# Patient Record
Sex: Female | Born: 1970 | Race: White | Hispanic: No | Smoking: Current every day smoker
Health system: Southern US, Community
[De-identification: ages and names within clinical notes are randomized; demographics above are authoritative.]

## PROBLEM LIST (undated history)

## (undated) DIAGNOSIS — F1110 Opioid abuse, uncomplicated: Secondary | ICD-10-CM

## (undated) DIAGNOSIS — J40 Bronchitis, not specified as acute or chronic: Secondary | ICD-10-CM

## (undated) DIAGNOSIS — B1920 Unspecified viral hepatitis C without hepatic coma: Secondary | ICD-10-CM

---

## 2000-02-08 ENCOUNTER — Emergency Department (HOSPITAL_COMMUNITY): Admission: EM | Admit: 2000-02-08 | Discharge: 2000-02-08 | Payer: Self-pay | Admitting: *Deleted

## 2000-02-20 ENCOUNTER — Emergency Department (HOSPITAL_COMMUNITY): Admission: EM | Admit: 2000-02-20 | Discharge: 2000-02-20 | Payer: Self-pay | Admitting: Emergency Medicine

## 2000-09-12 ENCOUNTER — Emergency Department (HOSPITAL_COMMUNITY): Admission: EM | Admit: 2000-09-12 | Discharge: 2000-09-12 | Payer: Self-pay | Admitting: Emergency Medicine

## 2000-09-12 ENCOUNTER — Encounter: Payer: Self-pay | Admitting: Emergency Medicine

## 2001-09-19 ENCOUNTER — Encounter: Payer: Self-pay | Admitting: Emergency Medicine

## 2001-09-19 ENCOUNTER — Emergency Department (HOSPITAL_COMMUNITY): Admission: EM | Admit: 2001-09-19 | Discharge: 2001-09-19 | Payer: Self-pay | Admitting: Emergency Medicine

## 2002-06-03 ENCOUNTER — Emergency Department (HOSPITAL_COMMUNITY): Admission: EM | Admit: 2002-06-03 | Discharge: 2002-06-03 | Payer: Self-pay | Admitting: Emergency Medicine

## 2002-06-13 ENCOUNTER — Emergency Department (HOSPITAL_COMMUNITY): Admission: EM | Admit: 2002-06-13 | Discharge: 2002-06-13 | Payer: Self-pay | Admitting: Emergency Medicine

## 2005-07-04 ENCOUNTER — Inpatient Hospital Stay (HOSPITAL_COMMUNITY): Admission: AD | Admit: 2005-07-04 | Discharge: 2005-07-04 | Payer: Self-pay | Admitting: Obstetrics and Gynecology

## 2005-09-09 ENCOUNTER — Emergency Department (HOSPITAL_COMMUNITY): Admission: EM | Admit: 2005-09-09 | Discharge: 2005-09-09 | Payer: Self-pay | Admitting: Emergency Medicine

## 2005-10-15 ENCOUNTER — Emergency Department (HOSPITAL_COMMUNITY): Admission: EM | Admit: 2005-10-15 | Discharge: 2005-10-15 | Payer: Self-pay | Admitting: Emergency Medicine

## 2006-03-30 ENCOUNTER — Emergency Department (HOSPITAL_COMMUNITY): Admission: EM | Admit: 2006-03-30 | Discharge: 2006-03-30 | Payer: Self-pay | Admitting: Emergency Medicine

## 2006-04-01 ENCOUNTER — Emergency Department (HOSPITAL_COMMUNITY): Admission: EM | Admit: 2006-04-01 | Discharge: 2006-04-01 | Payer: Self-pay | Admitting: Emergency Medicine

## 2006-04-04 ENCOUNTER — Emergency Department (HOSPITAL_COMMUNITY): Admission: EM | Admit: 2006-04-04 | Discharge: 2006-04-04 | Payer: Self-pay | Admitting: Family Medicine

## 2006-05-18 ENCOUNTER — Emergency Department (HOSPITAL_COMMUNITY): Admission: EM | Admit: 2006-05-18 | Discharge: 2006-05-18 | Payer: Self-pay | Admitting: Emergency Medicine

## 2006-08-26 ENCOUNTER — Emergency Department (HOSPITAL_COMMUNITY): Admission: EM | Admit: 2006-08-26 | Discharge: 2006-08-27 | Payer: Self-pay | Admitting: *Deleted

## 2007-01-14 ENCOUNTER — Emergency Department (HOSPITAL_COMMUNITY): Admission: EM | Admit: 2007-01-14 | Discharge: 2007-01-14 | Payer: Self-pay | Admitting: *Deleted

## 2007-02-05 ENCOUNTER — Emergency Department (HOSPITAL_COMMUNITY): Admission: EM | Admit: 2007-02-05 | Discharge: 2007-02-05 | Payer: Self-pay | Admitting: Emergency Medicine

## 2008-06-21 ENCOUNTER — Emergency Department (HOSPITAL_COMMUNITY): Admission: EM | Admit: 2008-06-21 | Discharge: 2008-06-21 | Payer: Self-pay | Admitting: Emergency Medicine

## 2009-03-09 ENCOUNTER — Emergency Department (HOSPITAL_COMMUNITY): Admission: EM | Admit: 2009-03-09 | Discharge: 2009-03-09 | Payer: Self-pay | Admitting: Emergency Medicine

## 2010-03-28 ENCOUNTER — Emergency Department (HOSPITAL_COMMUNITY)
Admission: EM | Admit: 2010-03-28 | Discharge: 2010-03-28 | Disposition: A | Discharge status: Home / Self Care | Payer: Self-pay | Attending: Emergency Medicine | Admitting: Emergency Medicine

## 2010-03-28 DIAGNOSIS — L989 Disorder of the skin and subcutaneous tissue, unspecified: Secondary | ICD-10-CM | POA: Insufficient documentation

## 2010-03-28 DIAGNOSIS — Z8619 Personal history of other infectious and parasitic diseases: Secondary | ICD-10-CM | POA: Insufficient documentation

## 2010-04-09 LAB — PORPHYRINS, FRACTIONATION-PLASMA

## 2010-04-12 ENCOUNTER — Emergency Department (HOSPITAL_COMMUNITY)
Admission: EM | Admit: 2010-04-12 | Discharge: 2010-04-14 | Disposition: A | Discharge status: Home / Self Care | Payer: Self-pay | Attending: Emergency Medicine | Admitting: Emergency Medicine

## 2010-04-12 DIAGNOSIS — L989 Disorder of the skin and subcutaneous tissue, unspecified: Secondary | ICD-10-CM | POA: Insufficient documentation

## 2010-04-12 DIAGNOSIS — F1910 Other psychoactive substance abuse, uncomplicated: Secondary | ICD-10-CM | POA: Insufficient documentation

## 2010-04-12 DIAGNOSIS — Z8619 Personal history of other infectious and parasitic diseases: Secondary | ICD-10-CM | POA: Insufficient documentation

## 2010-04-12 LAB — DIFFERENTIAL
Basophils Relative: 1 % (ref 0–1)
Eosinophils Absolute: 0.2 10*3/uL (ref 0.0–0.7)
Lymphs Abs: 2.6 10*3/uL (ref 0.7–4.0)
Neutro Abs: 3.5 10*3/uL (ref 1.7–7.7)
Neutrophils Relative %: 49 % (ref 43–77)

## 2010-04-12 LAB — URINALYSIS, ROUTINE W REFLEX MICROSCOPIC
Bilirubin Urine: NEGATIVE
Ketones, ur: NEGATIVE mg/dL
Nitrite: NEGATIVE
Protein, ur: NEGATIVE mg/dL
pH: 6 (ref 5.0–8.0)

## 2010-04-12 LAB — CBC
Hemoglobin: 13.5 g/dL (ref 12.0–15.0)
MCV: 91 fL (ref 78.0–100.0)
Platelets: 361 10*3/uL (ref 150–400)
RBC: 4.57 MIL/uL (ref 3.87–5.11)
WBC: 7.1 10*3/uL (ref 4.0–10.5)

## 2010-04-12 LAB — COMPREHENSIVE METABOLIC PANEL
AST: 21 U/L (ref 0–37)
Albumin: 3.7 g/dL (ref 3.5–5.2)
Alkaline Phosphatase: 68 U/L (ref 39–117)
CO2: 31 mEq/L (ref 19–32)
Chloride: 102 mEq/L (ref 96–112)
GFR calc Af Amer: >60 mL/min (ref >60)
Potassium: 3.7 mEq/L (ref 3.5–5.1)
Total Bilirubin: 0.2 mg/dL — ABNORMAL LOW (ref 0.3–1.2)

## 2010-04-12 LAB — RAPID URINE DRUG SCREEN, HOSP PERFORMED
Cocaine: POSITIVE — AB
Tetrahydrocannabinol: POSITIVE — AB

## 2010-04-13 DIAGNOSIS — F1110 Opioid abuse, uncomplicated: Secondary | ICD-10-CM

## 2010-04-14 DIAGNOSIS — F1910 Other psychoactive substance abuse, uncomplicated: Secondary | ICD-10-CM

## 2010-04-14 NOTE — Consult Note (Signed)
  Christy Barnett, Christy Barnett NO.:  [id number]  MEDICAL RECORD NO.:  [id number]           PATIENT TYPE:  E  LOCATION:  WLED                         FACILITY:  [facility]  PHYSICIAN:  Anselm Jungling, MD  DATE OF BIRTH:  August 13, 1970  DATE OF CONSULTATION:  04/13/2010 DATE OF DISCHARGE:                                CONSULTATION   IDENTIFYING DATA AND REASON FOR EVALUATION:  The patient is a 40 year old homeless Caucasian female who presents to the emergency department requesting treatment for opiate dependence and polysubstance abuse.  HISTORY OF PRESENTING PROBLEMS:  The patient indicates that she is currently homeless.  She was released from prison in January and has been using heroin daily since.  She was going to a methadone clinic but was using heroin at the same time, and therefore apparently was dismissed.  She states that her last use of any opiate was 2 days ago. She is in withdrawal at this time.  Urine drug screen was also positive for marijuana and cocaine.  She apparently has a long history, all of her adult life, of substance dependence.  She has had various efforts at treatment in the past.  Our assessment team has tried to place her at [facility], but they have no beds available right now.  [facility] was also called but they not returned our calls.  MEDICAL HISTORY:  See emergency department notes.  MENTAL STATUS:  The patient is a well-nourished, normally-developed adult female who appears older than her chronological age.  She is disheveled and looks extremely unhealthy with discolored facial skin, appearing chronically ill.  She is alert, fully oriented and in full contact with reality.  She indicates that she does want treatment for heroin dependence.  She is agreeable to remaining in an inpatient setting to accomplish this.  IMPRESSION:  AXIS I:  Opiate dependence, polysubstance abuse. AXIS II:  Deferred. AXIS III:  See medical notes. AXIS IV:   Stressors, severe. AXIS V:  Global assessment of functioning, 40.  RECOMMENDATIONS:  The patient needs opiate detox.  At this point it is not clear where this can be accomplished.  [facility] has no beds available today.  And we are waiting from [facility] as described above.  She has been turned down by [facility].  For now, we will initiate the clonidine withdrawal protocol while we attempt to find an appropriate program for her elsewhere.     Anselm Jungling, MD     SPB/MEDQ  D:  04/13/2010  T:  04/13/2010  Job:  [id number]  Electronically Signed by Geralyn Flash MD on 04/14/2010 11:37:35 AM

## 2010-06-22 ENCOUNTER — Emergency Department (HOSPITAL_COMMUNITY)
Admission: EM | Admit: 2010-06-22 | Discharge: 2010-06-23 | Discharge status: Against Medical Advice | Payer: Self-pay | Attending: Emergency Medicine | Admitting: Emergency Medicine

## 2010-06-22 DIAGNOSIS — N61 Mastitis without abscess: Secondary | ICD-10-CM | POA: Insufficient documentation

## 2010-06-22 DIAGNOSIS — Z8619 Personal history of other infectious and parasitic diseases: Secondary | ICD-10-CM | POA: Insufficient documentation

## 2010-06-22 DIAGNOSIS — R509 Fever, unspecified: Secondary | ICD-10-CM | POA: Insufficient documentation

## 2010-06-22 LAB — DIFFERENTIAL
Basophils Relative: 1 % (ref 0–1)
Eosinophils Absolute: 0.1 10*3/uL (ref 0.0–0.7)
Eosinophils Relative: 2 % (ref 0–5)
Monocytes Absolute: 0.8 10*3/uL (ref 0.1–1.0)
Monocytes Relative: 11 % (ref 3–12)

## 2010-06-22 LAB — CBC
MCH: 29.3 pg (ref 26.0–34.0)
MCHC: 35 g/dL (ref 30.0–36.0)
Platelets: 319 10*3/uL (ref 150–400)
RDW: 12.8 % (ref 11.5–15.5)

## 2010-06-23 LAB — BASIC METABOLIC PANEL
CO2: 26 mEq/L (ref 19–32)
Calcium: 9.2 mg/dL (ref 8.4–10.5)
Creatinine, Ser: 0.79 mg/dL (ref 0.4–1.2)
GFR calc Af Amer: >60 mL/min (ref >60)

## 2010-06-25 LAB — WOUND CULTURE

## 2010-07-29 ENCOUNTER — Emergency Department (HOSPITAL_COMMUNITY)
Admission: EM | Admit: 2010-07-29 | Discharge: 2010-07-30 | Disposition: A | Discharge status: Other Acute Inpatient Hospital | Payer: Self-pay | Attending: Emergency Medicine | Admitting: Emergency Medicine

## 2010-07-29 DIAGNOSIS — F1910 Other psychoactive substance abuse, uncomplicated: Secondary | ICD-10-CM | POA: Insufficient documentation

## 2010-07-29 DIAGNOSIS — Z8619 Personal history of other infectious and parasitic diseases: Secondary | ICD-10-CM | POA: Insufficient documentation

## 2010-07-29 LAB — URINALYSIS, ROUTINE W REFLEX MICROSCOPIC
Glucose, UA: NEGATIVE mg/dL
Hgb urine dipstick: NEGATIVE
Specific Gravity, Urine: 1.028 (ref 1.005–1.030)
pH: 6 (ref 5.0–8.0)

## 2010-07-29 LAB — CBC
HCT: 40.6 % (ref 36.0–46.0)
Hemoglobin: 13.4 g/dL (ref 12.0–15.0)
MCV: 86.4 fL (ref 78.0–100.0)
RBC: 4.7 MIL/uL (ref 3.87–5.11)
WBC: 6.5 10*3/uL (ref 4.0–10.5)

## 2010-07-29 LAB — URINE MICROSCOPIC-ADD ON

## 2010-07-29 LAB — BASIC METABOLIC PANEL
BUN: 7 mg/dL (ref 6–23)
Chloride: 98 mEq/L (ref 96–112)
GFR calc Af Amer: >60 mL/min (ref >60)
Potassium: 3.7 mEq/L (ref 3.5–5.1)

## 2010-07-29 LAB — DIFFERENTIAL
Eosinophils Relative: 3 % (ref 0–5)
Lymphocytes Relative: 44 % (ref 12–46)
Lymphs Abs: 2.8 10*3/uL (ref 0.7–4.0)
Neutrophils Relative %: 45 % (ref 43–77)

## 2010-07-29 LAB — RAPID URINE DRUG SCREEN, HOSP PERFORMED: Opiates: POSITIVE — AB

## 2010-07-30 ENCOUNTER — Inpatient Hospital Stay (HOSPITAL_COMMUNITY)
Admission: AD | Admit: 2010-07-30 | Discharge: 2010-08-01 | DRG: 897 | Disposition: A | Discharge status: Home / Self Care | Payer: PRIVATE HEALTH INSURANCE | Source: Ambulatory Visit | Attending: Psychiatry | Admitting: Psychiatry

## 2010-07-30 DIAGNOSIS — B1920 Unspecified viral hepatitis C without hepatic coma: Secondary | ICD-10-CM

## 2010-07-30 DIAGNOSIS — Z5900 Homelessness unspecified: Secondary | ICD-10-CM

## 2010-07-30 DIAGNOSIS — F1120 Opioid dependence, uncomplicated: Principal | ICD-10-CM

## 2010-07-30 DIAGNOSIS — Z560 Unemployment, unspecified: Secondary | ICD-10-CM

## 2010-07-30 DIAGNOSIS — F1910 Other psychoactive substance abuse, uncomplicated: Secondary | ICD-10-CM

## 2010-07-31 DIAGNOSIS — F1120 Opioid dependence, uncomplicated: Secondary | ICD-10-CM

## 2010-08-14 NOTE — Assessment & Plan Note (Signed)
Christy Barnett, ANDY NO.:  [id number]  MEDICAL RECORD NO.:  [id number]  LOCATION:  0306                          FACILITY:  [facility]  PHYSICIAN:  Vic Ripper, P.A.-C.DATE OF BIRTH:  December 14, 1970  DATE OF ADMISSION:  07/30/2010 DATE OF DISCHARGE:                      PSYCHIATRIC ADMISSION ASSESSMENT   This is a voluntary admission to the services of Dr. Harvie Heck Jolynne Spurgin. This is a 40 year old widowed white female.  She presented to the ED at [facility].  She reported that she was coming off of heroin.  She was complaining about stomach cramps, diarrhea, chills, body aches.  She stated the last time she had use was around noon.  She presented on the tenth, this would have been about 8 o'clock at night, and states that she has been using heroin for the past 2 years.  She states that she has not had any formal detox, she has been in jail and has inadvertently detoxed.  SOCIAL HISTORY:  She is a high Garment/textile technologist.  She was married once, she is widowed.  She has three children; two sons 43 and 30, a 65 year old daughter.  She has not been employed for years.  She got started on drugs via her boyfriend who also supports her.  She is currently homeless, I am not quite sure where he is at the moment.  FAMILY HISTORY:  She denies.  PRIMARY CARE PROVIDER:  She does not have one.  PSYCHIATRY:  She does not have any.  MEDICAL PROBLEMS:  She reports that she has been diagnosed with hep C.  MEDICATIONS:  None are prescribed.  DRUG ALLERGIES:  None.  POSITIVE PHYSICAL FINDINGS:  This is a 40 year old female who appears older than her stated age who is in distress.  Her vital signs showed that her temperature ranged from 97.8-99, her blood pressure from 93/64 to 123/75, pulse was 70-98 and respirations were 16-20.  Her UA had many bacteria.  Otherwise no remarkable findings.  Her glucose was slightly elevated at 108.  She had no measurable alcohol.  She had  no abnormalities on CBC and her UDS was positive for opiates, cocaine and marijuana.  Originally she was told that she was up for discharge as there were no female detox beds, she was going to be discharged with referral numbers for detox.  She became very angry, "I am not leaving here.  I need help, I need detox."  it is not printable what she actually said.  "I am sick and I am leaving, I am going to stab myself to prove you wrong," at which point she was brought over to the psych ED.  She stated that she has money in her purse to stay at a hotel, that the only reason she was homeless was because her apartment building was condemned.  She also told the nurse tech that she did have a room at [employer], but she did not want to stay there because it was "dirty.".  MENTAL STATUS EXAM:  She is alert.  She can be alert.  She looks unkempt, disheveled.  Her speech varies.  Her mood is very irritable, anxious.  Her thought processes are somewhat clear, rational and goal  oriented.  She realizes that she is detoxing, but she does not want to have any of the symptoms.  She is not suicidal or homicidal.  She is not having auditory or visual hallucinations.  Her intelligence is average.  DIAGNOSES:  Axis I:  Opioid dependence, polysubstance abuse. Axis II:  Deferred. Axis III:  Hepatitis C by her report, not verified. Axis IV:  Problems with primary support group, occupation, housing, economic issues.  She was recently in jail for 3 months after stealing something.  She has upcoming court date, she is not sure exactly when for stealing and prostitution. Axis V:  45.  PLAN:  Is to admit for safety and stabilization.  She will be medically supported through use of the clonidine protocol through her withdraw from opioids.  We will test her for STDs since her upcoming charges include prostitution. Case managers will work with her regarding further substance abuse treatment once  stabilized.  Estimated length of stay is 5 days.     Vic Ripper, P.A.-C.     MD/MEDQ  D:  07/30/2010  T:  07/31/2010  Job:  [id number]  Electronically Signed by Jaci Lazier ADAMS P.A.-C. on 08/07/2010 06:42:09 PM Electronically Signed by Franchot Gallo MD on 08/14/2010 05:13:17 PM

## 2010-08-26 NOTE — Discharge Summary (Signed)
  NAMESHAKIERA, EDELSON NO.:  [id number]  MEDICAL RECORD NO.:  [id number]  LOCATION:  0306                          [facility]ACILITY:  [facility]  PHYSICIAN:  Orson Aloe, MD       DATE OF BIRTH:  1970-09-24  DATE OF ADMISSION:  07/30/2010 DATE OF DISCHARGE:  08/01/2010                              DISCHARGE SUMMARY   IDENTIFYING INFORMATION:  This is a 40 year old Caucasian female.  This is a voluntary admission.  HISTORY OF PRESENT ILLNESS:  This is the first [facility] admission for Kirti who presented requesting help with detoxing from heroin.  She presented in full detox complaining about abdominal cramping, diarrhea, chills and myalgias.  She expressed intent to stab herself if she were discharged from the emergency room which initially was a possibility due to lack of hospital beds.  She was eventually transferred to [facility] for ongoing treatment.  MEDICAL EVALUATION:  She was medically evaluated in the emergency room, found to have normal vital signs, afebrile.  Alcohol screen negative. CBC normal.  Urine drug screen positive for opiates, cocaine and marijuana.  COURSE OF HOSPITALIZATION:  She was admitted to our dual diagnosis unit and started on a clonidine detox protocol for management of her withdrawal symptoms..  She was evaluated by Dr. Orson Aloe.  She requested to be discharged in order to go with her sister to [place] who had custody of a granddaughter.  She clearly and convincingly denied any suicidal thoughts and was felt to be at minimal risk for suicide.  DISCHARGE PLAN:  She is to follow up with [facility] in [place], [place], and she was given the contact information to arrange this.  DISCHARGE MEDICATIONS:  None.     Margaret A. Lorin Picket, N.P.   ______________________________ Orson Aloe, MD    MAS/MEDQ  D:  08/25/2010  T:  08/25/2010  Job:  [id number]  Electronically Signed by Kari Baars  N.P. on 08/25/2010 02:40:47 PM Electronically Signed by Orson Aloe  on 08/26/2010 07:55:57 AM

## 2010-09-17 ENCOUNTER — Emergency Department (HOSPITAL_COMMUNITY): Payer: No Typology Code available for payment source

## 2010-09-17 ENCOUNTER — Emergency Department (HOSPITAL_COMMUNITY)
Admission: EM | Admit: 2010-09-17 | Discharge: 2010-09-17 | Disposition: A | Discharge status: Home / Self Care | Payer: No Typology Code available for payment source | Attending: Emergency Medicine | Admitting: Emergency Medicine

## 2010-09-17 DIAGNOSIS — R079 Chest pain, unspecified: Secondary | ICD-10-CM | POA: Insufficient documentation

## 2010-09-17 DIAGNOSIS — R0602 Shortness of breath: Secondary | ICD-10-CM | POA: Insufficient documentation

## 2010-09-17 DIAGNOSIS — S2249XA Multiple fractures of ribs, unspecified side, initial encounter for closed fracture: Secondary | ICD-10-CM | POA: Insufficient documentation

## 2010-09-17 LAB — URINALYSIS, ROUTINE W REFLEX MICROSCOPIC
Glucose, UA: NEGATIVE mg/dL
Leukocytes, UA: NEGATIVE
Nitrite: NEGATIVE
Protein, ur: NEGATIVE mg/dL
pH: 6 (ref 5.0–8.0)

## 2010-09-17 LAB — RAPID URINE DRUG SCREEN, HOSP PERFORMED
Barbiturates: NOT DETECTED
Cocaine: NOT DETECTED
Tetrahydrocannabinol: NOT DETECTED

## 2010-09-17 LAB — POCT PREGNANCY, URINE: Preg Test, Ur: NEGATIVE

## 2010-10-24 LAB — I-STAT 8, (EC8 V) (CONVERTED LAB)
BUN: 4 — ABNORMAL LOW
Bicarbonate: 24.4 — ABNORMAL HIGH
HCT: 39
Hemoglobin: 13.3
Operator id: 192351
Sodium: 137
TCO2: 26

## 2010-10-24 LAB — POCT I-STAT CREATININE: Operator id: 192351

## 2010-10-24 LAB — CBC
Hemoglobin: 12.4
RDW: 13.7
WBC: 9.4

## 2010-10-24 LAB — DIFFERENTIAL
Basophils Absolute: 0.1
Lymphocytes Relative: 31
Lymphs Abs: 3
Monocytes Absolute: 0.9
Neutro Abs: 4.8

## 2010-12-25 ENCOUNTER — Emergency Department (HOSPITAL_COMMUNITY)
Admission: EM | Admit: 2010-12-25 | Discharge: 2010-12-25 | Disposition: A | Discharge status: Home / Self Care | Payer: No Typology Code available for payment source | Attending: Emergency Medicine | Admitting: Emergency Medicine

## 2010-12-25 ENCOUNTER — Encounter: Payer: Self-pay | Admitting: *Deleted

## 2010-12-25 DIAGNOSIS — F17200 Nicotine dependence, unspecified, uncomplicated: Secondary | ICD-10-CM | POA: Insufficient documentation

## 2010-12-25 DIAGNOSIS — R21 Rash and other nonspecific skin eruption: Secondary | ICD-10-CM | POA: Insufficient documentation

## 2010-12-25 DIAGNOSIS — R234 Changes in skin texture: Secondary | ICD-10-CM | POA: Insufficient documentation

## 2010-12-25 MED ORDER — HYDROXYZINE HCL 25 MG PO TABS: 25.0000 mg | ORAL_TABLET | Freq: Four times a day (QID) | ORAL | Status: AC | PRN

## 2010-12-25 MED ORDER — CEPHALEXIN 500 MG PO CAPS: 500.0000 mg | ORAL_CAPSULE | Freq: Four times a day (QID) | ORAL | Status: AC

## 2010-12-25 NOTE — ED Notes (Addendum)
Pt reports rash around her neck x 3 days.  Pt reports burning pain and itching.  Area around her neck appears red and swollen.  Pt reports the area is warm to touch.  Pt reports "leaking" clear "apple juice" looking drainage.  Area also appears dry.

## 2010-12-31 NOTE — ED Provider Notes (Signed)
History    40yf with rash on neck. Gradual onset 3d ago. Very itchy. No new exposures that she is aware of. No fever or chills. No difficulty breathing or swallowing. No facial or oral swelling. No hx of similar. No contacts with similar.   CSN: [id number] Arrival date & time: 12/25/2010  4:56 PM   First MD Initiated Contact with Patient 12/25/10 1821      Chief Complaint  Patient presents with  . Rash    red and inflammed    (Consider location/radiation/quality/duration/timing/severity/associated sxs/prior treatment) HPI  History reviewed. No pertinent past medical history.  History reviewed. No pertinent past surgical history.  No family history on file.  History  Substance Use Topics  . Smoking status: Current Everyday Smoker -- 1.0 packs/day    Types: Cigarettes  . Smokeless tobacco: Not on file  . Alcohol Use: No    OB History    Grav Para Term Preterm Abortions TAB SAB Ect Mult Living                  Review of Systems   Review of symptoms negative unless otherwise noted in HPI.   Allergies  Review of patient's allergies indicates no known allergies.  Home Medications   Current Outpatient Rx  Name Route Sig Dispense Refill  . CEPHALEXIN 500 MG PO CAPS Oral Take 1 capsule (500 mg total) by mouth 4 (four) times daily. 28 capsule 0  . HYDROXYZINE HCL 25 MG PO TABS Oral Take 1 tablet (25 mg total) by mouth every 6 (six) hours as needed for itching. 12 tablet 0    BP 143/87  Pulse 92  Temp(Src) 98.8 F (37.1 C) (Oral)  Resp 20  SpO2 99%  LMP 12/14/2010  Physical Exam  Nursing note and vitals reviewed. Constitutional: She appears well-developed and well-nourished. No distress.  HENT:  Head: Normocephalic and atraumatic.  Eyes: Conjunctivae are normal. Pupils are equal, round, and reactive to light. Right eye exhibits no discharge. Left eye exhibits no discharge.  Neck: Normal range of motion. Neck supple. No tracheal deviation present. No  thyromegaly present.  Cardiovascular: Normal rate, regular rhythm and normal heart sounds.  Exam reveals no gallop and no friction rub.   No murmur heard. Pulmonary/Chest: Effort normal and breath sounds normal. No respiratory distress.  Abdominal: Soft. She exhibits no distension. There is no tenderness.  Musculoskeletal: She exhibits no edema and no tenderness.  Lymphadenopathy:    She has no cervical adenopathy.  Neurological: She is alert.  Skin:       Dry, thickened skin circumfrentially around neck. Does not extender below neck line or above scalp line. Extends up to bottom portion of ears but not onto face. Did not appreciate any weeping or other drainage. Faint erythema. Mild tenderness.   Psychiatric: She has a normal mood and affect. Her behavior is normal. Thought content normal.    ED Course  Procedures (including critical care time)  Labs Reviewed - No data to display No results found.   1. Rash and nonspecific skin eruption       MDM  40yf with nonspecific rash in exposed area of neck. Appearance of dry thickened skin with minimal erythema. Not clinically convinced of cellulitis but will give course abx to cover for possible. No respiratory distress. No new exposures that pt is aware of. Does admit to occasional cocaine use but does not appear to be rash related to levimasole. outpt fu.  Raeford Razor, MD 12/31/10 [phone]

## 2011-02-19 ENCOUNTER — Encounter (HOSPITAL_COMMUNITY): Payer: Self-pay | Admitting: Emergency Medicine

## 2011-02-19 ENCOUNTER — Emergency Department (HOSPITAL_COMMUNITY)
Admission: EM | Admit: 2011-02-19 | Discharge: 2011-02-19 | Disposition: A | Discharge status: Home / Self Care | Payer: Self-pay | Attending: Emergency Medicine | Admitting: Emergency Medicine

## 2011-02-19 DIAGNOSIS — F17200 Nicotine dependence, unspecified, uncomplicated: Secondary | ICD-10-CM | POA: Insufficient documentation

## 2011-02-19 DIAGNOSIS — L0291 Cutaneous abscess, unspecified: Secondary | ICD-10-CM

## 2011-02-19 DIAGNOSIS — R21 Rash and other nonspecific skin eruption: Secondary | ICD-10-CM | POA: Insufficient documentation

## 2011-02-19 DIAGNOSIS — R609 Edema, unspecified: Secondary | ICD-10-CM | POA: Insufficient documentation

## 2011-02-19 DIAGNOSIS — L02419 Cutaneous abscess of limb, unspecified: Secondary | ICD-10-CM | POA: Insufficient documentation

## 2011-02-19 MED ORDER — LIDOCAINE-EPINEPHRINE (PF) 1 %-1:200000 IJ SOLN
INTRAMUSCULAR | Status: AC
  Administered 2011-02-19: 30 mL via INTRADERMAL
  Filled 2011-02-19: qty 10

## 2011-02-19 MED ORDER — CEPHALEXIN 500 MG PO CAPS: 500.0000 mg | ORAL_CAPSULE | Freq: Four times a day (QID) | ORAL | Status: AC

## 2011-02-19 MED ORDER — LIDOCAINE-EPINEPHRINE 1 %-1:100000 IJ SOLN: 30.0000 mL | Freq: Once | INTRAMUSCULAR | Status: DC

## 2011-02-19 MED ORDER — LIDOCAINE-EPINEPHRINE (PF) 1 %-1:200000 IJ SOLN
30.0000 mL | Freq: Once | INTRAMUSCULAR | Status: AC
  Administered 2011-02-19: 30 mL via INTRADERMAL

## 2011-02-19 NOTE — ED Notes (Signed)
Per pt she was trying to find vein to shoot up in-today noticed leg swelling, red/ warm to touch around raised area

## 2011-02-19 NOTE — ED Provider Notes (Cosign Needed)
History     CSN: [id number]  Arrival date & time 02/19/11  1543   First MD Initiated Contact with Patient 02/19/11 1556      Chief Complaint  Patient presents with  . Leg Swelling    (Consider location/radiation/quality/duration/timing/severity/associated sxs/prior treatment) The history is provided by the patient and medical records.   the patient is a 41 year old, female, who uses hair when.  She presents to emergency department complaining of left anterior lower leg pain and swelling over the past 3 days.  She says that she has not used heroin for several months until 3 days ago and then she tried to inject here.  When into her lower extremity, with a dirty needle.  She denies fevers, chills, nausea or vomiting.  She denies chest pain, cough, and shortness of breath.  She smokes cigarettes and drinks alcohol.  History reviewed. No pertinent past medical history.  History reviewed. No pertinent past surgical history.  No family history on file.  History  Substance Use Topics  . Smoking status: Current Everyday Smoker -- 1.0 packs/day    Types: Cigarettes  . Smokeless tobacco: Not on file  . Alcohol Use: No    OB History    Grav Para Term Preterm Abortions TAB SAB Ect Mult Living                  Review of Systems  Constitutional: Negative for fever and chills.  Respiratory: Negative for cough and shortness of breath.   Cardiovascular: Negative for chest pain.  Gastrointestinal: Negative for nausea and vomiting.  Musculoskeletal:       Left lower extremity pain  Skin: Positive for rash.       Left anterior shin swelling and redness and pain   Neurological: Negative for dizziness and headaches.  Psychiatric/Behavioral: Negative for confusion.  All other systems reviewed and are negative.    Allergies  Review of patient's allergies indicates no known allergies.  Home Medications  No current outpatient prescriptions on file.  BP 118/61  Pulse 75  Temp(Src)  98 F (36.7 C) (Oral)  Resp 18  SpO2 100%  LMP 01/18/2011  Physical Exam  Constitutional: She is oriented to person, place, and time. She appears well-developed and well-nourished.  HENT:  Head: Normocephalic and atraumatic.  Eyes: Pupils are equal, round, and reactive to light.  Neck: Normal range of motion.  Cardiovascular: Normal rate.   No murmur heard. Pulmonary/Chest: Effort normal. No respiratory distress.  Abdominal: She exhibits no distension.  Musculoskeletal: Normal range of motion. She exhibits edema and tenderness.  Neurological: She is alert and oriented to person, place, and time.  Skin: Skin is warm and dry. There is erythema.       Left anterior shin has approximately 7 x 4 cm erythematous area with 2 cm, tender, fluctuant abscess in the center  Psychiatric: She has a normal mood and affect.    ED Course  INCISION AND DRAINAGE Date/Time: 02/19/2011 5:13 PM Performed by: Weldon Inches, Sandy Haye P Authorized by: Weldon Inches, Kiyla Ringler P Consent: Verbal consent obtained. Risks and benefits: risks, benefits and alternatives were discussed Consent given by: patient Patient understanding: patient states understanding of the procedure being performed Type: abscess Body area: lower extremity Location details: left leg Anesthesia: local infiltration Local anesthetic: lidocaine 1% with epinephrine Anesthetic total: 2 ml Patient sedated: no Scalpel size: 10 Needle gauge: 22 Incision type: single straight Complexity: simple Drainage: purulent Drainage amount: moderate Wound treatment: wound left open Packing material: none Patient  tolerance: Patient tolerated the procedure well with no immediate complications.   (including critical care time) 41 year old heroin user, present with abscess on the left anterior shin without signs of systemic infection  Labs Reviewed - No data to display No results found.   No diagnosis found.    MDM  Left anterior shin abscess.   Following injection of IV heroin.  No systemic symptoms.  No cardiac murmur or shortness of breath or chest pain. Abscess drained in ed. No complications.       Nicholes Stairs, MD 02/19/11 1715

## 2011-03-25 ENCOUNTER — Emergency Department (HOSPITAL_COMMUNITY): Payer: Self-pay

## 2011-03-25 ENCOUNTER — Emergency Department (HOSPITAL_COMMUNITY)
Admission: EM | Admit: 2011-03-25 | Discharge: 2011-03-25 | Disposition: A | Discharge status: Home / Self Care | Payer: Self-pay | Attending: Emergency Medicine | Admitting: Emergency Medicine

## 2011-03-25 ENCOUNTER — Encounter (HOSPITAL_COMMUNITY): Payer: Self-pay | Admitting: *Deleted

## 2011-03-25 DIAGNOSIS — R229 Localized swelling, mass and lump, unspecified: Secondary | ICD-10-CM | POA: Insufficient documentation

## 2011-03-25 DIAGNOSIS — M7989 Other specified soft tissue disorders: Secondary | ICD-10-CM | POA: Insufficient documentation

## 2011-03-25 DIAGNOSIS — M79609 Pain in unspecified limb: Secondary | ICD-10-CM | POA: Insufficient documentation

## 2011-03-25 DIAGNOSIS — R609 Edema, unspecified: Secondary | ICD-10-CM | POA: Insufficient documentation

## 2011-03-25 DIAGNOSIS — R5383 Other fatigue: Secondary | ICD-10-CM | POA: Insufficient documentation

## 2011-03-25 DIAGNOSIS — R5381 Other malaise: Secondary | ICD-10-CM | POA: Insufficient documentation

## 2011-03-25 DIAGNOSIS — R0602 Shortness of breath: Secondary | ICD-10-CM | POA: Insufficient documentation

## 2011-03-25 DIAGNOSIS — M79604 Pain in right leg: Secondary | ICD-10-CM

## 2011-03-25 DIAGNOSIS — F17200 Nicotine dependence, unspecified, uncomplicated: Secondary | ICD-10-CM | POA: Insufficient documentation

## 2011-03-25 DIAGNOSIS — M79605 Pain in left leg: Secondary | ICD-10-CM

## 2011-03-25 HISTORY — DX: Bronchitis, not specified as acute or chronic: J40

## 2011-03-25 LAB — PRO B NATRIURETIC PEPTIDE: Pro B Natriuretic peptide (BNP): 92.1 pg/mL (ref 0–125)

## 2011-03-25 LAB — CBC
MCHC: 33.3 g/dL (ref 30.0–36.0)
RDW: 13.2 % (ref 11.5–15.5)

## 2011-03-25 LAB — DIFFERENTIAL
Basophils Absolute: 0.1 10*3/uL (ref 0.0–0.1)
Basophils Relative: 1 % (ref 0–1)
Monocytes Absolute: 0.4 10*3/uL (ref 0.1–1.0)
Neutro Abs: 2.8 10*3/uL (ref 1.7–7.7)
Neutrophils Relative %: 46 % (ref 43–77)

## 2011-03-25 LAB — BASIC METABOLIC PANEL
Calcium: 9.4 mg/dL (ref 8.4–10.5)
Creatinine, Ser: 0.74 mg/dL (ref 0.50–1.10)
GFR calc Af Amer: >90 mL/min (ref >90)

## 2011-03-25 MED ORDER — SULFAMETHOXAZOLE-TRIMETHOPRIM 800-160 MG PO TABS: 1.0000 | ORAL_TABLET | Freq: Two times a day (BID) | ORAL | Status: AC

## 2011-03-25 MED ORDER — CEPHALEXIN 250 MG PO CAPS: 250.0000 mg | ORAL_CAPSULE | Freq: Four times a day (QID) | ORAL | Status: AC

## 2011-03-25 NOTE — ED Notes (Signed)
Unable to get all labs. Christy Barnett, [place] aware.

## 2011-03-25 NOTE — ED Notes (Signed)
Phlebotomist from lab notified to draw second set of blood cultures.

## 2011-03-25 NOTE — Discharge Instructions (Signed)
Your lab work and x-ray today were normal. You've been started on antibiotics to treat possible infection. You may use over-the-counter pain medicine as needed. If your swelling is worsening, you have worsening shortness of breath or chest pain, or are having otherwise worsening condition, please return to the ER.  RESOURCE GUIDE  Dental Problems  Patients with Medicaid: [facility] [phone] [address]                                           [phone] [address] Phone:  [phone]                                                  Phone:  [phone]  If unable to pay or uninsured, contact:  [facility] or [facility]. to become qualified for the adult dental clinic.  Chronic Pain Problems Contact [facility]  [phone] Patients need to be referred by their primary care doctor.  Insufficient Money for Medicine Contact [employer]:  call "211" or [facility] 5020332492.  No Primary Care Doctor Call [facility]  6298445322 Other agencies that provide inexpensive medical care    [facility]  5127491296    [facility]  [phone]    [facility]  [phone]    [facility]  [phone]    [facility]  [phone]    [facility]  [phone]  Psychological Services [facility]  [phone] [facility]  [phone] [facility]   8255117055 (emergency services ([phone])  Substance Abuse Resources Alcohol and Drug Services  [phone] [facility] 612-636-2909 [facility] [place] [phone] Floydene Flock [phone] Residential & Outpatient Substance Abuse Program  252-717-1239  Abuse/Neglect [facility] Child Abuse Hotline (269)809-1558 [facility] Child Abuse Hotline 6517531616 (After Hours)  Emergency Shelter [facility] 561-495-5652  Maternity Homes Room at the  [place] [facility] [place] 6174659271 [facility] Services [phone]  MRSA Hotline #:   6238765053    [facility] Resources  [facility]     [employer]                          [facility] Dept. [address]. [place]                       [address]      [address]  [place]                                                Cristobal Goldmann Phone:  [phone]  Phone:  [phone]                 Phone:  [phone]  [place] [facility] Phone:  [phone]  [place] Child Abuse Hotline (336) 342-1394 [phone]) [phone] (After Hours)   
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
## 2011-03-27 NOTE — ED Provider Notes (Signed)
Medical screening examination/treatment/procedure(s) were performed by non-physician practitioner and as supervising physician I was immediately available for consultation/collaboration.  Cyndra Numbers, MD 03/27/11 [phone]

## 2011-04-01 LAB — CULTURE, BLOOD (ROUTINE X 2): Culture: NO GROWTH

## 2014-08-20 ENCOUNTER — Emergency Department (HOSPITAL_COMMUNITY)
Admission: EM | Admit: 2014-08-20 | Discharge: 2014-08-20 | Disposition: A | Discharge status: Home / Self Care | Payer: PRIVATE HEALTH INSURANCE | Attending: Emergency Medicine | Admitting: Emergency Medicine

## 2014-08-20 ENCOUNTER — Encounter (HOSPITAL_COMMUNITY): Payer: Self-pay | Admitting: Emergency Medicine

## 2014-08-20 DIAGNOSIS — H9203 Otalgia, bilateral: Secondary | ICD-10-CM | POA: Insufficient documentation

## 2014-08-20 DIAGNOSIS — Z720 Tobacco use: Secondary | ICD-10-CM | POA: Insufficient documentation

## 2014-08-20 DIAGNOSIS — J45909 Unspecified asthma, uncomplicated: Secondary | ICD-10-CM | POA: Insufficient documentation

## 2014-08-20 DIAGNOSIS — J069 Acute upper respiratory infection, unspecified: Secondary | ICD-10-CM

## 2014-08-20 LAB — RAPID STREP SCREEN (MED CTR MEBANE ONLY): Streptococcus, Group A Screen (Direct): NEGATIVE

## 2014-08-20 MED ORDER — ACETAMINOPHEN 325 MG PO TABS
650.0000 mg | ORAL_TABLET | Freq: Once | ORAL | Status: AC
  Administered 2014-08-20: 650 mg via ORAL
  Filled 2014-08-20: qty 2

## 2014-08-20 MED ORDER — PSEUDOEPHEDRINE HCL ER 120 MG PO TB12: 120.0000 mg | ORAL_TABLET | Freq: Two times a day (BID) | ORAL | Status: DC

## 2014-08-20 NOTE — ED Provider Notes (Signed)
CSN: [id number]     Arrival date & time 08/20/14  1920 History  This chart was scribed for Earley Favor, NP, working with Alvira Monday, MD by Chestine Spore, ED Scribe. The patient was seen in room WTR6/WTR6 at 8:03 PM.     Chief Complaint  Patient presents with  . Sore Throat      The history is provided by the patient. No language interpreter was used.    HPI Comments: Christy Barnett is a 44 y.o. female who presents to the Emergency Department complaining of sore throat onset yesterday. She states that she is having associated symptoms of bilateral ear pain, rhinorrhea, chills. She states that she has tried ibuprofen and theraflu with no relief for her symptoms with her last dose of ibuprofen at 4 PM today. She denies trouble swallowing, fever, and any other symptoms.   Past Medical History  Diagnosis Date  . Bronchitis   . Asthma    Past Surgical History  Procedure Laterality Date  . Cesarean section     History reviewed. No pertinent family history. History  Substance Use Topics  . Smoking status: Current Every Day Smoker -- 1.00 packs/day    Types: Cigarettes  . Smokeless tobacco: Not on file  . Alcohol Use: No   OB History    No data available     Review of Systems  Constitutional: Positive for chills. Negative for fever.  HENT: Positive for ear pain, rhinorrhea and sore throat. Negative for ear discharge and trouble swallowing.       Allergies  Review of patient's allergies indicates no known allergies.  Home Medications   Prior to Admission medications   Medication Sig Start Date End Date Taking? Authorizing Provider  ibuprofen (ADVIL,MOTRIN) 200 MG tablet Take 600 mg by mouth every 6 (six) hours as needed for moderate pain.   Yes Historical Provider, MD  Phenylephrine-Pheniramine-DM [facility] COLD & COUGH PO) Take 30 mLs by mouth daily as needed (cold symptoms).   Yes Historical Provider, MD  pseudoephedrine (SUDAFED 12 HOUR) 120 MG 12 hr tablet Take 1  tablet (120 mg total) by mouth 2 (two) times daily. 08/20/14   Earley Favor, NP   BP 104/56 mmHg  Pulse 87  Temp(Src) 98.2 F (36.8 C) (Oral)  Resp 20  SpO2 99%  LMP 08/13/2014 (Approximate) Physical Exam  Constitutional: She is oriented to person, place, and time. She appears well-developed and well-nourished. No distress.  HENT:  Head: Normocephalic and atraumatic.  Mouth/Throat: Uvula is midline, oropharynx is clear and moist and mucous membranes are normal. No trismus in the jaw. No uvula swelling. No oropharyngeal exudate, posterior oropharyngeal edema or posterior oropharyngeal erythema.  Eyes: EOM are normal.  Neck: Neck supple. No tracheal deviation present.  Cardiovascular: Normal rate.   Pulmonary/Chest: Effort normal. No respiratory distress.  Musculoskeletal: Normal range of motion.  Lymphadenopathy:    She has cervical adenopathy.  Tender mildly swollen anterior cervical lymphnodes.   Neurological: She is alert and oriented to person, place, and time.  Skin: Skin is warm and dry.  Psychiatric: She has a normal mood and affect. Her behavior is normal.  Nursing note and vitals reviewed.   ED Course  Procedures (including critical care time) DIAGNOSTIC STUDIES: Oxygen Saturation is 99% on RA, nl by my interpretation.    COORDINATION OF CARE: 8:05 PM-Discussed treatment plan which includes rapid strep screen with pt at bedside and pt agreed to plan.   Labs Review Labs Reviewed  RAPID STREP  SCREEN (NOT AT [facility])  CULTURE, GROUP A STREP    Imaging Review No results found.   EKG Interpretation None      MDM   Final diagnoses:  URI (upper respiratory infection)      Earley Favor, NP 08/20/14 1610  Alvira Monday, MD 08/21/14 1320

## 2014-08-20 NOTE — ED Notes (Signed)
Patient reports sore throat and bilateral ear pain which began yesterday.  Reports she took ibuprofen at home and theraflu without relief

## 2014-08-20 NOTE — Discharge Instructions (Signed)
Upper Respiratory Infection, Adult An upper respiratory infection (URI) is also sometimes known as the common cold. The upper respiratory tract includes the nose, sinuses, throat, trachea, and bronchi. Bronchi are the airways leading to the lungs. Most people improve within 1 week, but symptoms can last up to 2 weeks. A residual cough may last even longer.  CAUSES Many different viruses can infect the tissues lining the upper respiratory tract. The tissues become irritated and inflamed and often become very moist. Mucus production is also common. A cold is contagious. You can easily spread the virus to others by oral contact. This includes kissing, sharing a glass, coughing, or sneezing. Touching your mouth or nose and then touching a surface, which is then touched by another person, can also spread the virus. SYMPTOMS  Symptoms typically develop 1 to 3 days after you come in contact with a cold virus. Symptoms vary from person to person. They may include:  Runny nose.  Sneezing.  Nasal congestion.  Sinus irritation.  Sore throat.  Loss of voice (laryngitis).  Cough.  Fatigue.  Muscle aches.  Loss of appetite.  Headache.  Low-grade fever. DIAGNOSIS  You might diagnose your own cold based on familiar symptoms, since most people get a cold 2 to 3 times a year. Your caregiver can confirm this based on your exam. Most importantly, your caregiver can check that your symptoms are not due to another disease such as strep throat, sinusitis, pneumonia, asthma, or epiglottitis. Blood tests, throat tests, and X-rays are not necessary to diagnose a common cold, but they may sometimes be helpful in excluding other more serious diseases. Your caregiver will decide if any further tests are required. RISKS AND COMPLICATIONS  You may be at risk for a more severe case of the common cold if you smoke cigarettes, have chronic heart disease (such as heart failure) or lung disease (such as asthma), or if  you have a weakened immune system. The very young and very old are also at risk for more serious infections. Bacterial sinusitis, middle ear infections, and bacterial pneumonia can complicate the common cold. The common cold can worsen asthma and chronic obstructive pulmonary disease (COPD). Sometimes, these complications can require emergency medical care and may be life-threatening. PREVENTION  The best way to protect against getting a cold is to practice good hygiene. Avoid oral or hand contact with people with cold symptoms. Wash your hands often if contact occurs. There is no clear evidence that vitamin C, vitamin E, echinacea, or exercise reduces the chance of developing a cold. However, it is always recommended to get plenty of rest and practice good nutrition. TREATMENT  Treatment is directed at relieving symptoms. There is no cure. Antibiotics are not effective, because the infection is caused by a virus, not by bacteria. Treatment may include:  Increased fluid intake. Sports drinks offer valuable electrolytes, sugars, and fluids.  Breathing heated mist or steam (vaporizer or shower).  Eating chicken soup or other clear broths, and maintaining good nutrition.  Getting plenty of rest.  Using gargles or lozenges for comfort.  Controlling fevers with ibuprofen or acetaminophen as directed by your caregiver.  Increasing usage of your inhaler if you have asthma. Zinc gel and zinc lozenges, taken in the first 24 hours of the common cold, can shorten the duration and lessen the severity of symptoms. Pain medicines may help with fever, muscle aches, and throat pain. A variety of non-prescription medicines are available to treat congestion and runny nose. Your caregiver   can make recommendations and may suggest nasal or lung inhalers for other symptoms.  HOME CARE INSTRUCTIONS   Only take over-the-counter or prescription medicines for pain, discomfort, or fever as directed by your  caregiver.  Use a warm mist humidifier or inhale steam from a shower to increase air moisture. This may keep secretions moist and make it easier to breathe.  Drink enough water and fluids to keep your urine clear or pale yellow.  Rest as needed.  Return to work when your temperature has returned to normal or as your caregiver advises. You may need to stay home longer to avoid infecting others. You can also use a face mask and careful hand washing to prevent spread of the virus. SEEK MEDICAL CARE IF:   After the first few days, you feel you are getting worse rather than better.  You need your caregiver's advice about medicines to control symptoms.  You develop chills, worsening shortness of breath, or brown or red sputum. These may be signs of pneumonia.  You develop yellow or brown nasal discharge or pain in the face, especially when you bend forward. These may be signs of sinusitis.  You develop a fever, swollen neck glands, pain with swallowing, or white areas in the back of your throat. These may be signs of strep throat. SEEK IMMEDIATE MEDICAL CARE IF:   You have a fever.  You develop severe or persistent headache, ear pain, sinus pain, or chest pain.  You develop wheezing, a prolonged cough, cough up blood, or have a change in your usual mucus (if you have chronic lung disease).  You develop sore muscles or a stiff neck. Document Released: 07/01/2000 Document Revised: 03/30/2011 Document Reviewed: 04/12/2013 Exit[employer] Patient Information 2015 [employer]. This information is not intended to replace advice given to you by your health care provider. Make sure you discuss any questions you have with your health care provider.  

## 2014-08-24 LAB — CULTURE, GROUP A STREP

## 2016-01-01 ENCOUNTER — Emergency Department (HOSPITAL_COMMUNITY): Payer: No Typology Code available for payment source

## 2016-01-01 ENCOUNTER — Encounter (HOSPITAL_COMMUNITY): Payer: Self-pay | Admitting: Adult Health

## 2016-01-01 ENCOUNTER — Emergency Department (HOSPITAL_COMMUNITY)
Admission: EM | Admit: 2016-01-01 | Discharge: 2016-01-02 | Disposition: A | Discharge status: Home / Self Care | Payer: No Typology Code available for payment source | Attending: Emergency Medicine | Admitting: Emergency Medicine

## 2016-01-01 DIAGNOSIS — J45909 Unspecified asthma, uncomplicated: Secondary | ICD-10-CM | POA: Diagnosis not present

## 2016-01-01 DIAGNOSIS — R935 Abnormal findings on diagnostic imaging of other abdominal regions, including retroperitoneum: Secondary | ICD-10-CM | POA: Diagnosis not present

## 2016-01-01 DIAGNOSIS — Y92410 Unspecified street and highway as the place of occurrence of the external cause: Secondary | ICD-10-CM | POA: Insufficient documentation

## 2016-01-01 DIAGNOSIS — F17210 Nicotine dependence, cigarettes, uncomplicated: Secondary | ICD-10-CM | POA: Diagnosis not present

## 2016-01-01 DIAGNOSIS — Y999 Unspecified external cause status: Secondary | ICD-10-CM | POA: Diagnosis not present

## 2016-01-01 DIAGNOSIS — M549 Dorsalgia, unspecified: Secondary | ICD-10-CM | POA: Insufficient documentation

## 2016-01-01 DIAGNOSIS — R0789 Other chest pain: Secondary | ICD-10-CM | POA: Diagnosis not present

## 2016-01-01 DIAGNOSIS — S0990XA Unspecified injury of head, initial encounter: Secondary | ICD-10-CM | POA: Insufficient documentation

## 2016-01-01 DIAGNOSIS — Z79899 Other long term (current) drug therapy: Secondary | ICD-10-CM | POA: Diagnosis not present

## 2016-01-01 DIAGNOSIS — Y939 Activity, unspecified: Secondary | ICD-10-CM | POA: Insufficient documentation

## 2016-01-01 DIAGNOSIS — M542 Cervicalgia: Secondary | ICD-10-CM | POA: Diagnosis not present

## 2016-01-01 LAB — I-STAT BETA HCG BLOOD, ED (MC, WL, AP ONLY)

## 2016-01-01 LAB — URINALYSIS, ROUTINE W REFLEX MICROSCOPIC
Bilirubin Urine: NEGATIVE
Glucose, UA: NEGATIVE mg/dL
Hgb urine dipstick: NEGATIVE
Ketones, ur: NEGATIVE mg/dL
LEUKOCYTES UA: NEGATIVE
Nitrite: NEGATIVE
PROTEIN: NEGATIVE mg/dL
Specific Gravity, Urine: 1.009 (ref 1.005–1.030)
pH: 5 (ref 5.0–8.0)

## 2016-01-01 LAB — COMPREHENSIVE METABOLIC PANEL
ALK PHOS: 54 U/L (ref 38–126)
ALT: 15 U/L (ref 14–54)
ANION GAP: 10 (ref 5–15)
AST: 24 U/L (ref 15–41)
Albumin: 3.9 g/dL (ref 3.5–5.0)
BUN: 12 mg/dL (ref 6–20)
CALCIUM: 9.4 mg/dL (ref 8.9–10.3)
CO2: 25 mmol/L (ref 22–32)
Chloride: 104 mmol/L (ref 101–111)
Creatinine, Ser: 0.83 mg/dL (ref 0.44–1.00)
GFR calc non Af Amer: >60 mL/min (ref >60)
Glucose, Bld: 78 mg/dL (ref 65–99)
POTASSIUM: 4 mmol/L (ref 3.5–5.1)
SODIUM: 139 mmol/L (ref 135–145)
TOTAL PROTEIN: 7.1 g/dL (ref 6.5–8.1)
Total Bilirubin: 0.2 mg/dL — ABNORMAL LOW (ref 0.3–1.2)

## 2016-01-01 LAB — PROTIME-INR
INR: 0.96
Prothrombin Time: 12.8 seconds (ref 11.4–15.2)

## 2016-01-01 LAB — I-STAT CG4 LACTIC ACID, ED: LACTIC ACID, VENOUS: 1.4 mmol/L (ref 0.5–1.9)

## 2016-01-01 LAB — I-STAT CHEM 8, ED
BUN: 15 mg/dL (ref 6–20)
CALCIUM ION: 1.15 mmol/L (ref 1.15–1.40)
CHLORIDE: 103 mmol/L (ref 101–111)
Creatinine, Ser: 0.8 mg/dL (ref 0.44–1.00)
GLUCOSE: 78 mg/dL (ref 65–99)
HCT: 39 % (ref 36.0–46.0)
Hemoglobin: 13.3 g/dL (ref 12.0–15.0)
Potassium: 4.6 mmol/L (ref 3.5–5.1)
Sodium: 138 mmol/L (ref 135–145)
TCO2: 29 mmol/L (ref 0–100)

## 2016-01-01 LAB — CBC
HEMATOCRIT: 39 % (ref 36.0–46.0)
HEMOGLOBIN: 13 g/dL (ref 12.0–15.0)
MCH: 29.3 pg (ref 26.0–34.0)
MCHC: 33.3 g/dL (ref 30.0–36.0)
MCV: 87.8 fL (ref 78.0–100.0)
Platelets: 305 10*3/uL (ref 150–400)
RBC: 4.44 MIL/uL (ref 3.87–5.11)
RDW: 12.9 % (ref 11.5–15.5)
WBC: 7.8 10*3/uL (ref 4.0–10.5)

## 2016-01-01 LAB — ETHANOL: Alcohol, Ethyl (B): <5 mg/dL (ref <5)

## 2016-01-01 MED ORDER — NAPROXEN 500 MG PO TABS
500.0000 mg | ORAL_TABLET | Freq: Two times a day (BID) | ORAL | 0 refills | Status: DC
Start: 2016-01-01 — End: 2017-07-09

## 2016-01-01 MED ORDER — FENTANYL CITRATE (PF) 100 MCG/2ML IJ SOLN
100.0000 ug | Freq: Once | INTRAMUSCULAR | Status: AC
  Administered 2016-01-01: 100 ug via INTRAVENOUS
  Filled 2016-01-01: qty 2

## 2016-01-01 MED ORDER — IOPAMIDOL (ISOVUE-300) INJECTION 61%
INTRAVENOUS | Status: AC
  Administered 2016-01-01: 100 mL
  Filled 2016-01-01: qty 100

## 2016-01-01 NOTE — ED Notes (Signed)
Pt log rolled using 3 staff members maintaining C spine precautions.

## 2016-01-01 NOTE — ED Notes (Signed)
Pt has very childlike affect, laughing one moment saying "momma momma momma" another moment speaking clearly and having normal conversation. Pt medicated per MD order.

## 2016-01-01 NOTE — ED Notes (Signed)
Pt in CT at this time.
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
## 2016-01-01 NOTE — ED Notes (Signed)
Pt disconnected herself and ambulated to BR with steady gait. Son at bedside
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
## 2016-01-06 LAB — CDS SEROLOGY

## 2016-01-11 ENCOUNTER — Emergency Department (HOSPITAL_COMMUNITY): Payer: No Typology Code available for payment source

## 2016-01-11 ENCOUNTER — Emergency Department (HOSPITAL_COMMUNITY)
Admission: EM | Admit: 2016-01-11 | Discharge: 2016-01-11 | Disposition: A | Discharge status: Home / Self Care | Payer: No Typology Code available for payment source | Attending: Emergency Medicine | Admitting: Emergency Medicine

## 2016-01-11 ENCOUNTER — Encounter (HOSPITAL_COMMUNITY): Payer: Self-pay

## 2016-01-11 DIAGNOSIS — Y939 Activity, unspecified: Secondary | ICD-10-CM | POA: Diagnosis not present

## 2016-01-11 DIAGNOSIS — S20219A Contusion of unspecified front wall of thorax, initial encounter: Secondary | ICD-10-CM | POA: Insufficient documentation

## 2016-01-11 DIAGNOSIS — Y999 Unspecified external cause status: Secondary | ICD-10-CM | POA: Diagnosis not present

## 2016-01-11 DIAGNOSIS — R0789 Other chest pain: Secondary | ICD-10-CM

## 2016-01-11 DIAGNOSIS — J45909 Unspecified asthma, uncomplicated: Secondary | ICD-10-CM | POA: Diagnosis not present

## 2016-01-11 DIAGNOSIS — S299XXA Unspecified injury of thorax, initial encounter: Secondary | ICD-10-CM | POA: Diagnosis present

## 2016-01-11 DIAGNOSIS — Z79899 Other long term (current) drug therapy: Secondary | ICD-10-CM | POA: Diagnosis not present

## 2016-01-11 DIAGNOSIS — Y92410 Unspecified street and highway as the place of occurrence of the external cause: Secondary | ICD-10-CM | POA: Insufficient documentation

## 2016-01-11 DIAGNOSIS — F17210 Nicotine dependence, cigarettes, uncomplicated: Secondary | ICD-10-CM | POA: Insufficient documentation

## 2016-01-11 LAB — CBC WITH DIFFERENTIAL/PLATELET
BASOS ABS: 0.1 10*3/uL (ref 0.0–0.1)
Basophils Relative: 1 %
Eosinophils Absolute: 0.3 10*3/uL (ref 0.0–0.7)
Eosinophils Relative: 4 %
HEMATOCRIT: 40.2 % (ref 36.0–46.0)
HEMOGLOBIN: 13.9 g/dL (ref 12.0–15.0)
Lymphocytes Relative: 41 %
Lymphs Abs: 2.3 10*3/uL (ref 0.7–4.0)
MCH: 29.5 pg (ref 26.0–34.0)
MCHC: 34.6 g/dL (ref 30.0–36.0)
MCV: 85.4 fL (ref 78.0–100.0)
Monocytes Absolute: 0.5 10*3/uL (ref 0.1–1.0)
Monocytes Relative: 8 %
NEUTROS ABS: 2.6 10*3/uL (ref 1.7–7.7)
NEUTROS PCT: 46 %
Platelets: 284 10*3/uL (ref 150–400)
RBC: 4.71 MIL/uL (ref 3.87–5.11)
RDW: 12.8 % (ref 11.5–15.5)
WBC: 5.7 10*3/uL (ref 4.0–10.5)

## 2016-01-11 LAB — URINALYSIS, ROUTINE W REFLEX MICROSCOPIC
Bacteria, UA: NONE SEEN
Bilirubin Urine: NEGATIVE
GLUCOSE, UA: NEGATIVE mg/dL
KETONES UR: NEGATIVE mg/dL
LEUKOCYTES UA: NEGATIVE
Nitrite: NEGATIVE
PH: 6 (ref 5.0–8.0)
Protein, ur: NEGATIVE mg/dL
RBC / HPF: NONE SEEN RBC/hpf (ref 0–5)
Specific Gravity, Urine: 1.004 — ABNORMAL LOW (ref 1.005–1.030)

## 2016-01-11 LAB — COMPREHENSIVE METABOLIC PANEL
ALBUMIN: 4.3 g/dL (ref 3.5–5.0)
ALK PHOS: 57 U/L (ref 38–126)
ALT: 19 U/L (ref 14–54)
AST: 29 U/L (ref 15–41)
Anion gap: 7 (ref 5–15)
BILIRUBIN TOTAL: 1 mg/dL (ref 0.3–1.2)
BUN: 13 mg/dL (ref 6–20)
CALCIUM: 9.4 mg/dL (ref 8.9–10.3)
CO2: 29 mmol/L (ref 22–32)
Chloride: 101 mmol/L (ref 101–111)
Creatinine, Ser: 0.85 mg/dL (ref 0.44–1.00)
GFR calc Af Amer: >60 mL/min (ref >60)
GFR calc non Af Amer: >60 mL/min (ref >60)
GLUCOSE: 87 mg/dL (ref 65–99)
Potassium: 5.1 mmol/L (ref 3.5–5.1)
Sodium: 137 mmol/L (ref 135–145)
TOTAL PROTEIN: 7.6 g/dL (ref 6.5–8.1)

## 2016-01-11 LAB — I-STAT TROPONIN, ED: Troponin i, poc: 0 ng/mL (ref 0.00–0.08)

## 2016-01-11 LAB — I-STAT BETA HCG BLOOD, ED (MC, WL, AP ONLY): I-stat hCG, quantitative: <5 m[IU]/mL (ref <5)

## 2016-01-11 MED ORDER — DIAZEPAM 5 MG/ML IJ SOLN
5.0000 mg | Freq: Once | INTRAMUSCULAR | Status: DC
  Filled 2016-01-11: qty 2

## 2016-01-11 MED ORDER — TRAMADOL HCL 50 MG PO TABS: 50.0000 mg | ORAL_TABLET | Freq: Four times a day (QID) | ORAL | 0 refills | Status: DC | PRN

## 2016-01-11 MED ORDER — MORPHINE SULFATE (PF) 4 MG/ML IV SOLN
4.0000 mg | Freq: Once | INTRAVENOUS | Status: DC
  Filled 2016-01-11: qty 1

## 2016-01-11 MED ORDER — KETOROLAC TROMETHAMINE 30 MG/ML IJ SOLN
30.0000 mg | Freq: Once | INTRAMUSCULAR | Status: DC
  Filled 2016-01-11: qty 1

## 2016-01-11 MED ORDER — MORPHINE SULFATE (PF) 4 MG/ML IV SOLN
4.0000 mg | Freq: Once | INTRAVENOUS | Status: AC
  Administered 2016-01-11: 4 mg via INTRAMUSCULAR

## 2016-01-11 MED ORDER — DIAZEPAM 5 MG/ML IJ SOLN
5.0000 mg | Freq: Once | INTRAMUSCULAR | Status: AC
  Administered 2016-01-11: 5 mg via INTRAMUSCULAR

## 2016-01-11 MED ORDER — CYCLOBENZAPRINE HCL 5 MG PO TABS: 5.0000 mg | ORAL_TABLET | Freq: Three times a day (TID) | ORAL | 0 refills | Status: DC | PRN

## 2016-01-11 MED ORDER — IBUPROFEN 800 MG PO TABS
800.0000 mg | ORAL_TABLET | Freq: Once | ORAL | Status: AC
  Administered 2016-01-11: 800 mg via ORAL
  Filled 2016-01-11: qty 1

## 2016-01-11 NOTE — ED Notes (Signed)
Nurse is in the room collecting labs 
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
## 2016-01-11 NOTE — Discharge Instructions (Signed)
Continue taking motrin or naprosyn.  Take flexeril for muscle spasms  Take tramadol for pain. Do NOT drive with it   No heavy lifting for 2 weeks   See your doctor  Return to ER if you have worse chest pain, shortness of breath, back pain.

## 2016-07-30 ENCOUNTER — Emergency Department (HOSPITAL_BASED_OUTPATIENT_CLINIC_OR_DEPARTMENT_OTHER): Payer: Self-pay

## 2016-07-30 ENCOUNTER — Emergency Department (HOSPITAL_BASED_OUTPATIENT_CLINIC_OR_DEPARTMENT_OTHER)
Admission: EM | Admit: 2016-07-30 | Discharge: 2016-07-30 | Disposition: A | Discharge status: Home / Self Care | Payer: Self-pay | Attending: Emergency Medicine | Admitting: Emergency Medicine

## 2016-07-30 ENCOUNTER — Encounter (HOSPITAL_BASED_OUTPATIENT_CLINIC_OR_DEPARTMENT_OTHER): Payer: Self-pay | Admitting: *Deleted

## 2016-07-30 DIAGNOSIS — Z79899 Other long term (current) drug therapy: Secondary | ICD-10-CM | POA: Insufficient documentation

## 2016-07-30 DIAGNOSIS — R0789 Other chest pain: Secondary | ICD-10-CM | POA: Insufficient documentation

## 2016-07-30 DIAGNOSIS — F17210 Nicotine dependence, cigarettes, uncomplicated: Secondary | ICD-10-CM | POA: Insufficient documentation

## 2016-07-30 DIAGNOSIS — J45909 Unspecified asthma, uncomplicated: Secondary | ICD-10-CM | POA: Insufficient documentation

## 2016-07-30 DIAGNOSIS — R600 Localized edema: Secondary | ICD-10-CM

## 2016-07-30 DIAGNOSIS — R2243 Localized swelling, mass and lump, lower limb, bilateral: Secondary | ICD-10-CM | POA: Insufficient documentation

## 2016-07-30 HISTORY — DX: Opioid abuse, uncomplicated: F11.10

## 2016-07-30 LAB — CBC WITH DIFFERENTIAL/PLATELET
BASOS ABS: 0 10*3/uL (ref 0.0–0.1)
BASOS PCT: 1 %
EOS ABS: 0.3 10*3/uL (ref 0.0–0.7)
EOS PCT: 5 %
HCT: 38.9 % (ref 36.0–46.0)
Hemoglobin: 13.1 g/dL (ref 12.0–15.0)
Lymphocytes Relative: 31 %
Lymphs Abs: 1.9 10*3/uL (ref 0.7–4.0)
MCH: 30 pg (ref 26.0–34.0)
MCHC: 33.7 g/dL (ref 30.0–36.0)
MCV: 89 fL (ref 78.0–100.0)
MONO ABS: 0.7 10*3/uL (ref 0.1–1.0)
MONOS PCT: 11 %
Neutro Abs: 3.3 10*3/uL (ref 1.7–7.7)
Neutrophils Relative %: 52 %
PLATELETS: 291 10*3/uL (ref 150–400)
RBC: 4.37 MIL/uL (ref 3.87–5.11)
RDW: 13.3 % (ref 11.5–15.5)
WBC: 6.2 10*3/uL (ref 4.0–10.5)

## 2016-07-30 LAB — COMPREHENSIVE METABOLIC PANEL
ALBUMIN: 3.7 g/dL (ref 3.5–5.0)
ALK PHOS: 66 U/L (ref 38–126)
ALT: 17 U/L (ref 14–54)
ANION GAP: 9 (ref 5–15)
AST: 22 U/L (ref 15–41)
BUN: 8 mg/dL (ref 6–20)
CALCIUM: 9 mg/dL (ref 8.9–10.3)
CHLORIDE: 103 mmol/L (ref 101–111)
CO2: 27 mmol/L (ref 22–32)
Creatinine, Ser: 0.83 mg/dL (ref 0.44–1.00)
GFR calc non Af Amer: >60 mL/min (ref >60)
Glucose, Bld: 79 mg/dL (ref 65–99)
POTASSIUM: 4.3 mmol/L (ref 3.5–5.1)
SODIUM: 139 mmol/L (ref 135–145)
Total Bilirubin: 0.3 mg/dL (ref 0.3–1.2)
Total Protein: 7.2 g/dL (ref 6.5–8.1)

## 2016-07-30 LAB — TROPONIN I

## 2016-07-30 NOTE — Discharge Instructions (Signed)
Please read instructions below.  Return to the ER for new or worsening symptoms; including worsening chest pain, shortness of breath, pain that radiates to the arm or neck, pain or shortness of breath worsened with exertion.  Follow up with your primary care provider regarding your leg swelling. You can take Tylenol or Advil as needed for pain.

## 2016-07-30 NOTE — ED Notes (Signed)
Patient gone to US at this time will obtain vitals when patient returns

## 2016-07-30 NOTE — ED Provider Notes (Signed)
[facility]   CSN: [id number] Arrival date & time: 07/30/16  1559   By signing my name below, I, Christy Barnett, attest that this documentation has been prepared under the direction and in the presence of Christy Russo, PA-C. Electronically signed, Christy Barnett, ED Scribe. 07/30/16. 4:37 PM.  History   Chief Complaint Chief Complaint  Patient presents with  . Chest Pain   The history is provided by the patient and medical records. No language interpreter was used.    Christy Barnett is a 46 y.o. female presenting to the Emergency Department concerning worsening L chest pain x 3 days that worsened today. She states she has had bilateral leg and foot swelling and pain intermittently x 1 year, and she notes associated L shoulder pain, and one episode of nausea and diaphoresis. Pt states chest pain episodes typically comes on at rest, lasting ~8-10 minutes. She described 5/10 L chest and shoulder pains, she states moving her left arm and expiration worsens her pain and laying still improves her pain. She also describes moderate leg pain that is worse with walking and mildly improved with elevation. She states both lower extremities hurt equally. [facility]; she states she has not been evaluated for the lower extremity pain and swelling by her PCP. She notes occasional heavy lifting at work. She states she smokes half a pack of cigarettes daily. Hx of prior IV drug use and Hepatitis C Hx reported. Pt states she no longer uses IV drugs. No hormone therapy. No h/o blood clots. No abdominal pain, constipation, diarrhea, fever or cough.   Past Medical History:  Diagnosis Date  . Asthma   . Bronchitis   . Heroin abuse     There are no active problems to display for this patient.   Past Surgical History:  Procedure Laterality Date  . CESAREAN SECTION      OB History    No data available       Home Medications     Prior to Admission medications   Medication Sig Start Date End Date Taking? Authorizing Provider  cyclobenzaprine (FLEXERIL) 5 MG tablet Take 1 tablet (5 mg total) by mouth 3 (three) times daily as needed for muscle spasms. 01/11/16   Charlynne Pander, MD  naproxen (NAPROSYN) 500 MG tablet Take 1 tablet (500 mg total) by mouth 2 (two) times daily. 01/01/16   Derwood Kaplan, MD  pseudoephedrine (SUDAFED 12 HOUR) 120 MG 12 hr tablet Take 1 tablet (120 mg total) by mouth 2 (two) times daily. Patient not taking: Reported on 01/11/2016 08/20/14   Earley Favor, NP  traMADol (ULTRAM) 50 MG tablet Take 1 tablet (50 mg total) by mouth every 6 (six) hours as needed. 01/11/16   Charlynne Pander, MD    Family History No family history on file.  Social History Social History  Substance Use Topics  . Smoking status: Current Every Day Smoker    Packs/day: 1.00    Types: Cigarettes  . Smokeless tobacco: Never Used  . Alcohol use No     Allergies   Patient has no known allergies.   Review of Systems Review of Systems  Constitutional: Positive for appetite change and diaphoresis. Negative for fever.  Respiratory: Positive for shortness of breath. Negative for cough.   Cardiovascular: Positive for chest pain and leg swelling.  Gastrointestinal: Positive for nausea. Negative for abdominal pain, constipation and diarrhea.  Musculoskeletal: Positive for myalgias. Negative for  back pain.  All other systems reviewed and are negative.    Physical Exam Updated Vital Signs BP 123/65 (BP Location: Right Arm)   Pulse 88   Temp 98.4 F (36.9 C) (Oral)   Resp 20   LMP 07/02/2016   SpO2 99%   Physical Exam  Constitutional: She appears well-developed and well-nourished. No distress.  HENT:  Head: Normocephalic and atraumatic.  Eyes: Conjunctivae are normal.  Neck: Normal range of motion. Neck supple.  Cardiovascular: Normal rate, regular rhythm, normal heart sounds and intact distal  pulses.  Exam reveals no gallop and no friction rub.   No murmur heard. Pulmonary/Chest: Effort normal. No respiratory distress. She has no wheezes. She has no rales.   No wheezes or rales. Chest pain reproducible on palpation with tenderness over sternum, L pectoral and L shoulder. Pain in shoulder is also reproduced with ROM.  Abdominal: Soft. Bowel sounds are normal. She exhibits no distension. There is no tenderness. There is no rebound and no guarding.  Musculoskeletal: Normal range of motion.  Neurological: She is alert.  Skin: Skin is warm.  Bilateral lower extremity tender pitting edema. Some small areas of erythema to bilateral lower legs.  Psychiatric: She has a normal mood and affect. Her behavior is normal.  Nursing note and vitals reviewed.    ED Treatments / Results  DIAGNOSTIC STUDIES: Oxygen Saturation is 99% on RA, NL by my interpretation.    COORDINATION OF CARE: 4:35 PM-Discussed next steps with pt. Pt verbalized understanding and is agreeable with the plan. Will order blood work.   Labs (all labs ordered are listed, but only abnormal results are displayed) Labs Reviewed  CBC WITH DIFFERENTIAL/PLATELET  TROPONIN I  COMPREHENSIVE METABOLIC PANEL    EKG  EKG Interpretation None       Radiology Dg Chest 2 View  Result Date: 07/30/2016 CLINICAL DATA:  Chest pain for 1 day, history of asthma and bronchitis, smoker EXAM: CHEST  2 VIEW COMPARISON:  01/11/2016 FINDINGS: Normal heart size, mediastinal contours, and pulmonary vascularity. Lungs clear. No pleural effusion pneumothorax. Bones unremarkable. IMPRESSION: Normal exam. Electronically Signed   By: Ulyses SouthwardMark  Boles M.D.   On: 07/30/2016 17:28   [place] Venous Img Lower Bilateral  Result Date: 07/30/2016 CLINICAL DATA:  Acute on chronic bilateral lower extremity leg swelling for 1 day. History of heroin abuse. EXAM: BILATERAL LOWER EXTREMITY VENOUS DOPPLER ULTRASOUND TECHNIQUE: Gray-scale sonography with graded  compression, as well as color Doppler and duplex ultrasound were performed to evaluate the lower extremity deep venous systems from the level of the common femoral vein and including the common femoral, femoral, profunda femoral, popliteal and calf veins including the posterior tibial, peroneal and gastrocnemius veins when visible. The superficial great saphenous vein was also interrogated. Spectral Doppler was utilized to evaluate flow at rest and with distal augmentation maneuvers in the common femoral, femoral and popliteal veins. COMPARISON:  None. FINDINGS: RIGHT LOWER EXTREMITY Common Femoral Vein: No evidence of thrombus. Normal compressibility, respiratory phasicity and response to augmentation. Saphenofemoral Junction: No evidence of thrombus. Normal compressibility and flow on color Doppler imaging. Profunda Femoral Vein: No evidence of thrombus. Normal compressibility and flow on color Doppler imaging. Duplicated Femoral Vein: No evidence of thrombus. Normal compressibility, respiratory phasicity and response to augmentation. Popliteal Vein: No evidence of thrombus. Normal compressibility, respiratory phasicity and response to augmentation. Calf Veins: No evidence of thrombus. Normal compressibility and flow on color Doppler imaging. Superficial Great Saphenous Vein: No evidence of thrombus. Normal compressibility  and flow on color Doppler imaging. Other Findings:  Interstitial edema in the calf and ankle. LEFT LOWER EXTREMITY Common Femoral Vein: No evidence of thrombus. Normal compressibility, respiratory phasicity and response to augmentation. Saphenofemoral Junction: No evidence of thrombus. Normal compressibility and flow on color Doppler imaging. Profunda Femoral Vein: No evidence of thrombus. Normal compressibility and flow on color Doppler imaging. Duplicated Femoral Vein: No evidence of thrombus. Normal compressibility, respiratory phasicity and response to augmentation. Popliteal Vein: No  evidence of thrombus. Normal compressibility, respiratory phasicity and response to augmentation. Calf Veins: No evidence of thrombus. Normal compressibility and flow on color Doppler imaging. Superficial Great Saphenous Vein: No evidence of thrombus. Normal compressibility and flow on color Doppler imaging. Other Findings:  Interstitial edema in the calf and ankle. IMPRESSION: No acute bilateral lower extremity deep vein thrombosis. Mild edematous calf and ankle soft tissues. Electronically Signed   By: Awilda Metro M.D.   On: 07/30/2016 19:46    Procedures Procedures (including critical care time)  Medications Ordered in ED Medications - No data to display   Initial Impression / Assessment and Plan / ED Course  I have reviewed the triage vital signs and the nursing notes.  Pertinent labs & imaging results that were available during my care of the patient were reviewed by me and considered in my medical decision making (see chart for details).     Patient is to be discharged with recommendation to follow up with PCP in regards to today's hospital visit. Chest pain is not likely of cardiac or pulmonary etiology d/t presentation, PERC negative, VSS, no tracheal deviation, no JVD or new murmur, RRR, breath sounds equal bilaterally, EKG without acute abnormalities, negative troponin, and negative CXR. Chest pain is reproducible on exam. Venous ultrasound of bilateral lower extremities done, showing no evidence of DVT or superficial thrombosis. Pt has been advised to return to the ED if CP becomes exertional, radiates to left jaw/arm, worsens or becomes concerning in any way. Recommended patient to follow up with PCP for her lower extremity edema. Pt appears reliable for follow up and is agreeable to discharge.   Case has been discussed with Dr. Verdie Mosher who agrees with the above plan to discharge.   Discussed results, findings, treatment and follow up. Patient advised of return precautions. Patient  verbalized understanding and agreed with plan.  Final Clinical Impressions(s) / ED Diagnoses   Final diagnoses:  Chest wall pain  Bilateral lower extremity edema    New Prescriptions Discharge Medication List as of 07/30/2016  8:18 PM    I personally performed the services described in this documentation, which was scribed in my presence. The recorded information has been reviewed and is accurate.    Barnett, Christy N, PA-C 07/31/16 0147    Lavera Guise, MD 07/31/16 1540

## 2016-07-30 NOTE — ED Notes (Signed)
Pt returned from US   Awaiting results

## 2016-07-30 NOTE — ED Triage Notes (Signed)
Pt reports swelling in both legs off and on x 1 year. Reports chest pain x 3 days. Pt states pain in also in her left shoulder and she had nausea got sweaty with the pain

## 2016-11-06 ENCOUNTER — Emergency Department (HOSPITAL_COMMUNITY): Payer: Self-pay

## 2016-11-06 ENCOUNTER — Emergency Department (HOSPITAL_COMMUNITY)
Admission: EM | Admit: 2016-11-06 | Discharge: 2016-11-06 | Disposition: A | Discharge status: Home / Self Care | Payer: Self-pay | Attending: Emergency Medicine | Admitting: Emergency Medicine

## 2016-11-06 ENCOUNTER — Emergency Department (HOSPITAL_BASED_OUTPATIENT_CLINIC_OR_DEPARTMENT_OTHER): Payer: Self-pay

## 2016-11-06 ENCOUNTER — Encounter (HOSPITAL_COMMUNITY): Payer: Self-pay

## 2016-11-06 DIAGNOSIS — F17210 Nicotine dependence, cigarettes, uncomplicated: Secondary | ICD-10-CM | POA: Insufficient documentation

## 2016-11-06 DIAGNOSIS — M7989 Other specified soft tissue disorders: Secondary | ICD-10-CM | POA: Insufficient documentation

## 2016-11-06 DIAGNOSIS — M79609 Pain in unspecified limb: Secondary | ICD-10-CM

## 2016-11-06 DIAGNOSIS — J45909 Unspecified asthma, uncomplicated: Secondary | ICD-10-CM | POA: Insufficient documentation

## 2016-11-06 LAB — CBC WITH DIFFERENTIAL/PLATELET
Basophils Absolute: 0.1 10*3/uL (ref 0.0–0.1)
Basophils Relative: 1 %
EOS PCT: 3 %
Eosinophils Absolute: 0.2 10*3/uL (ref 0.0–0.7)
HCT: 38.7 % (ref 36.0–46.0)
HEMOGLOBIN: 12.8 g/dL (ref 12.0–15.0)
LYMPHS ABS: 2.5 10*3/uL (ref 0.7–4.0)
LYMPHS PCT: 31 %
MCH: 29.3 pg (ref 26.0–34.0)
MCHC: 33.1 g/dL (ref 30.0–36.0)
MCV: 88.6 fL (ref 78.0–100.0)
MONOS PCT: 9 %
Monocytes Absolute: 0.7 10*3/uL (ref 0.1–1.0)
NEUTROS ABS: 4.6 10*3/uL (ref 1.7–7.7)
NEUTROS PCT: 56 %
PLATELETS: 340 10*3/uL (ref 150–400)
RBC: 4.37 MIL/uL (ref 3.87–5.11)
RDW: 13.5 % (ref 11.5–15.5)
WBC: 8.1 10*3/uL (ref 4.0–10.5)

## 2016-11-06 LAB — URINALYSIS, ROUTINE W REFLEX MICROSCOPIC
BILIRUBIN URINE: NEGATIVE
GLUCOSE, UA: NEGATIVE mg/dL
HGB URINE DIPSTICK: NEGATIVE
Ketones, ur: NEGATIVE mg/dL
Leukocytes, UA: NEGATIVE
Nitrite: NEGATIVE
Protein, ur: NEGATIVE mg/dL
SPECIFIC GRAVITY, URINE: 1.017 (ref 1.005–1.030)
pH: 5 (ref 5.0–8.0)

## 2016-11-06 LAB — COMPREHENSIVE METABOLIC PANEL
ALBUMIN: 3.9 g/dL (ref 3.5–5.0)
ALT: 20 U/L (ref 14–54)
ANION GAP: 11 (ref 5–15)
AST: 28 U/L (ref 15–41)
Alkaline Phosphatase: 70 U/L (ref 38–126)
BUN: 9 mg/dL (ref 6–20)
CHLORIDE: 99 mmol/L — AB (ref 101–111)
CO2: 27 mmol/L (ref 22–32)
Calcium: 9.4 mg/dL (ref 8.9–10.3)
Creatinine, Ser: 0.89 mg/dL (ref 0.44–1.00)
GFR calc non Af Amer: >60 mL/min (ref >60)
GLUCOSE: 96 mg/dL (ref 65–99)
POTASSIUM: 4.1 mmol/L (ref 3.5–5.1)
SODIUM: 137 mmol/L (ref 135–145)
Total Bilirubin: 0.5 mg/dL (ref 0.3–1.2)
Total Protein: 7.6 g/dL (ref 6.5–8.1)

## 2016-11-06 LAB — I-STAT CG4 LACTIC ACID, ED: LACTIC ACID, VENOUS: 1.64 mmol/L (ref 0.5–1.9)

## 2016-11-06 MED ORDER — IBUPROFEN 200 MG PO TABS
600.0000 mg | ORAL_TABLET | Freq: Once | ORAL | Status: AC
  Administered 2016-11-06: 600 mg via ORAL
  Filled 2016-11-06: qty 1

## 2016-11-06 NOTE — Progress Notes (Signed)
*  PRELIMINARY RESULTS* Vascular Ultrasound Left lower extremity venous duplex has been completed.  Preliminary findings: No evidence of deep vein thrombosis or baker's cyst in the left lower extremity.   Christy FischerCharlotte C Raymie Barnett 11/06/2016, 2:58 PM
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
## 2016-11-06 NOTE — Discharge Instructions (Addendum)
It was our pleasure to provide your ER care today - we hope that you feel better.  Elevate legs and limit salt intake to help with the swelling.  Avoid injecting any drugs into your body.  Take acetaminophen and/or ibuprofen as need for pain.  Follow up with primary care doctor in the coming week.  Return to ER right away if worse, new symptoms, fevers, spreading redness, new/severe pain, other concern.

## 2016-11-06 NOTE — ED Notes (Signed)
Pt ambulated to restroom to give urine sample.

## 2016-11-12 ENCOUNTER — Encounter (HOSPITAL_COMMUNITY): Payer: Self-pay | Admitting: Emergency Medicine

## 2016-11-12 ENCOUNTER — Emergency Department (HOSPITAL_COMMUNITY)
Admission: EM | Admit: 2016-11-12 | Discharge: 2016-11-13 | Disposition: A | Discharge status: Home / Self Care | Payer: Self-pay | Attending: Emergency Medicine | Admitting: Emergency Medicine

## 2016-11-12 ENCOUNTER — Emergency Department (HOSPITAL_COMMUNITY): Payer: Self-pay

## 2016-11-12 DIAGNOSIS — Y92410 Unspecified street and highway as the place of occurrence of the external cause: Secondary | ICD-10-CM | POA: Insufficient documentation

## 2016-11-12 DIAGNOSIS — R4182 Altered mental status, unspecified: Secondary | ICD-10-CM | POA: Insufficient documentation

## 2016-11-12 DIAGNOSIS — R109 Unspecified abdominal pain: Secondary | ICD-10-CM | POA: Insufficient documentation

## 2016-11-12 DIAGNOSIS — Y9389 Activity, other specified: Secondary | ICD-10-CM | POA: Insufficient documentation

## 2016-11-12 DIAGNOSIS — Y999 Unspecified external cause status: Secondary | ICD-10-CM | POA: Insufficient documentation

## 2016-11-12 DIAGNOSIS — S2242XA Multiple fractures of ribs, left side, initial encounter for closed fracture: Secondary | ICD-10-CM | POA: Insufficient documentation

## 2016-11-12 LAB — CBC WITH DIFFERENTIAL/PLATELET
BASOS ABS: 0 10*3/uL (ref 0.0–0.1)
Basophils Relative: 1 %
EOS ABS: 0.3 10*3/uL (ref 0.0–0.7)
EOS PCT: 3 %
HCT: 40.5 % (ref 36.0–46.0)
Hemoglobin: 13.4 g/dL (ref 12.0–15.0)
LYMPHS ABS: 2.8 10*3/uL (ref 0.7–4.0)
LYMPHS PCT: 36 %
MCH: 29.5 pg (ref 26.0–34.0)
MCHC: 33.1 g/dL (ref 30.0–36.0)
MCV: 89.2 fL (ref 78.0–100.0)
MONO ABS: 0.3 10*3/uL (ref 0.1–1.0)
Monocytes Relative: 4 %
Neutro Abs: 4.4 10*3/uL (ref 1.7–7.7)
Neutrophils Relative %: 56 %
PLATELETS: 301 10*3/uL (ref 150–400)
RBC: 4.54 MIL/uL (ref 3.87–5.11)
RDW: 13.7 % (ref 11.5–15.5)
WBC: 7.9 10*3/uL (ref 4.0–10.5)

## 2016-11-12 LAB — PROTIME-INR
INR: 1.01
PROTHROMBIN TIME: 13.2 s (ref 11.4–15.2)

## 2016-11-12 LAB — COMPREHENSIVE METABOLIC PANEL
ALT: 24 U/L (ref 14–54)
ANION GAP: 11 (ref 5–15)
AST: 40 U/L (ref 15–41)
Albumin: 3.9 g/dL (ref 3.5–5.0)
Alkaline Phosphatase: 72 U/L (ref 38–126)
BUN: 8 mg/dL (ref 6–20)
CO2: 28 mmol/L (ref 22–32)
CREATININE: 0.86 mg/dL (ref 0.44–1.00)
Calcium: 9.2 mg/dL (ref 8.9–10.3)
Chloride: 99 mmol/L — ABNORMAL LOW (ref 101–111)
Glucose, Bld: 90 mg/dL (ref 65–99)
POTASSIUM: 3.4 mmol/L — AB (ref 3.5–5.1)
SODIUM: 138 mmol/L (ref 135–145)
Total Bilirubin: 0.2 mg/dL — ABNORMAL LOW (ref 0.3–1.2)
Total Protein: 7.1 g/dL (ref 6.5–8.1)

## 2016-11-12 LAB — ETHANOL

## 2016-11-12 LAB — APTT: APTT: 25 s (ref 24–36)

## 2016-11-12 LAB — I-STAT BETA HCG BLOOD, ED (MC, WL, AP ONLY)

## 2016-11-12 MED ORDER — IOPAMIDOL (ISOVUE-300) INJECTION 61%
INTRAVENOUS | Status: AC
  Filled 2016-11-12: qty 100

## 2016-11-12 MED ORDER — SODIUM CHLORIDE 0.9 % IV SOLN
INTRAVENOUS | Status: AC | PRN
  Administered 2016-11-12: 50 mL/h via INTRAVENOUS

## 2016-11-12 MED ORDER — IOPAMIDOL (ISOVUE-300) INJECTION 61%
100.0000 mL | Freq: Once | INTRAVENOUS | Status: AC | PRN
  Administered 2016-11-12: 100 mL via INTRAVENOUS
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
## 2016-11-12 NOTE — ED Notes (Signed)
EDP notified that pt. needs large bore IV for CT scan .

## 2016-11-12 NOTE — ED Notes (Signed)
Dr. Hyacinth MeekerMiller inserted 20g. angiocath IV at left ac using portable ultrasound.
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
## 2016-11-13 ENCOUNTER — Emergency Department (HOSPITAL_COMMUNITY): Payer: Self-pay

## 2016-11-13 ENCOUNTER — Encounter (HOSPITAL_COMMUNITY): Payer: Self-pay

## 2016-11-13 LAB — CBG MONITORING, ED: GLUCOSE-CAPILLARY: 104 mg/dL — AB (ref 65–99)

## 2016-11-13 MED ORDER — KETOROLAC TROMETHAMINE 30 MG/ML IJ SOLN: 30.0000 mg | Freq: Once | INTRAMUSCULAR | Status: DC

## 2016-11-13 MED ORDER — KETOROLAC TROMETHAMINE 30 MG/ML IJ SOLN
30.0000 mg | Freq: Once | INTRAMUSCULAR | Status: AC
  Administered 2016-11-13: 30 mg via INTRAMUSCULAR
  Filled 2016-11-13: qty 1

## 2016-11-13 NOTE — ED Notes (Signed)
Patient dressed in hospital scrubs and placed in wheelchair. Transported to lobby to await transportation.

## 2016-11-13 NOTE — ED Notes (Signed)
Patient up and ambulatory under own power at this point. Currently complaining that her back still hurts and that she can't find her phone.
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
## 2016-11-13 NOTE — ED Notes (Signed)
Brother-in-law contacted by registration. Family member arranging transportation at this point.

## 2016-11-23 ENCOUNTER — Emergency Department (HOSPITAL_COMMUNITY): Payer: Self-pay

## 2016-11-23 ENCOUNTER — Emergency Department (HOSPITAL_COMMUNITY)
Admission: EM | Admit: 2016-11-23 | Discharge: 2016-11-23 | Disposition: A | Discharge status: Home / Self Care | Payer: Self-pay | Attending: Emergency Medicine | Admitting: Emergency Medicine

## 2016-11-23 ENCOUNTER — Encounter (HOSPITAL_COMMUNITY): Payer: Self-pay

## 2016-11-23 DIAGNOSIS — S2232XA Fracture of one rib, left side, initial encounter for closed fracture: Secondary | ICD-10-CM | POA: Insufficient documentation

## 2016-11-23 DIAGNOSIS — Y92410 Unspecified street and highway as the place of occurrence of the external cause: Secondary | ICD-10-CM | POA: Insufficient documentation

## 2016-11-23 DIAGNOSIS — J45909 Unspecified asthma, uncomplicated: Secondary | ICD-10-CM | POA: Insufficient documentation

## 2016-11-23 DIAGNOSIS — Y9389 Activity, other specified: Secondary | ICD-10-CM | POA: Insufficient documentation

## 2016-11-23 DIAGNOSIS — R4182 Altered mental status, unspecified: Secondary | ICD-10-CM | POA: Insufficient documentation

## 2016-11-23 DIAGNOSIS — S0990XA Unspecified injury of head, initial encounter: Secondary | ICD-10-CM | POA: Insufficient documentation

## 2016-11-23 DIAGNOSIS — S32009A Unspecified fracture of unspecified lumbar vertebra, initial encounter for closed fracture: Secondary | ICD-10-CM | POA: Insufficient documentation

## 2016-11-23 DIAGNOSIS — Y999 Unspecified external cause status: Secondary | ICD-10-CM | POA: Insufficient documentation

## 2016-11-23 LAB — CBC WITH DIFFERENTIAL/PLATELET
BASOS ABS: 0 10*3/uL (ref 0.0–0.1)
BASOS PCT: 0 %
EOS ABS: 0.2 10*3/uL (ref 0.0–0.7)
Eosinophils Relative: 3 %
HCT: 37.7 % (ref 36.0–46.0)
HEMOGLOBIN: 12.8 g/dL (ref 12.0–15.0)
Lymphocytes Relative: 17 %
Lymphs Abs: 1.4 10*3/uL (ref 0.7–4.0)
MCH: 30 pg (ref 26.0–34.0)
MCHC: 34 g/dL (ref 30.0–36.0)
MCV: 88.5 fL (ref 78.0–100.0)
Monocytes Absolute: 0.6 10*3/uL (ref 0.1–1.0)
Monocytes Relative: 7 %
NEUTROS ABS: 6.2 10*3/uL (ref 1.7–7.7)
NEUTROS PCT: 73 %
Platelets: 277 10*3/uL (ref 150–400)
RBC: 4.26 MIL/uL (ref 3.87–5.11)
RDW: 13.7 % (ref 11.5–15.5)
WBC: 8.4 10*3/uL (ref 4.0–10.5)

## 2016-11-23 LAB — URINALYSIS, ROUTINE W REFLEX MICROSCOPIC
Bilirubin Urine: NEGATIVE
GLUCOSE, UA: NEGATIVE mg/dL
Hgb urine dipstick: NEGATIVE
Ketones, ur: NEGATIVE mg/dL
LEUKOCYTES UA: NEGATIVE
Nitrite: NEGATIVE
PH: 6 (ref 5.0–8.0)
Protein, ur: NEGATIVE mg/dL
Specific Gravity, Urine: 1.04 — ABNORMAL HIGH (ref 1.005–1.030)

## 2016-11-23 LAB — PROTIME-INR
INR: 0.95
Prothrombin Time: 12.6 seconds (ref 11.4–15.2)

## 2016-11-23 LAB — COMPREHENSIVE METABOLIC PANEL
ALBUMIN: 3.6 g/dL (ref 3.5–5.0)
ALK PHOS: 72 U/L (ref 38–126)
ALT: 36 U/L (ref 14–54)
ANION GAP: 8 (ref 5–15)
AST: 62 U/L — ABNORMAL HIGH (ref 15–41)
BILIRUBIN TOTAL: 0.5 mg/dL (ref 0.3–1.2)
BUN: 8 mg/dL (ref 6–20)
CALCIUM: 8.9 mg/dL (ref 8.9–10.3)
CO2: 26 mmol/L (ref 22–32)
Chloride: 107 mmol/L (ref 101–111)
Creatinine, Ser: 0.68 mg/dL (ref 0.44–1.00)
GFR calc Af Amer: >60 mL/min (ref >60)
GLUCOSE: 79 mg/dL (ref 65–99)
Potassium: 3.7 mmol/L (ref 3.5–5.1)
Sodium: 141 mmol/L (ref 135–145)
TOTAL PROTEIN: 6.9 g/dL (ref 6.5–8.1)

## 2016-11-23 LAB — RAPID URINE DRUG SCREEN, HOSP PERFORMED
AMPHETAMINES: NOT DETECTED
BENZODIAZEPINES: POSITIVE — AB
Barbiturates: NOT DETECTED
COCAINE: NOT DETECTED
OPIATES: POSITIVE — AB
TETRAHYDROCANNABINOL: NOT DETECTED

## 2016-11-23 LAB — ETHANOL: Alcohol, Ethyl (B): <10 mg/dL (ref <10)

## 2016-11-23 LAB — I-STAT BETA HCG BLOOD, ED (MC, WL, AP ONLY)

## 2016-11-23 LAB — CBG MONITORING, ED: GLUCOSE-CAPILLARY: 88 mg/dL (ref 65–99)

## 2016-11-23 MED ORDER — IOPAMIDOL (ISOVUE-300) INJECTION 61%
INTRAVENOUS | Status: AC
  Administered 2016-11-23: 100 mL
  Filled 2016-11-23: qty 100

## 2016-11-23 MED ORDER — HYDROCODONE-ACETAMINOPHEN 5-325 MG PO TABS: 1.0000 | ORAL_TABLET | Freq: Once | ORAL | Status: DC

## 2016-11-23 MED ORDER — SODIUM CHLORIDE 0.9 % IV BOLUS (SEPSIS)
1000.0000 mL | Freq: Once | INTRAVENOUS | Status: AC
  Administered 2016-11-23: 1000 mL via INTRAVENOUS

## 2016-11-23 MED ORDER — IBUPROFEN 400 MG PO TABS
600.0000 mg | ORAL_TABLET | Freq: Once | ORAL | Status: AC
  Administered 2016-11-23: 600 mg via ORAL
  Filled 2016-11-23: qty 1
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
## 2016-11-23 NOTE — ED Notes (Signed)
Patient ambulated to restroom. Able to ambulate with steady gait pt endorses pain all over.
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
## 2016-11-23 NOTE — ED Notes (Signed)
EDP at bedside and aware of pts pain, no new orders

## 2016-12-17 ENCOUNTER — Emergency Department (HOSPITAL_COMMUNITY)
Admission: EM | Admit: 2016-12-17 | Discharge: 2016-12-18 | Disposition: A | Discharge status: Home / Self Care | Payer: Self-pay | Attending: Emergency Medicine | Admitting: Emergency Medicine

## 2016-12-17 ENCOUNTER — Encounter (HOSPITAL_COMMUNITY): Payer: Self-pay | Admitting: Emergency Medicine

## 2016-12-17 DIAGNOSIS — M79672 Pain in left foot: Secondary | ICD-10-CM | POA: Insufficient documentation

## 2016-12-17 DIAGNOSIS — Z79899 Other long term (current) drug therapy: Secondary | ICD-10-CM | POA: Insufficient documentation

## 2016-12-17 DIAGNOSIS — J45909 Unspecified asthma, uncomplicated: Secondary | ICD-10-CM | POA: Insufficient documentation

## 2016-12-17 LAB — CBC WITH DIFFERENTIAL/PLATELET
Basophils Absolute: 0 10*3/uL (ref 0.0–0.1)
Basophils Relative: 0 %
EOS PCT: 5 %
Eosinophils Absolute: 0.5 10*3/uL (ref 0.0–0.7)
HCT: 35.8 % — ABNORMAL LOW (ref 36.0–46.0)
Hemoglobin: 11.8 g/dL — ABNORMAL LOW (ref 12.0–15.0)
LYMPHS ABS: 2.3 10*3/uL (ref 0.7–4.0)
LYMPHS PCT: 24 %
MCH: 29.1 pg (ref 26.0–34.0)
MCHC: 33 g/dL (ref 30.0–36.0)
MCV: 88.4 fL (ref 78.0–100.0)
MONO ABS: 0.9 10*3/uL (ref 0.1–1.0)
Monocytes Relative: 9 %
Neutro Abs: 5.8 10*3/uL (ref 1.7–7.7)
Neutrophils Relative %: 62 %
PLATELETS: 372 10*3/uL (ref 150–400)
RBC: 4.05 MIL/uL (ref 3.87–5.11)
RDW: 13.3 % (ref 11.5–15.5)
WBC: 9.5 10*3/uL (ref 4.0–10.5)

## 2016-12-17 LAB — COMPREHENSIVE METABOLIC PANEL
ALT: 15 U/L (ref 14–54)
AST: 21 U/L (ref 15–41)
Albumin: 3.2 g/dL — ABNORMAL LOW (ref 3.5–5.0)
Alkaline Phosphatase: 102 U/L (ref 38–126)
Anion gap: 7 (ref 5–15)
BILIRUBIN TOTAL: 0.3 mg/dL (ref 0.3–1.2)
BUN: 11 mg/dL (ref 6–20)
CHLORIDE: 101 mmol/L (ref 101–111)
CO2: 28 mmol/L (ref 22–32)
CREATININE: 0.67 mg/dL (ref 0.44–1.00)
Calcium: 9 mg/dL (ref 8.9–10.3)
Glucose, Bld: 67 mg/dL (ref 65–99)
POTASSIUM: 4 mmol/L (ref 3.5–5.1)
Sodium: 136 mmol/L (ref 135–145)
TOTAL PROTEIN: 6.8 g/dL (ref 6.5–8.1)

## 2016-12-17 LAB — I-STAT CG4 LACTIC ACID, ED: LACTIC ACID, VENOUS: 1.2 mmol/L (ref 0.5–1.9)

## 2016-12-17 NOTE — ED Triage Notes (Signed)
Patient reports chronic left foot pain (>3 months) with swelling worse this past several days , denies injury , pain radiates to upper thigh , denies fever or chills .

## 2016-12-18 ENCOUNTER — Emergency Department (HOSPITAL_COMMUNITY): Payer: Self-pay

## 2016-12-18 MED ORDER — DOXYCYCLINE HYCLATE 100 MG PO CAPS: 100.0000 mg | ORAL_CAPSULE | Freq: Two times a day (BID) | ORAL | 0 refills | Status: DC

## 2016-12-18 NOTE — ED Notes (Signed)
Patient transported to X-ray 

## 2016-12-18 NOTE — ED Notes (Signed)
Pt verbalizes understanding of d/c instructions. Pt received prescriptions. Pt ambulatory at d/c with all belongings.  

## 2016-12-18 NOTE — ED Notes (Signed)
Patient brought back to room and placed on monitor. Patient is lying in bed saying "mommy, owie" repeatedly. Patient is complaining of a broken rib pain when lying in the bed.
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
## 2016-12-18 NOTE — Progress Notes (Addendum)
Patient has been given incentive spirometer as well as instructed on how to perform exercise.  Advised patient goal at this time is 1500.  Currently patient can only do 800.  Instructed patient to continue to use IS at least 10-15 Q1 hour while awake until she can reach her goal. Patient understands instructions.

## 2017-06-29 ENCOUNTER — Emergency Department (HOSPITAL_BASED_OUTPATIENT_CLINIC_OR_DEPARTMENT_OTHER): Payer: Self-pay

## 2017-06-29 ENCOUNTER — Emergency Department (HOSPITAL_COMMUNITY)
Admission: EM | Admit: 2017-06-29 | Discharge: 2017-06-30 | Disposition: A | Discharge status: Home / Self Care | Payer: Self-pay | Attending: Emergency Medicine | Admitting: Emergency Medicine

## 2017-06-29 ENCOUNTER — Encounter (HOSPITAL_BASED_OUTPATIENT_CLINIC_OR_DEPARTMENT_OTHER): Payer: Self-pay | Admitting: Family Medicine

## 2017-06-29 ENCOUNTER — Emergency Department (HOSPITAL_BASED_OUTPATIENT_CLINIC_OR_DEPARTMENT_OTHER)
Admission: EM | Admit: 2017-06-29 | Discharge: 2017-06-29 | Disposition: A | Discharge status: Home / Self Care | Payer: Self-pay | Attending: Emergency Medicine | Admitting: Emergency Medicine

## 2017-06-29 ENCOUNTER — Other Ambulatory Visit: Payer: Self-pay

## 2017-06-29 ENCOUNTER — Encounter (HOSPITAL_COMMUNITY): Payer: Self-pay

## 2017-06-29 DIAGNOSIS — F1110 Opioid abuse, uncomplicated: Secondary | ICD-10-CM | POA: Insufficient documentation

## 2017-06-29 DIAGNOSIS — L03116 Cellulitis of left lower limb: Secondary | ICD-10-CM | POA: Insufficient documentation

## 2017-06-29 DIAGNOSIS — Z79899 Other long term (current) drug therapy: Secondary | ICD-10-CM | POA: Insufficient documentation

## 2017-06-29 DIAGNOSIS — R600 Localized edema: Secondary | ICD-10-CM

## 2017-06-29 DIAGNOSIS — R609 Edema, unspecified: Secondary | ICD-10-CM

## 2017-06-29 DIAGNOSIS — F17210 Nicotine dependence, cigarettes, uncomplicated: Secondary | ICD-10-CM | POA: Insufficient documentation

## 2017-06-29 DIAGNOSIS — L03119 Cellulitis of unspecified part of limb: Secondary | ICD-10-CM

## 2017-06-29 DIAGNOSIS — J45909 Unspecified asthma, uncomplicated: Secondary | ICD-10-CM | POA: Insufficient documentation

## 2017-06-29 DIAGNOSIS — R0602 Shortness of breath: Secondary | ICD-10-CM | POA: Insufficient documentation

## 2017-06-29 DIAGNOSIS — R2243 Localized swelling, mass and lump, lower limb, bilateral: Secondary | ICD-10-CM | POA: Insufficient documentation

## 2017-06-29 DIAGNOSIS — R079 Chest pain, unspecified: Secondary | ICD-10-CM | POA: Insufficient documentation

## 2017-06-29 DIAGNOSIS — L03115 Cellulitis of right lower limb: Secondary | ICD-10-CM | POA: Insufficient documentation

## 2017-06-29 HISTORY — DX: Unspecified viral hepatitis C without hepatic coma: B19.20

## 2017-06-29 LAB — BASIC METABOLIC PANEL
ANION GAP: 4 — AB (ref 5–15)
BUN: 10 mg/dL (ref 6–20)
CO2: 29 mmol/L (ref 22–32)
Calcium: 8.4 mg/dL — ABNORMAL LOW (ref 8.9–10.3)
Chloride: 105 mmol/L (ref 101–111)
Creatinine, Ser: 0.69 mg/dL (ref 0.44–1.00)
Glucose, Bld: 109 mg/dL — ABNORMAL HIGH (ref 65–99)
POTASSIUM: 4.7 mmol/L (ref 3.5–5.1)
SODIUM: 138 mmol/L (ref 135–145)

## 2017-06-29 LAB — CBC
HEMATOCRIT: 34.3 % — AB (ref 36.0–46.0)
HEMOGLOBIN: 11.2 g/dL — AB (ref 12.0–15.0)
MCH: 29.6 pg (ref 26.0–34.0)
MCHC: 32.7 g/dL (ref 30.0–36.0)
MCV: 90.7 fL (ref 78.0–100.0)
Platelets: 256 10*3/uL (ref 150–400)
RBC: 3.78 MIL/uL — AB (ref 3.87–5.11)
RDW: 13.6 % (ref 11.5–15.5)
WBC: 4.4 10*3/uL (ref 4.0–10.5)

## 2017-06-29 LAB — BRAIN NATRIURETIC PEPTIDE: B Natriuretic Peptide: 45.4 pg/mL (ref 0.0–100.0)

## 2017-06-29 LAB — RAPID URINE DRUG SCREEN, HOSP PERFORMED
AMPHETAMINES: NOT DETECTED
BENZODIAZEPINES: NOT DETECTED
Barbiturates: NOT DETECTED
Cocaine: NOT DETECTED
OPIATES: POSITIVE — AB
Tetrahydrocannabinol: NOT DETECTED

## 2017-06-29 LAB — PREGNANCY, URINE: PREG TEST UR: NEGATIVE

## 2017-06-29 LAB — TROPONIN I

## 2017-06-29 MED ORDER — ALBUTEROL SULFATE HFA 108 (90 BASE) MCG/ACT IN AERS: 2.0000 | INHALATION_SPRAY | Freq: Four times a day (QID) | RESPIRATORY_TRACT | 2 refills | Status: DC | PRN

## 2017-06-29 MED ORDER — IPRATROPIUM-ALBUTEROL 0.5-2.5 (3) MG/3ML IN SOLN
3.0000 mL | Freq: Once | RESPIRATORY_TRACT | Status: AC
  Administered 2017-06-29: 3 mL via RESPIRATORY_TRACT
  Filled 2017-06-29: qty 3

## 2017-06-29 MED ORDER — ACETAMINOPHEN 325 MG PO TABS
650.0000 mg | ORAL_TABLET | Freq: Once | ORAL | Status: AC
  Administered 2017-06-29: 650 mg via ORAL
  Filled 2017-06-29: qty 2

## 2017-06-29 NOTE — Discharge Instructions (Addendum)
Seen today for leg swelling.  Her leg swelling is likely from standing too long 100 feet without any compression in your lower legs.  Please buy some compression stockings, you [facility].  If you are sitting down you will need to raise your feet to be at least to the level of your bellybutton.  This will prevent fluid from swelling in your legs.  You given you prescription for an albuterol inhaler that you can use you have any wheezing or shortness of breath.

## 2017-06-29 NOTE — ED Provider Notes (Signed)
[facility] EMERGENCY DEPARTMENT Provider Note   CSN: [id number] Arrival date & time: 06/29/17  [id number]     History   Chief Complaint Chief Complaint  Patient presents with  . Leg Swelling    HPI Christy Barnett is a 47 y.o. female PMH of asthma, Hep C, and heroin abuse here for  Leg swelling/Chest pain/Dyspnea  Having leg swelling for 1 day. Location: bilateral legs Timing of swelling: started yesterday and has been getting worse New medications: no Patient does work at [employer] and she stands all day on her feet, does not wear compression stockings or elevate her feet at home.  No history of immobility recently such as a long trip.   She denies being on any birth control. She does smoke tobacco.  States her last heroin use was 2 weeks ago.  History of Kidney problems: no History of Heart problems: no History of Liver problems: Hepatitis C History of cancer: no  Symptoms Chest pain: yes, left sided, nonradiating, "sharp".  Started last night and woke her up from sleep.  No alleviating or aggravating factors identified Shortness of breath: yes.  Intermittent for her "whole life" but has been present more so since the chest pain started Immobility: no Black or bloody bowel movements: no Severe snoring or day time sleepiness: no Abdomen swelling: yes History of blood clots: no Weight Loss: no    HPI  Past Medical History:  Diagnosis Date  . Asthma   . Bronchitis   . Hepatitis C   . Heroin abuse [facility](HCC)     Past Surgical History:  Procedure Laterality Date  . CESAREAN SECTION       OB History   None      Home Medications    Prior to Admission medications   Medication Sig Start Date End Date Taking? Authorizing Provider  albuterol (PROVENTIL HFA;VENTOLIN HFA) 108 (90 Base) MCG/ACT inhaler Inhale 2 puffs into the lungs every 6 (six) hours as needed for wheezing or shortness of breath. 06/29/17   Beaulah DinningGambino, Toretto Tingler M, MD  cyclobenzaprine  (FLEXERIL) 5 MG tablet Take 1 tablet (5 mg total) by mouth 3 (three) times daily as needed for muscle spasms. Patient not taking: Reported on 11/06/2016 01/11/16   Charlynne PanderYao, David Hsienta, MD  doxycycline (VIBRAMYCIN) 100 MG capsule Take 1 capsule (100 mg total) by mouth 2 (two) times daily. 12/18/16   Roxy HorsemanBrowning, Robert, PA-C  multivitamin-iron-minerals-folic acid (CENTRUM) chewable tablet Chew 1 tablet by mouth daily.    [provider]  naproxen (NAPROSYN) 500 MG tablet Take 1 tablet (500 mg total) by mouth 2 (two) times daily. Patient not taking: Reported on 11/06/2016 01/01/16   Derwood KaplanNanavati, Ankit, MD  pseudoephedrine (SUDAFED 12 HOUR) 120 MG 12 hr tablet Take 1 tablet (120 mg total) by mouth 2 (two) times daily. Patient not taking: Reported on 01/11/2016 08/20/14   Earley FavorSchulz, Gail, NP  traMADol (ULTRAM) 50 MG tablet Take 1 tablet (50 mg total) by mouth every 6 (six) hours as needed. Patient not taking: Reported on 11/06/2016 01/11/16   Charlynne PanderYao, David Hsienta, MD    Family History No family history on file.  Social History Social History   Tobacco Use  . Smoking status: Never Smoker  . Smokeless tobacco: Never Used  Substance Use Topics  . Alcohol use: No  . Drug use: No     Allergies   Patient has no known allergies.   Review of Systems Review of Systems  Constitutional: Negative for fatigue and  fever.  HENT: Negative for congestion and sore throat.   Respiratory: Positive for shortness of breath. Negative for cough, chest tightness and wheezing.   Cardiovascular: Positive for chest pain and leg swelling. Negative for palpitations.  Gastrointestinal: Positive for nausea. Negative for abdominal distention, abdominal pain, constipation, diarrhea and vomiting.  Genitourinary: Negative for dysuria.  Neurological: Negative for weakness.     Physical Exam Updated Vital Signs BP (!) 151/74   Pulse (!) 102   Temp 97.7 F (36.5 C) (Oral)   Resp (!) 22   Ht 5\' 1"  (1.549 m)   Wt  77.1 kg (170 lb)   SpO2 100%   BMI 32.12 kg/m   Physical Exam  Constitutional: She is oriented to person, place, and time. She appears well-developed and well-nourished.  HENT:  Head: Normocephalic.  Nose: Nose normal.  Mouth/Throat: Oropharynx is clear and moist.  Eyes: Pupils are equal, round, and reactive to light. Conjunctivae are normal.  Neck: Normal range of motion. Neck supple.  Cardiovascular: Normal rate, regular rhythm and normal heart sounds.  No murmur heard. Difficult to palpate DP pulses with pedal edema  Pulmonary/Chest: Effort normal. No respiratory distress. She has wheezes (faint expiratory wheeze throughout bilateral lung fields). She exhibits no tenderness.  Abdominal: Soft. Bowel sounds are normal. She exhibits distension. She exhibits no mass. There is no tenderness. There is no guarding.  Musculoskeletal: Normal range of motion. She exhibits edema (2+ pitting edema extending from knees down).  Neurological: She is alert and oriented to person, place, and time. No cranial nerve deficit or sensory deficit. She exhibits normal muscle tone.  Skin: Skin is warm and dry. Capillary refill takes less than 2 seconds. No rash noted.  Psychiatric: She has a normal mood and affect.     ED Treatments / Results  Labs (all labs ordered are listed, but only abnormal results are displayed) Labs Reviewed  BASIC METABOLIC PANEL - Abnormal; Notable for the following components:      Result Value   Glucose, Bld 109 (*)    Calcium 8.4 (*)    Anion gap 4 (*)    All other components within normal limits  CBC - Abnormal; Notable for the following components:   RBC 3.78 (*)    Hemoglobin 11.2 (*)    HCT 34.3 (*)    All other components within normal limits  RAPID URINE DRUG SCREEN, HOSP PERFORMED - Abnormal; Notable for the following components:   Opiates POSITIVE (*)    All other components within normal limits  TROPONIN I  PREGNANCY, URINE  BRAIN NATRIURETIC PEPTIDE     EKG EKG Interpretation  Date/Time:  Tuesday June 29 2017 07:05:24 EDT Ventricular Rate:  75 PR Interval:    QRS Duration: 98 QT Interval:  403 QTC Calculation: 451 R Axis:   44 Text Interpretation:  Normal sinus rhythm no acute ST/T changes no significant change since July 2018 Confirmed by Pricilla Loveless [phone]) on 06/29/2017 7:08:19 AM Also confirmed by Pricilla Loveless [phone]), editor Sheppard Evens ([id number])  on 06/29/2017 8:26:58 AM   Radiology Dg Chest 2 View  Result Date: 06/29/2017 CLINICAL DATA:  Chest pain.  Bilateral leg and feet swelling. EXAM: CHEST - 2 VIEW COMPARISON:  12/18/2016. FINDINGS: Mediastinum and hilar structures normal. Lungs are clear. No pleural effusion or pneumothorax. Degenerative change thoracic spine with thoracic spine scoliosis. IMPRESSION: No acute cardiopulmonary disease. Electronically Signed   By: Maisie Fus  Register   On: 06/29/2017 07:44    Procedures Procedures (  including critical care time)  Medications Ordered in ED Medications  ipratropium-albuterol (DUONEB) 0.5-2.5 (3) MG/3ML nebulizer solution 3 mL (3 mLs Nebulization Given 06/29/17 0751)  acetaminophen (TYLENOL) tablet 650 mg (650 mg Oral Given 06/29/17 0749)     Initial Impression / Assessment and Plan / ED Course  I have reviewed the triage vital signs and the nursing notes.  Pertinent labs & imaging results that were available during my care of the patient were reviewed by me and considered in my medical decision making (see chart for details).    47 year old female with PMH of heroin abuse, asthma, hepatitis C here for chest pain, shortness of breath, bilateral leg edema.  She has 2+ symmetric lower extremity pitting edema bilaterally, patient afebrile and no leukocytosis, less likely infectious process such as cellulitis of endocarditis. CXR clear and BNP normal, no signs of CHF exacerbation or pneumonia. Low suspicion DVT as swelling is symmetric and low suspicion for PE as  patient has no hypoxia or tachypnea. Initial EKG showing no ST changes or arrhythmias and Troponin negative. Heart score of 2, less likely ACS event.  UDS positive for opiates.  Chest pain likely MSK in etiology. She does have faint expiratory wheezing on lung exam (known tobacco abuse). Duoneb x 1 given with improvement in shortness of breath.  Tylenol given with improvement of chest pain.  She was instructed to elevate her feet and wear compression stockings at home.  She was also instructed to use an albuterol inhaler as needed for wheezing and shortness of breath.  At this time patient is stable for discharge.   Final Clinical Impressions(s) / ED Diagnoses   Final diagnoses:  Leg edema  Chest pain, unspecified type  Shortness of breath    ED Discharge Orders        Ordered    albuterol (PROVENTIL HFA;VENTOLIN HFA) 108 (90 Base) MCG/ACT inhaler  Every 6 hours PRN     06/29/17 0853       Beaulah Dinning, MD 06/29/17 1610    Pricilla Loveless, MD 06/29/17 1043

## 2017-06-29 NOTE — ED Triage Notes (Signed)
Pt c/o bilateral leg and feet swelling that started yesterday. Pt also c/o of some chest pain and SHOB. Pt states she has had this happen before but unsure of what caused it, she was prescribed fluid pills at that time

## 2017-06-29 NOTE — ED Triage Notes (Addendum)
Pt seen [facility] today [facility]. Pt has bilateral leg pain since yesterday. Pt has hx of taking water pills to relieve similar symptoms. Pt states [facility]. Pt states that she is concerned of cellulitis from nail salon.

## 2017-06-30 MED ORDER — SULFAMETHOXAZOLE-TRIMETHOPRIM 800-160 MG PO TABS
1.0000 | ORAL_TABLET | Freq: Once | ORAL | Status: AC
  Administered 2017-06-30: 1 via ORAL
  Filled 2017-06-30: qty 1

## 2017-06-30 MED ORDER — POTASSIUM CHLORIDE CRYS ER 20 MEQ PO TBCR: 20.0000 meq | EXTENDED_RELEASE_TABLET | Freq: Two times a day (BID) | ORAL | 0 refills | Status: DC | PRN

## 2017-06-30 MED ORDER — FUROSEMIDE 40 MG PO TABS
40.0000 mg | ORAL_TABLET | Freq: Once | ORAL | Status: AC
  Administered 2017-06-30: 40 mg via ORAL
  Filled 2017-06-30: qty 1

## 2017-06-30 MED ORDER — FUROSEMIDE 40 MG PO TABS: 40.0000 mg | ORAL_TABLET | Freq: Two times a day (BID) | ORAL | 0 refills | Status: DC | PRN

## 2017-06-30 MED ORDER — SULFAMETHOXAZOLE-TRIMETHOPRIM 800-160 MG PO TABS: 1.0000 | ORAL_TABLET | Freq: Two times a day (BID) | ORAL | 0 refills | Status: DC

## 2017-06-30 NOTE — ED Provider Notes (Signed)
[facility]-EMERGENCY DEPT Provider Note   CSN: [id number] Arrival date & time: 06/29/17  1743  Time seen 3:07 AM   History   Chief Complaint Chief Complaint  Patient presents with  . Leg Swelling    HPI Christy Barnett is a 47 y.o. female.  HPI patient is sleeping soundly.  When I awakened her and asked her what is going on tonight she states "my feet".  She states her leg started swelling yesterday and they are swollen up to her knees.  She also states she feels like her hands are starting to swell.  She states she has had this before and when I asked her what was the reason she states "I do not know.  She states in the past she has been treated with antibiotics for possible cellulitis and also has been on fluid pills.  She denies any recent travel.  She has been working for [employer] for about a year and has had no change in her job duties.  She states she has had chest pain and points to her left upper chest today.  She states that lasted a few hours this morning.  She states it was sharp.  When I had her take big deep breaths she also held her lower chest and stated she had some broken ribs in October that still hurt.  She states she does not take any medication on a regular basis.  She was seen in the ED at [facility] on June 11 and was discharged home with an inhaler.  [facility] [place]  Past Medical History:  Diagnosis Date  . Asthma   . Bronchitis   . Hepatitis C   . Heroin abuse (HCC)     There are no active problems to display for this patient.   Past Surgical History:  Procedure Laterality Date  . CESAREAN SECTION       OB History   None      Home Medications    Patient denies taking any medication on a regular basis.    Prior to Admission medications   Medication Sig Start Date End Date Taking? Authorizing Provider  albuterol (PROVENTIL HFA;VENTOLIN HFA) 108 (90 Base) MCG/ACT inhaler Inhale 2 puffs into the lungs  every 6 (six) hours as needed for wheezing or shortness of breath. 06/29/17   Beaulah Dinning, MD  cyclobenzaprine (FLEXERIL) 5 MG tablet Take 1 tablet (5 mg total) by mouth 3 (three) times daily as needed for muscle spasms. Patient not taking: Reported on 11/06/2016 01/11/16   Charlynne Pander, MD  doxycycline (VIBRAMYCIN) 100 MG capsule Take 1 capsule (100 mg total) by mouth 2 (two) times daily. 12/18/16   Roxy Horseman, PA-C  furosemide (LASIX) 40 MG tablet Take 1 tablet (40 mg total) by mouth 2 (two) times daily as needed. 06/30/17   Devoria Albe, MD  multivitamin-iron-minerals-folic acid (CENTRUM) chewable tablet Chew 1 tablet by mouth daily.    [provider]  naproxen (NAPROSYN) 500 MG tablet Take 1 tablet (500 mg total) by mouth 2 (two) times daily. Patient not taking: Reported on 11/06/2016 01/01/16   Derwood Kaplan, MD  potassium chloride SA (K-DUR,KLOR-CON) 20 MEQ tablet Take 1 tablet (20 mEq total) by mouth 2 (two) times daily as needed (with lasix). 06/30/17   Devoria Albe, MD  pseudoephedrine (SUDAFED 12 HOUR) 120 MG 12 hr tablet Take 1 tablet (120 mg total) by mouth 2 (two) times daily. Patient not taking: Reported on  01/11/2016 08/20/14   Earley Favor, NP  sulfamethoxazole-trimethoprim (BACTRIM DS,SEPTRA DS) 800-160 MG tablet Take 1 tablet by mouth 2 (two) times daily. 06/30/17   Devoria Albe, MD  traMADol (ULTRAM) 50 MG tablet Take 1 tablet (50 mg total) by mouth every 6 (six) hours as needed. Patient not taking: Reported on 11/06/2016 01/11/16   Charlynne Pander, MD    Family History No family history on file.  Social History Social History   Tobacco Use  . Smoking status: Current Every Day Smoker    Packs/day: 0.50    Types: Cigarettes  . Smokeless tobacco: Never Used  Substance Use Topics  . Alcohol use: No  . Drug use: No  employed   Allergies   Patient has no known allergies.   Review of Systems Review of Systems  All other systems reviewed and  are negative.    Physical Exam Updated Vital Signs BP 122/75 (BP Location: Right Arm)   Pulse 81   Temp 98.6 F (37 C) (Oral)   Resp 14   Ht 5\' 1"  (1.549 m)   Wt 77.1 kg (170 lb)   LMP 06/29/2017   SpO2 100%   BMI 32.12 kg/m   Vital signs normal   Physical Exam  Constitutional: She is oriented to person, place, and time. She appears well-developed and well-nourished.  Non-toxic appearance. She does not appear ill. No distress.  HENT:  Head: Normocephalic and atraumatic.  Right Ear: External ear normal.  Left Ear: External ear normal.  Nose: Nose normal. No mucosal edema or rhinorrhea.  Mouth/Throat: Oropharynx is clear and moist and mucous membranes are normal. No dental abscesses or uvula swelling.  Eyes: Pupils are equal, round, and reactive to light. Conjunctivae and EOM are normal.  Neck: Normal range of motion and full passive range of motion without pain. Neck supple.  Cardiovascular: Normal rate, regular rhythm and normal heart sounds. Exam reveals no gallop and no friction rub.  No murmur heard. Pulmonary/Chest: Effort normal and breath sounds normal. No respiratory distress. She has no wheezes. She has no rhonchi. She has no rales. She exhibits no tenderness and no crepitus.  Abdominal: Soft. Normal appearance and bowel sounds are normal. She exhibits no distension. There is no tenderness. There is no rebound and no guarding.  Musculoskeletal: Normal range of motion. She exhibits edema. She exhibits no tenderness.  Moves all extremities well.  Patient has marked edema of the dorsum of her feet and her ankles that extends up to her knees.  She is nontender in her thighs and without swelling.  When I feel her skin it feels warm and it slightly red in color in both legs.  Patient states her swelling is better since she is been laying down waiting to be seen.  Neurological: She is alert and oriented to person, place, and time. She has normal strength. No cranial nerve  deficit.  Skin: Skin is warm, dry and intact. No rash noted. No erythema. No pallor.  Psychiatric: She has a normal mood and affect. Her speech is normal and behavior is normal. Her mood appears not anxious.  Nursing note and vitals reviewed.    ED Treatments / Results  Labs (all labs ordered are listed, but only abnormal results are displayed) Results for orders placed or performed during the hospital encounter of 06/29/17  Basic metabolic panel  Result Value Ref Range   Sodium 138 135 - 145 mmol/L   Potassium 4.7 3.5 - 5.1 mmol/L   Chloride  105 101 - 111 mmol/L   CO2 29 22 - 32 mmol/L   Glucose, Bld 109 (H) 65 - 99 mg/dL   BUN 10 6 - 20 mg/dL   Creatinine, Ser 4.09 0.44 - 1.00 mg/dL   Calcium 8.4 (L) 8.9 - 10.3 mg/dL   GFR calc non Af Amer >60 >60 mL/min   GFR calc Af Amer >60 >60 mL/min   Anion gap 4 (L) 5 - 15  CBC  Result Value Ref Range   WBC 4.4 4.0 - 10.5 K/uL   RBC 3.78 (L) 3.87 - 5.11 MIL/uL   Hemoglobin 11.2 (L) 12.0 - 15.0 g/dL   HCT 81.1 (L) 91.4 - 78.2 %   MCV 90.7 78.0 - 100.0 fL   MCH 29.6 26.0 - 34.0 pg   MCHC 32.7 30.0 - 36.0 g/dL   RDW 95.6 21.3 - 08.6 %   Platelets 256 150 - 400 K/uL  Troponin I  Result Value Ref Range   Troponin I <0.03 <0.03 ng/mL  Pregnancy, urine  Result Value Ref Range   Preg Test, Ur NEGATIVE NEGATIVE  Rapid urine drug screen (hospital performed)  Result Value Ref Range   Opiates POSITIVE (A) NONE DETECTED   Cocaine NONE DETECTED NONE DETECTED   Benzodiazepines NONE DETECTED NONE DETECTED   Amphetamines NONE DETECTED NONE DETECTED   Tetrahydrocannabinol NONE DETECTED NONE DETECTED   Barbiturates NONE DETECTED NONE DETECTED  Brain natriuretic peptide  Result Value Ref Range   B Natriuretic Peptide 45.4 0.0 - 100.0 pg/mL   Laboratory interpretation all normal except mild anemia, + UDS despite telling me she is not on any regular meds.     EKG None  Radiology Dg Chest 2 View  Result Date: 06/29/2017 CLINICAL DATA:   Chest pain.  Bilateral leg and feet swelling. EXAM: CHEST - 2 VIEW COMPARISON:  12/18/2016. FINDINGS: Mediastinum and hilar structures normal. Lungs are clear. No pleural effusion or pneumothorax. Degenerative change thoracic spine with thoracic spine scoliosis. IMPRESSION: No acute cardiopulmonary disease. Electronically Signed   By: Maisie Fus  Register   On: 06/29/2017 07:44    Procedures Procedures (including critical care time)  Medications Ordered in ED Medications  sulfamethoxazole-trimethoprim (BACTRIM DS,SEPTRA DS) 800-160 MG per tablet 1 tablet (has no administration in time range)  furosemide (LASIX) tablet 40 mg (has no administration in time range)     Initial Impression / Assessment and Plan / ED Course  I have reviewed the triage vital signs and the nursing notes.  Pertinent labs & imaging results that were available during my care of the patient were reviewed by me and considered in my medical decision making (see chart for details).    Patient's legs look like she may have some early cellulitis.  I reviewed her laboratory testing from her ED visit yesterday.  She had no evidence for congestive heart failure.  She has left-sided chest pain however she also told me she broke some ribs in October and she still gets pain from that.  She is not tachypneic, hypoxic, or tachycardic, concern for PE is very low.  She was started on antibiotics and diuretic.   Final Clinical Impressions(s) / ED Diagnoses   Final diagnoses:  Peripheral edema  Cellulitis of lower extremity, unspecified laterality    ED Discharge Orders        Ordered    sulfamethoxazole-trimethoprim (BACTRIM DS,SEPTRA DS) 800-160 MG tablet  2 times daily     06/30/17 0340    furosemide (LASIX) 40 MG  tablet  2 times daily PRN     06/30/17 0342    potassium chloride SA (K-DUR,KLOR-CON) 20 MEQ tablet  2 times daily PRN     06/30/17 0342     Plan discharge  Devoria AlbeIva Xee Hollman, MD, [facility]    Bobbye Reinitz, MD 06/30/17  [id number]

## 2017-06-30 NOTE — Discharge Instructions (Addendum)
Try to elevate your legs. Take the antibiotic until gone. Use the lasix with the potassium pills for the swelling. Return to the ED if you get worse, such as fever, struggling to breathe or worsening swelling.

## 2017-07-09 ENCOUNTER — Emergency Department (HOSPITAL_COMMUNITY): Payer: Self-pay

## 2017-07-09 ENCOUNTER — Emergency Department (HOSPITAL_COMMUNITY)
Admission: EM | Admit: 2017-07-09 | Discharge: 2017-07-09 | Disposition: A | Discharge status: Home / Self Care | Payer: Self-pay | Attending: Emergency Medicine | Admitting: Emergency Medicine

## 2017-07-09 ENCOUNTER — Encounter (HOSPITAL_COMMUNITY): Payer: Self-pay

## 2017-07-09 DIAGNOSIS — F17210 Nicotine dependence, cigarettes, uncomplicated: Secondary | ICD-10-CM | POA: Insufficient documentation

## 2017-07-09 DIAGNOSIS — J45909 Unspecified asthma, uncomplicated: Secondary | ICD-10-CM | POA: Insufficient documentation

## 2017-07-09 DIAGNOSIS — R0602 Shortness of breath: Secondary | ICD-10-CM | POA: Insufficient documentation

## 2017-07-09 DIAGNOSIS — R600 Localized edema: Secondary | ICD-10-CM | POA: Insufficient documentation

## 2017-07-09 DIAGNOSIS — R609 Edema, unspecified: Secondary | ICD-10-CM

## 2017-07-09 DIAGNOSIS — R072 Precordial pain: Secondary | ICD-10-CM | POA: Insufficient documentation

## 2017-07-09 LAB — HEPATIC FUNCTION PANEL
ALBUMIN: 3.4 g/dL — AB (ref 3.5–5.0)
ALT: 43 U/L (ref 14–54)
AST: 48 U/L — AB (ref 15–41)
Alkaline Phosphatase: 99 U/L (ref 38–126)
BILIRUBIN TOTAL: 0.4 mg/dL (ref 0.3–1.2)
Bilirubin, Direct: <0.1 mg/dL — ABNORMAL LOW (ref 0.1–0.5)
Total Protein: 6.2 g/dL — ABNORMAL LOW (ref 6.5–8.1)

## 2017-07-09 LAB — CBC
HCT: 35.4 % — ABNORMAL LOW (ref 36.0–46.0)
Hemoglobin: 11.1 g/dL — ABNORMAL LOW (ref 12.0–15.0)
MCH: 29 pg (ref 26.0–34.0)
MCHC: 31.4 g/dL (ref 30.0–36.0)
MCV: 92.4 fL (ref 78.0–100.0)
PLATELETS: 342 10*3/uL (ref 150–400)
RBC: 3.83 MIL/uL — ABNORMAL LOW (ref 3.87–5.11)
RDW: 13.6 % (ref 11.5–15.5)
WBC: 5.1 10*3/uL (ref 4.0–10.5)

## 2017-07-09 LAB — BASIC METABOLIC PANEL
Anion gap: 8 (ref 5–15)
BUN: 9 mg/dL (ref 6–20)
CHLORIDE: 103 mmol/L (ref 101–111)
CO2: 30 mmol/L (ref 22–32)
CREATININE: 0.98 mg/dL (ref 0.44–1.00)
Calcium: 8.9 mg/dL (ref 8.9–10.3)
GFR calc Af Amer: >60 mL/min (ref >60)
GFR calc non Af Amer: >60 mL/min (ref >60)
Glucose, Bld: 87 mg/dL (ref 65–99)
POTASSIUM: 4.1 mmol/L (ref 3.5–5.1)
Sodium: 141 mmol/L (ref 135–145)

## 2017-07-09 LAB — I-STAT BETA HCG BLOOD, ED (MC, WL, AP ONLY): I-stat hCG, quantitative: <5 m[IU]/mL (ref <5)

## 2017-07-09 LAB — I-STAT TROPONIN, ED: Troponin i, poc: 0.01 ng/mL (ref 0.00–0.08)

## 2017-07-09 LAB — BRAIN NATRIURETIC PEPTIDE: B Natriuretic Peptide: 29.2 pg/mL (ref 0.0–100.0)

## 2017-07-09 LAB — D-DIMER, QUANTITATIVE (NOT AT ARMC): D DIMER QUANT: 1.99 ug{FEU}/mL — AB (ref 0.00–0.50)

## 2017-07-09 MED ORDER — IBUPROFEN 800 MG PO TABS
800.0000 mg | ORAL_TABLET | Freq: Once | ORAL | Status: AC
  Administered 2017-07-09: 800 mg via ORAL
  Filled 2017-07-09: qty 1

## 2017-07-09 MED ORDER — FUROSEMIDE 40 MG PO TABS: 40.0000 mg | ORAL_TABLET | Freq: Two times a day (BID) | ORAL | 0 refills | Status: DC | PRN

## 2017-07-09 MED ORDER — IOPAMIDOL (ISOVUE-370) INJECTION 76%
100.0000 mL | Freq: Once | INTRAVENOUS | Status: AC
  Administered 2017-07-09: 100 mL via INTRAVENOUS

## 2017-07-09 MED ORDER — NAPROXEN 500 MG PO TABS: 500.0000 mg | ORAL_TABLET | Freq: Two times a day (BID) | ORAL | 0 refills | Status: DC

## 2017-07-09 MED ORDER — IOPAMIDOL (ISOVUE-370) INJECTION 76%
INTRAVENOUS | Status: AC
  Filled 2017-07-09: qty 100

## 2017-07-09 NOTE — ED Provider Notes (Signed)
[facility] Provider Note   CSN: [id number] Arrival date & time: 07/09/17  0134     History   Chief Complaint Chief Complaint  Patient presents with  . Chest Pain    HPI Christy Barnett is a 47 y.o. female.  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  Patient was initially seen 10 days ago for the lower extremity edema.  At that time her labs were unremarkable and she was discharged home with Lasix.  She states she did take the diuretic but it just made her pee and the swelling in her legs did not improve.  Over the last 3 days the swelling has persisted and her legs hurt.  She also states that last night she began feeling chest pain again in the left substernal area of her chest.  It is worse when she takes a deep breath.  She denies any productive cough or fever.  She has felt slightly short of breath.  She has no abdominal pain, nausea or vomiting.  Patient states 2 days ago she did snort heroin but again has not injected any for over 2 years.  She also has a history of hepatitis C which is not followed closely.  She does not know of a history of cirrhosis.  She denies any birth control use but did have a brother who died in his 6240s of a blood clot.  The history is provided by the patient.  Chest Pain   This is a recurrent problem. The current episode started 3 to 5 hours ago. The problem occurs constantly. The problem has not changed since onset.The pain is associated with breathing. The pain is present in the substernal region. The pain is at a severity of 4/10. The pain is moderate. The quality of the pain is described as sharp and pleuritic. The pain does not radiate. Associated symptoms include lower extremity edema and shortness of breath. Pertinent negatives include no  abdominal pain, no cough, no diaphoresis, no dizziness, no exertional chest pressure, no fever, no nausea and no palpitations. She has tried rest for the symptoms. The treatment provided no relief.    Past Medical History:  Diagnosis Date  . Asthma   . Bronchitis   . Hepatitis C   . Heroin abuse (HCC)     There are no active problems to display for this patient.   Past Surgical History:  Procedure Laterality Date  . CESAREAN SECTION       OB History   None      Home Medications    Prior to Admission medications   Medication Sig Start Date End Date Taking? Authorizing Provider  albuterol (PROVENTIL HFA;VENTOLIN HFA) 108 (90 Base) MCG/ACT inhaler Inhale 2 puffs into the lungs every 6 (six) hours as needed for wheezing or shortness of breath. 06/29/17  Yes Beaulah DinningGambino, Christina M, MD  furosemide (LASIX) 40 MG tablet Take 1 tablet (40 mg total) by mouth 2 (two) times daily as needed. Patient taking differently: Take 40 mg by mouth 2 (two) times daily as needed for fluid.  06/30/17  Yes Devoria AlbeKnapp, Iva, MD  potassium chloride SA (K-DUR,KLOR-CON) 20 MEQ tablet Take 1 tablet (20 mEq total) by mouth 2 (two) times daily as needed (with lasix). 06/30/17  Yes Devoria AlbeKnapp, Iva, MD  cyclobenzaprine (FLEXERIL)  5 MG tablet Take 1 tablet (5 mg total) by mouth 3 (three) times daily as needed for muscle spasms. Patient not taking: Reported on 11/06/2016 01/11/16   Charlynne Pander, MD  doxycycline (VIBRAMYCIN) 100 MG capsule Take 1 capsule (100 mg total) by mouth 2 (two) times daily. Patient not taking: Reported on 07/09/2017 12/18/16   Roxy Horseman, PA-C  naproxen (NAPROSYN) 500 MG tablet Take 1 tablet (500 mg total) by mouth 2 (two) times daily. Patient not taking: Reported on 11/06/2016 01/01/16   Derwood Kaplan, MD  pseudoephedrine (SUDAFED 12 HOUR) 120 MG 12 hr tablet Take 1 tablet (120 mg total) by mouth 2 (two) times daily. Patient not taking: Reported on 01/11/2016 08/20/14   Earley Favor, NP    sulfamethoxazole-trimethoprim (BACTRIM DS,SEPTRA DS) 800-160 MG tablet Take 1 tablet by mouth 2 (two) times daily. Patient not taking: Reported on 07/09/2017 06/30/17   Devoria Albe, MD  traMADol (ULTRAM) 50 MG tablet Take 1 tablet (50 mg total) by mouth every 6 (six) hours as needed. Patient not taking: Reported on 11/06/2016 01/11/16   Charlynne Pander, MD    Family History No family history on file.  Social History Social History   Tobacco Use  . Smoking status: Current Every Day Smoker    Packs/day: 0.50    Types: Cigarettes  . Smokeless tobacco: Never Used  Substance Use Topics  . Alcohol use: No  . Drug use: No     Allergies   Patient has no known allergies.   Review of Systems Review of Systems  Constitutional: Negative for diaphoresis and fever.  Respiratory: Positive for shortness of breath. Negative for cough.   Cardiovascular: Positive for chest pain. Negative for palpitations.  Gastrointestinal: Negative for abdominal pain and nausea.  Neurological: Negative for dizziness.  All other systems reviewed and are negative.    Physical Exam Updated Vital Signs BP 116/65   Pulse 78   Temp (!) 97.3 F (36.3 C) (Oral)   Resp 19   LMP 06/29/2017   SpO2 98%   Physical Exam  Constitutional: She is oriented to person, place, and time. She appears well-developed and well-nourished. No distress.  Appears older than her stated age  HENT:  Head: Normocephalic and atraumatic.  Mouth/Throat: Oropharynx is clear and moist.  Eyes: Pupils are equal, round, and reactive to light. Conjunctivae and EOM are normal.  Neck: Normal range of motion. Neck supple.  Cardiovascular: Normal rate, regular rhythm and intact distal pulses.  No murmur heard. Pulmonary/Chest: Effort normal and breath sounds normal. No respiratory distress. She has no wheezes. She has no rales.  Abdominal: Soft. She exhibits no distension. There is no tenderness. There is no rebound and no guarding.   Musculoskeletal: Normal range of motion. She exhibits no tenderness.       Right lower leg: She exhibits edema.       Left lower leg: She exhibits edema.  3+ nonpitting edema up to the thigh bilaterally.  The skin is slightly red but no warmth.  Varicose veins are present throughout both legs.  2+ DP pulse bilaterally.  Neurological: She is alert and oriented to person, place, and time.  Skin: Skin is warm and dry. Capillary refill takes less than 2 seconds. No rash noted. No erythema.  Psychiatric: She has a normal mood and affect. Her behavior is normal.  Nursing note and vitals reviewed.    ED Treatments / Results  Labs (all labs ordered are listed, but only abnormal results  are displayed) Labs Reviewed  CBC - Abnormal; Notable for the following components:      Result Value   RBC 3.83 (*)    Hemoglobin 11.1 (*)    HCT 35.4 (*)    All other components within normal limits  HEPATIC FUNCTION PANEL - Abnormal; Notable for the following components:   Total Protein 6.2 (*)    Albumin 3.4 (*)    AST 48 (*)    Bilirubin, Direct <0.1 (*)    All other components within normal limits  D-DIMER, QUANTITATIVE (NOT AT [facility]) - Abnormal; Notable for the following components:   D-Dimer, Quant 1.99 (*)    All other components within normal limits  BASIC METABOLIC PANEL  BRAIN NATRIURETIC PEPTIDE  I-STAT TROPONIN, ED  I-STAT BETA HCG BLOOD, ED ([facility], [facility], [facility] ONLY)    EKG EKG Interpretation  Date/Time:  Friday July 09 2017 01:38:41 EDT Ventricular Rate:  84 PR Interval:  130 QRS Duration: 92 QT Interval:  360 QTC Calculation: 425 R Axis:   36 Text Interpretation:  Normal sinus rhythm Septal infarct , age undetermined No significant change since last tracing Confirmed by Gwyneth Sprout ([id number]) on 07/09/2017 5:56:15 AM   Radiology Dg Chest 2 View  Result Date: 07/09/2017 CLINICAL DATA:  Chest pain and shortness of breath woke the patient up from sleep about our ago. Bilateral leg  swelling and redness. EXAM: CHEST - 2 VIEW COMPARISON:  06/29/2017 FINDINGS: Normal heart size and pulmonary vascularity. No focal airspace disease or consolidation in the lungs. No blunting of costophrenic angles. No pneumothorax. Mediastinal contours appear intact. Degenerative changes in the spine and shoulders. Tiny calcified granulomas in the lung bases. IMPRESSION: No active cardiopulmonary disease. Electronically Signed   By: Burman Nieves M.D.   On: 07/09/2017 02:03    Procedures Procedures (including critical care time)  Medications Ordered in ED Medications - No data to display   Initial Impression / Assessment and Plan / ED Course  I have reviewed the triage vital signs and the nursing notes.  Pertinent labs & imaging results that were available during my care of the patient were reviewed by me and considered in my medical decision making (see chart for details).    Patient presenting today with persistent swelling in the lower extremities and chest pain.  Patient's pain is pleuritic and does not seem to be ACS related.  Her EKG no acute changes and patient was seen 10 days ago and had normal BNP, renal function and hemoglobin.  Low suspicion that patient's symptoms are related to CHF or renal disease.  Patient does have a history of hepatitis C and does not follow closely with anyone due to lack of insurance.  Concern for possible cirrhosis is the cause of patient's lower extremity edema.  Will send LFTs to evaluate her albumin, protein and liver function.  Also patient has a history of her brother who died in his 45s of a blood clot.  We will do a d-dimer to further evaluate.  Patient is otherwise low risk. Patient's d-dimer was elevated so we will do a CT for further evaluation. Pt checked out to [facility] at 0800  Final Clinical Impressions(s) / ED Diagnoses   Final diagnoses:  Edema, peripheral    ED Discharge Orders    None       Gwyneth Sprout, MD 07/10/17  [phone]

## 2017-07-09 NOTE — ED Notes (Signed)
Case management and SW to speak with the pt in lobby re: request for PCP and assistance for care of leg edema

## 2017-07-09 NOTE — Discharge Instructions (Signed)
Please follow up with primary care provider for further evaluation of your condition.  Return if you have any concerns.  Use resources provided.

## 2017-07-09 NOTE — ED Triage Notes (Signed)
Pt states that CP and SOB woke her up from her sleep about an hour ago, pt has severe swelling in both of her leg with redness. Denies n/v

## 2017-07-09 NOTE — Discharge Planning (Addendum)
Karinne Schmader J. Lucretia RoersWood, RN, BSN, [place] [phone]  [facility] @ 0920.  Spoke with pt at bedside and advised to please arrive 15 min early and take a picture ID and your current medications.  Pt verbalizes understanding of keeping appointment.

## 2017-07-09 NOTE — ED Provider Notes (Signed)
Received signout at the beginning of shift.  Patient here with bilateral leg swelling and having chest pain.  She has received antibiotic, as well as fluid pill for her leg swelling recently without improvement.  Her labs remarkable for an elevated d-dimer of 1.99.  A chest CT angiogram performed show no evidence of PE or pneumonia.  BNP is unremarkable.  Her liver function is without concerning value.  She does not have a primary care provider therefore I have discussed with our care manager who will help patient standing up with a PCP as she may benefit from further evaluation of her bilateral leg swelling.  Patient given ibuprofen for pain.  BP 116/65   Pulse 78   Temp (!) 97.3 F (36.3 C) (Oral)   Resp 19   LMP 06/29/2017   SpO2 98%   Results for orders placed or performed during the hospital encounter of 07/09/17  Basic metabolic panel  Result Value Ref Range   Sodium 141 135 - 145 mmol/L   Potassium 4.1 3.5 - 5.1 mmol/L   Chloride 103 101 - 111 mmol/L   CO2 30 22 - 32 mmol/L   Glucose, Bld 87 65 - 99 mg/dL   BUN 9 6 - 20 mg/dL   Creatinine, Ser 1.61 0.44 - 1.00 mg/dL   Calcium 8.9 8.9 - 09.6 mg/dL   GFR calc non Af Amer >60 >60 mL/min   GFR calc Af Amer >60 >60 mL/min   Anion gap 8 5 - 15  CBC  Result Value Ref Range   WBC 5.1 4.0 - 10.5 K/uL   RBC 3.83 (L) 3.87 - 5.11 MIL/uL   Hemoglobin 11.1 (L) 12.0 - 15.0 g/dL   HCT 04.5 (L) 40.9 - 81.1 %   MCV 92.4 78.0 - 100.0 fL   MCH 29.0 26.0 - 34.0 pg   MCHC 31.4 30.0 - 36.0 g/dL   RDW 91.4 78.2 - 95.6 %   Platelets 342 150 - 400 K/uL  Brain natriuretic peptide  Result Value Ref Range   B Natriuretic Peptide 29.2 0.0 - 100.0 pg/mL  Hepatic function panel  Result Value Ref Range   Total Protein 6.2 (L) 6.5 - 8.1 g/dL   Albumin 3.4 (L) 3.5 - 5.0 g/dL   AST 48 (H) 15 - 41 U/L   ALT 43 14 - 54 U/L   Alkaline Phosphatase 99 38 - 126 U/L   Total Bilirubin 0.4 0.3 - 1.2 mg/dL   Bilirubin, Direct <2.1 (L) 0.1 - 0.5 mg/dL    Indirect Bilirubin NOT CALCULATED 0.3 - 0.9 mg/dL  D-dimer, quantitative (not at [facility])  Result Value Ref Range   D-Dimer, Quant 1.99 (H) 0.00 - 0.50 ug/mL-FEU  I-stat troponin, ED  Result Value Ref Range   Troponin i, poc 0.01 0.00 - 0.08 ng/mL   Comment 3          I-Stat beta hCG blood, ED  Result Value Ref Range   I-stat hCG, quantitative <5.0 <5 mIU/mL   Comment 3           Dg Chest 2 View  Result Date: 07/09/2017 CLINICAL DATA:  Chest pain and shortness of breath woke the patient up from sleep about our ago. Bilateral leg swelling and redness. EXAM: CHEST - 2 VIEW COMPARISON:  06/29/2017 FINDINGS: Normal heart size and pulmonary vascularity. No focal airspace disease or consolidation in the lungs. No blunting of costophrenic angles. No pneumothorax. Mediastinal contours appear intact. Degenerative changes in the spine and  shoulders. Tiny calcified granulomas in the lung bases. IMPRESSION: No active cardiopulmonary disease. Electronically Signed   By: Burman NievesWilliam  Stevens M.D.   On: 07/09/2017 02:03   Dg Chest 2 View  Result Date: 06/29/2017 CLINICAL DATA:  Chest pain.  Bilateral leg and feet swelling. EXAM: CHEST - 2 VIEW COMPARISON:  12/18/2016. FINDINGS: Mediastinum and hilar structures normal. Lungs are clear. No pleural effusion or pneumothorax. Degenerative change thoracic spine with thoracic spine scoliosis. IMPRESSION: No acute cardiopulmonary disease. Electronically Signed   By: Maisie Fushomas  Register   On: 06/29/2017 07:44   Ct Angio Chest Pe W And/or Wo Contrast  Result Date: 07/09/2017 CLINICAL DATA:  Chest pain and shortness of breath EXAM: CT ANGIOGRAPHY CHEST WITH CONTRAST TECHNIQUE: Multidetector CT imaging of the chest was performed using the standard protocol during bolus administration of intravenous contrast. Multiplanar CT image reconstructions and MIPs were obtained to evaluate the vascular anatomy. CONTRAST:  100mL ISOVUE-370 IOPAMIDOL (ISOVUE-370) INJECTION 76% COMPARISON:   Chest CT November 12, 2016 and chest radiograph July 09, 2017 FINDINGS: Cardiovascular: There is no demonstrable pulmonary embolus. There is no thoracic aortic aneurysm or dissection. The visualized great vessels appear normal. The right innominate and left common carotid arteries arise as a common trunk, an anatomic variant. The left vertebral artery arises directly from the aortic arch, an anatomic variant. There is no appreciable pericardial effusion or pericardial thickening evident. Mediastinum/Nodes: Thyroid appears unremarkable. There are occasional subcentimeter mediastinal lymph nodes. There is no adenopathy by size criteria in the thoracic region. No esophageal lesions are evident. Lungs/Pleura: There is no edema or consolidation. No evident pleural effusion or pleural thickening. Upper Abdomen: Visualized upper abdominal structures appear unremarkable. Musculoskeletal: There is evidence of a wedge compression fracture involving the T8 vertebral body, not present on prior chest CT examination. There is a Schmorl's node along the superior aspect of the T8 vertebral body. No other fracture evident. No blastic or lytic bone lesions are evident. No chest wall lesions are appreciable. Review of the MIP images confirms the above findings. IMPRESSION: 1. No demonstrable pulmonary embolus. No thoracic aortic aneurysm or dissection. 2.  No lung edema or consolidation.  No pleural effusion. 3.  No appreciable thoracic adenopathy. 4. Anterior wedging of the T8 vertebral body, age uncertain but not present on prior CT from October 2018. Electronically Signed   By: Bretta BangWilliam  Woodruff III M.D.   On: 07/09/2017 08:06      Fayrene Helperran, Lacora Folmer, PA-C 07/09/17 [id number]    Gwyneth SproutPlunkett, Whitney, MD 07/10/17 [phone]

## 2017-07-16 ENCOUNTER — Ambulatory Visit (INDEPENDENT_AMBULATORY_CARE_PROVIDER_SITE_OTHER): Payer: Self-pay | Admitting: Family Medicine

## 2017-07-16 ENCOUNTER — Encounter: Payer: Self-pay | Admitting: Family Medicine

## 2017-07-16 VITALS — BP 125/60 | HR 88 | Temp 97.8°F | Resp 16 | Ht 62.0 in | Wt 182.0 lb

## 2017-07-16 DIAGNOSIS — Z87898 Personal history of other specified conditions: Secondary | ICD-10-CM

## 2017-07-16 DIAGNOSIS — F4310 Post-traumatic stress disorder, unspecified: Secondary | ICD-10-CM | POA: Insufficient documentation

## 2017-07-16 DIAGNOSIS — R600 Localized edema: Secondary | ICD-10-CM

## 2017-07-16 DIAGNOSIS — D649 Anemia, unspecified: Secondary | ICD-10-CM | POA: Insufficient documentation

## 2017-07-16 DIAGNOSIS — F3170 Bipolar disorder, currently in remission, most recent episode unspecified: Secondary | ICD-10-CM | POA: Insufficient documentation

## 2017-07-16 DIAGNOSIS — Z23 Encounter for immunization: Secondary | ICD-10-CM

## 2017-07-16 DIAGNOSIS — F1911 Other psychoactive substance abuse, in remission: Secondary | ICD-10-CM | POA: Insufficient documentation

## 2017-07-16 DIAGNOSIS — R011 Cardiac murmur, unspecified: Secondary | ICD-10-CM | POA: Insufficient documentation

## 2017-07-16 DIAGNOSIS — Z114 Encounter for screening for human immunodeficiency virus [HIV]: Secondary | ICD-10-CM

## 2017-07-16 DIAGNOSIS — Z8249 Family history of ischemic heart disease and other diseases of the circulatory system: Secondary | ICD-10-CM | POA: Insufficient documentation

## 2017-07-16 LAB — POCT URINALYSIS DIPSTICK
Bilirubin, UA: NEGATIVE
Blood, UA: NEGATIVE
Glucose, UA: NEGATIVE
Ketones, UA: NEGATIVE
Leukocytes, UA: NEGATIVE
Nitrite, UA: NEGATIVE
Protein, UA: NEGATIVE
Spec Grav, UA: 1.015 (ref 1.010–1.025)
Urobilinogen, UA: 1 E.U./dL
pH, UA: 7 (ref 5.0–8.0)

## 2017-07-16 MED ORDER — FUROSEMIDE 40 MG PO TABS: 40.0000 mg | ORAL_TABLET | Freq: Every day | ORAL | 0 refills | Status: DC

## 2017-07-16 NOTE — Patient Instructions (Signed)
Your lasix changed from 2 times a day to one time a day. Continue to take this even if the swelling resolves. Elevate your legs when you are sitting. Remember to wear your compression stocking and supportive shoes. Decrease the amount of salt and salty foods because salt will increase the swelling. The office will call you with the appointment for cardiology. [facility]Congratulations of being clean for 3 weeks! Keep up the good work.   I will see you in about 2 weeks for your Well Woman Exam and pap smear. I will call you with any abnormal lab results.
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
## 2017-07-17 LAB — BASIC METABOLIC PANEL
BUN / CREAT RATIO: 23 (ref 9–23)
BUN: 14 mg/dL (ref 6–24)
CO2: 25 mmol/L (ref 20–29)
CREATININE: 0.61 mg/dL (ref 0.57–1.00)
Calcium: 9.5 mg/dL (ref 8.7–10.2)
Chloride: 101 mmol/L (ref 96–106)
GFR calc Af Amer: 125 mL/min/{1.73_m2} (ref >59)
GFR, EST NON AFRICAN AMERICAN: 108 mL/min/{1.73_m2} (ref >59)
Glucose: 70 mg/dL (ref 65–99)
Potassium: 4.4 mmol/L (ref 3.5–5.2)
SODIUM: 141 mmol/L (ref 134–144)

## 2017-07-17 LAB — HIV ANTIBODY (ROUTINE TESTING W REFLEX): HIV Screen 4th Generation wRfx: NONREACTIVE

## 2017-07-30 ENCOUNTER — Ambulatory Visit: Payer: Self-pay | Admitting: Family Medicine

## 2017-08-02 ENCOUNTER — Encounter: Payer: Self-pay | Admitting: Family Medicine

## 2017-08-02 ENCOUNTER — Ambulatory Visit (INDEPENDENT_AMBULATORY_CARE_PROVIDER_SITE_OTHER): Payer: Self-pay | Admitting: Family Medicine

## 2017-08-02 ENCOUNTER — Other Ambulatory Visit: Payer: Self-pay

## 2017-08-02 ENCOUNTER — Telehealth: Payer: Self-pay

## 2017-08-02 VITALS — BP 127/76 | HR 72 | Temp 98.3°F | Resp 16 | Ht 62.0 in | Wt 169.0 lb

## 2017-08-02 DIAGNOSIS — Z01419 Encounter for gynecological examination (general) (routine) without abnormal findings: Secondary | ICD-10-CM

## 2017-08-02 DIAGNOSIS — Z113 Encounter for screening for infections with a predominantly sexual mode of transmission: Secondary | ICD-10-CM

## 2017-08-02 DIAGNOSIS — N898 Other specified noninflammatory disorders of vagina: Secondary | ICD-10-CM

## 2017-08-02 DIAGNOSIS — R600 Localized edema: Secondary | ICD-10-CM

## 2017-08-02 DIAGNOSIS — Z1231 Encounter for screening mammogram for malignant neoplasm of breast: Secondary | ICD-10-CM

## 2017-08-02 DIAGNOSIS — Z1239 Encounter for other screening for malignant neoplasm of breast: Secondary | ICD-10-CM

## 2017-08-02 MED ORDER — FLUCONAZOLE 150 MG PO TABS: 150.0000 mg | ORAL_TABLET | Freq: Once | ORAL | 0 refills | Status: AC

## 2017-08-02 MED ORDER — METRONIDAZOLE 500 MG PO TABS: 500.0000 mg | ORAL_TABLET | Freq: Two times a day (BID) | ORAL | 0 refills | Status: AC

## 2017-08-02 MED ORDER — POTASSIUM CHLORIDE CRYS ER 20 MEQ PO TBCR: 20.0000 meq | EXTENDED_RELEASE_TABLET | Freq: Two times a day (BID) | ORAL | 0 refills | Status: DC | PRN

## 2017-08-02 MED ORDER — FUROSEMIDE 40 MG PO TABS: 40.0000 mg | ORAL_TABLET | Freq: Every day | ORAL | 2 refills | Status: DC

## 2017-08-02 NOTE — Progress Notes (Signed)
Subjective:     Christy Barnett is a 47 y.o. female here for a routine exam.  Current complaints: patient c/o vaginal discomfort.  Personal health questionnaire reviewed: yes.   Gynecologic History Patient's last menstrual period was 07/08/2017. Contraception: condoms Last Pap: unknown. Results were: unknown Last mammogram: none. Results were: n/a Obstetric History OB History  No data available     The following portions of the patient's history were reviewed and updated as appropriate: allergies, current medications, past family history, past medical history, past social history, past surgical history and problem list.  Review of Systems Constitutional: negative Respiratory: negative Cardiovascular: negative Genitourinary:positive for vaginal irritation  Neurological: negative Behavioral/Psych: negative    Objective:    General appearance: alert and no distress Head: Normocephalic, without obvious abnormality, atraumatic Breasts: normal appearance, no masses or tenderness, No nipple retraction or dimpling, No nipple discharge or bleeding, No axillary or supraclavicular adenopathy, Taught monthly breast self examination Abdomen: soft, non-tender; bowel sounds normal; no masses,  no organomegaly Pelvic: cervix normal in appearance, external genitalia normal, no adnexal masses or tenderness, no cervical motion tenderness, rectovaginal septum normal and uterus normal size, shape, and consistency There is a + whiff test. Mild thin white discharge.  Extremities: extremities normal, atraumatic, no cyanosis or edema Lymph nodes: Cervical, supraclavicular, and axillary nodes normal.    Assessment:  Encounter for annual routine gynecological examination - Plan: Lipid panel, TSH, CBC, Comprehensive metabolic panel, RPR  Vaginal discharge - Plan: Vaginitis/Vaginosis, DNA Probe  Screening examination for venereal disease - Plan: Vaginitis/Vaginosis, DNA Probe, RPR  Bilateral lower  extremity edema - Plan: furosemide (LASIX) 40 MG tablet  Screening for malignant neoplasm of breast - Plan: MM Digital Screening    Plan:  1. Vaginal discharge Start flagyl 500mg  BID x 7 days. Start diflucan 150 PO QD x 1 dose.  - Vaginitis/Vaginosis, DNA Probe  2. Encounter for annual routine gynecological examination - Lipid panel - TSH - CBC - Comprehensive metabolic panel - RPR  3. Screening examination for venereal disease  - Vaginitis/Vaginosis, DNA Probe - RPR  4. Bilateral lower extremity edema  - furosemide (LASIX) 40 MG tablet; Take 1 tablet (40 mg total) by mouth daily.  Dispense: 30 tablet; Refill: 2  5. Screening for malignant neoplasm of breast  - MM Digital Screening; Future   Education reviewed: depression evaluation, safe sex/STD prevention, self breast exams and smoking cessation. Contraception: condoms. Mammogram ordered. RTC in 3 months

## 2017-08-02 NOTE — Patient Instructions (Addendum)

## 2017-08-02 NOTE — Telephone Encounter (Signed)
Refill for potassium sent into pharmacy. Thanks!  

## 2017-08-03 LAB — COMPREHENSIVE METABOLIC PANEL
ALT: 17 IU/L (ref 0–32)
AST: 28 IU/L (ref 0–40)
Albumin/Globulin Ratio: 1.6 (ref 1.2–2.2)
Albumin: 4.4 g/dL (ref 3.5–5.5)
Alkaline Phosphatase: 73 IU/L (ref 39–117)
BUN/Creatinine Ratio: 12 (ref 9–23)
BUN: 10 mg/dL (ref 6–24)
Bilirubin Total: 0.4 mg/dL (ref 0.0–1.2)
CO2: 27 mmol/L (ref 20–29)
Calcium: 10.1 mg/dL (ref 8.7–10.2)
Chloride: 97 mmol/L (ref 96–106)
Creatinine, Ser: 0.82 mg/dL (ref 0.57–1.00)
GFR calc Af Amer: 99 mL/min/{1.73_m2} (ref >59)
GFR calc non Af Amer: 85 mL/min/{1.73_m2} (ref >59)
Globulin, Total: 2.7 g/dL (ref 1.5–4.5)
Glucose: 89 mg/dL (ref 65–99)
Potassium: 4.5 mmol/L (ref 3.5–5.2)
Sodium: 138 mmol/L (ref 134–144)
Total Protein: 7.1 g/dL (ref 6.0–8.5)

## 2017-08-03 LAB — CBC
Hematocrit: 38.1 % (ref 34.0–46.6)
Hemoglobin: 13.4 g/dL (ref 11.1–15.9)
MCH: 29.8 pg (ref 26.6–33.0)
MCHC: 35.2 g/dL (ref 31.5–35.7)
MCV: 85 fL (ref 79–97)
Platelets: 266 10*3/uL (ref 150–450)
RBC: 4.5 x10E6/uL (ref 3.77–5.28)
RDW: 14 % (ref 12.3–15.4)
WBC: 5 10*3/uL (ref 3.4–10.8)

## 2017-08-03 LAB — RPR: RPR Ser Ql: NONREACTIVE

## 2017-08-03 LAB — LIPID PANEL
Chol/HDL Ratio: 2.7 ratio (ref 0.0–4.4)
Cholesterol, Total: 180 mg/dL (ref 100–199)
HDL: 67 mg/dL (ref >39)
LDL Calculated: 96 mg/dL (ref 0–99)
Triglycerides: 83 mg/dL (ref 0–149)
VLDL Cholesterol Cal: 17 mg/dL (ref 5–40)

## 2017-08-03 LAB — TSH: TSH: 2.12 u[IU]/mL (ref 0.450–4.500)

## 2017-08-04 ENCOUNTER — Telehealth: Payer: Self-pay

## 2017-08-04 NOTE — Telephone Encounter (Signed)
-----   Message from Mike GipAndre Douglas, FNP sent at 08/04/2017 12:23 PM EDT ----- You labs look good. No medication changes. Pap is pending.

## 2017-08-04 NOTE — Telephone Encounter (Signed)
Called and spoke with patient, advised that all labs are good, no changes at this time to medications and pap is still pending. Thanks!

## 2017-08-05 LAB — VAGINITIS/VAGINOSIS, DNA PROBE
Candida Species: NEGATIVE
Gardnerella vaginalis: POSITIVE — AB
Trichomonas vaginosis: NEGATIVE

## 2017-08-06 LAB — PAP IG, CT-NG NAA, HPV HIGH-RISK
Chlamydia, Nuc. Acid Amp: NEGATIVE
Gonococcus by Nucleic Acid Amp: NEGATIVE
HPV, high-risk: NEGATIVE
PAP Smear Comment: 0

## 2017-08-23 ENCOUNTER — Other Ambulatory Visit: Payer: Self-pay

## 2017-09-03 ENCOUNTER — Ambulatory Visit (INDEPENDENT_AMBULATORY_CARE_PROVIDER_SITE_OTHER): Payer: Self-pay | Admitting: Cardiology

## 2017-09-03 ENCOUNTER — Encounter: Payer: Self-pay | Admitting: Cardiology

## 2017-09-03 VITALS — BP 90/54 | HR 74 | Ht 62.0 in | Wt 165.0 lb

## 2017-09-03 DIAGNOSIS — R600 Localized edema: Secondary | ICD-10-CM

## 2017-09-03 DIAGNOSIS — R072 Precordial pain: Secondary | ICD-10-CM

## 2017-09-03 DIAGNOSIS — Z716 Tobacco abuse counseling: Secondary | ICD-10-CM

## 2017-09-03 DIAGNOSIS — Z79899 Other long term (current) drug therapy: Secondary | ICD-10-CM

## 2017-09-03 NOTE — Patient Instructions (Signed)
Medication Instructions: Your physician recommends that you continue on your current medications as directed.     If you need a refill on your cardiac medications before your next appointment, please call your pharmacy.   Labwork: Your physician recommends that you return for lab work Today (BMP)   Procedures/Testing: Your physician has requested that you have an echocardiogram. Echocardiography is a painless test that uses sound waves to create images of your heart. It provides your doctor with information about the size and shape of your heart and how well your heart's chambers and valves are working. This procedure takes approximately one hour. There are no restrictions for this procedure. [address]   Follow-Up: Your physician wants you to follow-up as needed with Dr. Cristal Deerhristopher. Call our office at [phone] to schedule appointment.   Special Instructions:    Thank you for choosing [facility] at [facility]!!

## 2017-09-03 NOTE — Progress Notes (Signed)
Cardiology Office Note:    Date:  09/03/2017   ID:  Christy EeKerry A Martinezlopez, DOB [birth year], MRN [id number]  PCP:  Mike Gipouglas, Andre, FNP  Cardiologist:  Jodelle RedBridgette Ilisa Hayworth, MD PhD  Referring MD: Massie MaroonHollis, Lachina M, FNP   Chief Complaint  Patient presents with  . Follow-up  . Headache  . Shortness of Breath  . Chest Pain  . Edema    History of Present Illness:    Christy Barnett is a 47 y.o. female with a hx of asthma/bronchitis, hepatitis C, prior substance abuse who is seen as a new consult at the request of Julianne HandlerLachina Hollis for evaluation of lower extremity edema.  Patient concerns: foot swelling, blood pressure. She notes that she has had severe foot swelling for several months. She thought it was initially infection, was seen in the [facility] ER on 06/30/17 for this and received bactrim and furosemide. She notes that she has had this occur in the past and has been treated the same way. She had some improvement with furosemide.   Also having chest pain, has happened 8-10 times. Runs up the right side of her chest, is severe, squeezing.Chronic, not associated with exertion. Last episode was when she came into ER 3 months ago (see note 06/30/17). At that time, the pain was noted to be sharp and lasting for several hours at a time. She notes that she has had chest pain in the past with normal stress tests (see below). No associated nausea, diaphoresis, SOB, PND, orthopnea, syncope, or palpitations.  Has hepatitis C, tobacco smoker. Has been in detox, trying to remain sober. Has chronic shortness of breath, managed with inhalers. Notes that her blood pressure always runs low.  Past Medical History:  Diagnosis Date  . Asthma   . Bronchitis   . Hepatitis C   . Heroin abuse [facility](HCC)     Past Surgical History:  Procedure Laterality Date  . CESAREAN SECTION      Current Medications: Current Outpatient Medications on File Prior to Visit  Medication Sig  . albuterol (PROVENTIL  HFA;VENTOLIN HFA) 108 (90 Base) MCG/ACT inhaler Inhale 2 puffs into the lungs every 6 (six) hours as needed for wheezing or shortness of breath.  . furosemide (LASIX) 40 MG tablet Take 1 tablet (40 mg total) by mouth daily.  . Lurasidone HCl (LATUDA PO) Take by mouth.  . naproxen (NAPROSYN) 500 MG tablet Take 1 tablet (500 mg total) by mouth 2 (two) times daily.  . potassium chloride SA (K-DUR,KLOR-CON) 20 MEQ tablet Take 1 tablet (20 mEq total) by mouth 2 (two) times daily as needed (with lasix).   No current facility-administered medications on file prior to visit.      Allergies:   Patient has no known allergies.   Social History   Socioeconomic History  . Marital status: Widowed    Spouse name: Not on file  . Number of children: Not on file  . Years of education: Not on file  . Highest education level: Not on file  Occupational History  . Not on file  Social Needs  . Financial resource strain: Not on file  . Food insecurity:    Worry: Not on file    Inability: Not on file  . Transportation needs:    Medical: Not on file    Non-medical: Not on file  Tobacco Use  . Smoking status: Current Every Day Smoker    Packs/day: 0.50    Types: Cigarettes  . Smokeless tobacco: Never Used  Substance and Sexual Activity  . Alcohol use: No  . Drug use: No  . Sexual activity: Not on file  Lifestyle  . Physical activity:    Days per week: Not on file    Minutes per session: Not on file  . Stress: Not on file  Relationships  . Social connections:    Talks on phone: Not on file    Gets together: Not on file    Attends religious service: Not on file    Active member of club or organization: Not on file    Attends meetings of clubs or organizations: Not on file    Relationship status: Not on file  Other Topics Concern  . Not on file  Social History Narrative   ** Merged History Encounter **         Family History: The patient's family history includes COPD in her mother;  Heart disease in her father and paternal grandfather.  ROS:   Please see the history of present illness.  Additional pertinent ROS: Review of Systems  Constitutional: Negative for chills and fever.  HENT: Negative for ear pain and hearing loss.   Eyes: Negative for blurred vision and pain.  Respiratory: Positive for cough and shortness of breath.   Cardiovascular: Positive for chest pain and leg swelling. Negative for palpitations, orthopnea, claudication and PND.  Gastrointestinal: Negative for abdominal pain, blood in stool and melena.  Genitourinary: Negative for dysuria and hematuria.  Musculoskeletal: Positive for myalgias. Negative for falls.  Skin: Negative for rash.  Neurological: Positive for headaches. Negative for focal weakness and loss of consciousness.  Endo/Heme/Allergies: Does not bruise/bleed easily.     EKGs/Labs/Other Studies Reviewed:    The following studies were reviewed today: Echo stress 11/08/2015  Summary Functional capacity is normal for age/sex - 10 mets on the 2-min Bruce protocol Normal resting biventricular function (ejection fraction), with no resting segmental abnormality. Stress echocardiogram negative for ischemia with target HR achieved. Signature ------------------------------------------------------------------------------  Electronically signed by Vernona Riegerhomas Folk, MD, FACC(Interpreting physician) on  11/08/2015 02:31 PM ------------------------------------------------------------------------------  Rest  ECG  Normal sinus rhythm. Normal tracing  Stress  Stress Type: Exercise  Peak HR: 162 bpmHR Response: Normal  Peak BP: 150/100 mmHg BP Response: Normal  Predicted HR: 175 bpm HR BP Product: [id number]  % of predicted HR: 93 Max Exercise: 10.8 METS  Test Duration: 6.5 min  Reason for Termination: Fatigue Exercise Effort: Good  Perceived Exertion: 4  Stress Interpretation  Appropriate hemodynamic  response to exercise. No significant ST T wave  changes with exercise. EKG portion is negative for ischemia by diagnostic  criteria.  Results  ECG  No ST-T wave changes.  Symptoms  No cardiovascular symptoms with maximal exercise.  EKG:  EKG is ordered today.  The ekg ordered today demonstrates normal sinus rhythm  Recent Labs: 07/09/2017: B Natriuretic Peptide 29.2 08/02/2017: ALT 17; BUN 10; Creatinine, Ser 0.82; Hemoglobin 13.4; Platelets 266; Potassium 4.5; Sodium 138; TSH 2.120  Recent Lipid Panel    Component Value Date/Time   CHOL 180 08/02/2017 1045   TRIG 83 08/02/2017 1045   HDL 67 08/02/2017 1045   CHOLHDL 2.7 08/02/2017 1045   LDLCALC 96 08/02/2017 1045    Physical Exam:    VS:  BP (!) 90/54 (BP Location: Left Arm, Patient Position: Sitting, Cuff Size: Normal)   Pulse 74   Ht 5\' 2"  (1.575 m)   Wt 165 lb (74.8 kg)   BMI 30.18 kg/m  Wt Readings from Last 3 Encounters:  09/03/17 165 lb (74.8 kg)  08/02/17 169 lb (76.7 kg)  07/16/17 182 lb (82.6 kg)     GEN: Well nourished, well developed in no acute distress HEENT: Normal NECK: No JVD; No carotid bruits LYMPHATICS: No lymphadenopathy CARDIAC: regular rhythm, normal S1 and S2, no murmurs, rubs, gallops. Radial and DP pulses 2+ bilaterally. RESPIRATORY:  Clear to auscultation without rales, wheezing or rhonchi  ABDOMEN: Soft, non-tender, non-distended MUSCULOSKELETAL:  Bilateral 1-2+ LE edema SKIN: Warm and dry NEUROLOGIC:  Alert and oriented x 3 PSYCHIATRIC:  Normal affect   ASSESSMENT:    1. Bilateral lower extremity edema   2. Medication management   3. Precordial pain   4. Tobacco abuse counseling    PLAN:    1. Bilateral lower extremity edema: This responds well to lasix. Concern would be whether this is a heart issue or if it is another process. Her history of hepatitis C and low systemic blood pressures are concerning for potentially a liver process. She did have a comprehensive metabolic  panel in July that was unremarkable, so she would have to have a compensated picture for this to be a liver issue. Recent renal function panel is unremarkable. Urine dipstick was negative for protein in June/ TSH and CBC also recently normal. -echocardiogram to evaluate function and presence of structural heart disease.   2. Chest pain: has both typical and atypical features. Had prior stress in 2017 that was unremarkable, and she went almost 11 METs. -echo given that her pain is not exertional, question whether it is related to her fluid status. She wishes to pursue this prior to stress test. -if that is unrevealing and her discomfort persists, consider a repeat treadmill stress test.  Tobacco and substance use: she is currently remaining free of illicit substances, trying to remain tobacco free The patient was counseled on the dangers of tobacco use, and was advised to quit.  Reviewed strategies to maximize success, including removing cigarettes and smoking materials from environment and substitution of other forms of reinforcement.  Plan for follow up: as needed based on results of testing. Will follow up results by phone and plan next steps accordingly.  Medication Adjustments/Labs and Tests Ordered: Current medicines are reviewed at length with the patient today.  Concerns regarding medicines are outlined above.  Orders Placed This Encounter  Procedures  . Basic metabolic panel  . EKG 12-Lead  . ECHOCARDIOGRAM COMPLETE   No orders of the defined types were placed in this encounter.   Patient Instructions  Medication Instructions: Your physician recommends that you continue on your current medications as directed.     If you need a refill on your cardiac medications before your next appointment, please call your pharmacy.   Labwork: Your physician recommends that you return for lab work Today (BMP)   Procedures/Testing: Your physician has requested that you have an  echocardiogram. Echocardiography is a painless test that uses sound waves to create images of your heart. It provides your doctor with information about the size and shape of your heart and how well your heart's chambers and valves are working. This procedure takes approximately one hour. There are no restrictions for this procedure. [address]   Follow-Up: Your physician wants you to follow-up as needed with Dr. Cristal Deer. Call our office at [phone] to schedule appointment.   Special Instructions:    Thank you for choosing Heartcare at [facility]!!  Signed, Jodelle Red, MD PhD 09/03/2017 3:34 PM    [facility]

## 2017-09-04 LAB — BASIC METABOLIC PANEL
BUN/Creatinine Ratio: 16 (ref 9–23)
BUN: 12 mg/dL (ref 6–24)
CALCIUM: 9.7 mg/dL (ref 8.7–10.2)
CO2: 27 mmol/L (ref 20–29)
Chloride: 102 mmol/L (ref 96–106)
Creatinine, Ser: 0.75 mg/dL (ref 0.57–1.00)
GFR calc Af Amer: 110 mL/min/{1.73_m2} (ref >59)
GFR, EST NON AFRICAN AMERICAN: 95 mL/min/{1.73_m2} (ref >59)
GLUCOSE: 89 mg/dL (ref 65–99)
POTASSIUM: 4.3 mmol/L (ref 3.5–5.2)
Sodium: 144 mmol/L (ref 134–144)

## 2017-09-06 ENCOUNTER — Encounter: Payer: Self-pay | Admitting: Cardiology

## 2017-09-06 ENCOUNTER — Other Ambulatory Visit (HOSPITAL_COMMUNITY): Payer: Self-pay

## 2017-09-07 ENCOUNTER — Other Ambulatory Visit: Payer: Self-pay

## 2017-09-07 ENCOUNTER — Ambulatory Visit (HOSPITAL_COMMUNITY): Payer: Self-pay | Attending: Cardiovascular Disease

## 2017-09-07 DIAGNOSIS — Z720 Tobacco use: Secondary | ICD-10-CM | POA: Insufficient documentation

## 2017-09-07 DIAGNOSIS — I340 Nonrheumatic mitral (valve) insufficiency: Secondary | ICD-10-CM | POA: Insufficient documentation

## 2017-09-07 DIAGNOSIS — F1910 Other psychoactive substance abuse, uncomplicated: Secondary | ICD-10-CM | POA: Insufficient documentation

## 2017-09-07 DIAGNOSIS — R600 Localized edema: Secondary | ICD-10-CM | POA: Insufficient documentation

## 2017-09-07 DIAGNOSIS — D649 Anemia, unspecified: Secondary | ICD-10-CM | POA: Insufficient documentation

## 2017-09-07 DIAGNOSIS — R011 Cardiac murmur, unspecified: Secondary | ICD-10-CM | POA: Insufficient documentation

## 2017-10-16 ENCOUNTER — Emergency Department (HOSPITAL_BASED_OUTPATIENT_CLINIC_OR_DEPARTMENT_OTHER)
Admission: EM | Admit: 2017-10-16 | Discharge: 2017-10-17 | Disposition: A | Discharge status: Home / Self Care | Payer: Self-pay | Attending: Emergency Medicine | Admitting: Emergency Medicine

## 2017-10-16 ENCOUNTER — Other Ambulatory Visit: Payer: Self-pay

## 2017-10-16 ENCOUNTER — Encounter (HOSPITAL_BASED_OUTPATIENT_CLINIC_OR_DEPARTMENT_OTHER): Payer: Self-pay | Admitting: Adult Health

## 2017-10-16 DIAGNOSIS — J45909 Unspecified asthma, uncomplicated: Secondary | ICD-10-CM | POA: Insufficient documentation

## 2017-10-16 DIAGNOSIS — S51012A Laceration without foreign body of left elbow, initial encounter: Secondary | ICD-10-CM | POA: Insufficient documentation

## 2017-10-16 DIAGNOSIS — S61215A Laceration without foreign body of left ring finger without damage to nail, initial encounter: Secondary | ICD-10-CM | POA: Insufficient documentation

## 2017-10-16 DIAGNOSIS — Y999 Unspecified external cause status: Secondary | ICD-10-CM | POA: Insufficient documentation

## 2017-10-16 DIAGNOSIS — S60221A Contusion of right hand, initial encounter: Secondary | ICD-10-CM | POA: Insufficient documentation

## 2017-10-16 DIAGNOSIS — Z23 Encounter for immunization: Secondary | ICD-10-CM | POA: Insufficient documentation

## 2017-10-16 DIAGNOSIS — Y92410 Unspecified street and highway as the place of occurrence of the external cause: Secondary | ICD-10-CM | POA: Insufficient documentation

## 2017-10-16 DIAGNOSIS — T07XXXA Unspecified multiple injuries, initial encounter: Secondary | ICD-10-CM | POA: Insufficient documentation

## 2017-10-16 DIAGNOSIS — S161XXA Strain of muscle, fascia and tendon at neck level, initial encounter: Secondary | ICD-10-CM | POA: Insufficient documentation

## 2017-10-16 DIAGNOSIS — Y9389 Activity, other specified: Secondary | ICD-10-CM | POA: Insufficient documentation

## 2017-10-16 DIAGNOSIS — S060X9A Concussion with loss of consciousness of unspecified duration, initial encounter: Secondary | ICD-10-CM | POA: Insufficient documentation

## 2017-10-16 DIAGNOSIS — F17210 Nicotine dependence, cigarettes, uncomplicated: Secondary | ICD-10-CM | POA: Insufficient documentation
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
## 2017-10-17 ENCOUNTER — Emergency Department (HOSPITAL_BASED_OUTPATIENT_CLINIC_OR_DEPARTMENT_OTHER): Payer: Self-pay

## 2017-10-17 MED ORDER — LIDOCAINE-EPINEPHRINE (PF) 2 %-1:200000 IJ SOLN
10.0000 mL | Freq: Once | INTRAMUSCULAR | Status: DC
  Filled 2017-10-17: qty 10

## 2017-10-17 MED ORDER — LIDOCAINE HCL (PF) 1 % IJ SOLN
5.0000 mL | Freq: Once | INTRAMUSCULAR | Status: AC
  Administered 2017-10-17: 5 mL
  Filled 2017-10-17: qty 5

## 2017-10-17 MED ORDER — TETANUS-DIPHTH-ACELL PERTUSSIS 5-2.5-18.5 LF-MCG/0.5 IM SUSP: 0.5000 mL | Freq: Once | INTRAMUSCULAR | Status: DC

## 2017-10-17 MED ORDER — LIDOCAINE-EPINEPHRINE (PF) 2 %-1:200000 IJ SOLN
INTRAMUSCULAR | Status: AC
  Administered 2017-10-17: 10 mL
  Filled 2017-10-17: qty 10

## 2017-10-17 MED ORDER — LIDOCAINE-EPINEPHRINE (PF) 2 %-1:200000 IJ SOLN
20.0000 mL | Freq: Once | INTRAMUSCULAR | Status: DC
  Administered 2017-10-17: 10 mL
  Filled 2017-10-17: qty 20

## 2017-10-17 MED ORDER — TETANUS-DIPHTH-ACELL PERTUSSIS 5-2.5-18.5 LF-MCG/0.5 IM SUSP
0.5000 mL | Freq: Once | INTRAMUSCULAR | Status: AC
  Administered 2017-10-17: 0.5 mL via INTRAMUSCULAR
  Filled 2017-10-17: qty 0.5

## 2017-10-17 MED ORDER — CEPHALEXIN 500 MG PO CAPS: 500.0000 mg | ORAL_CAPSULE | Freq: Two times a day (BID) | ORAL | 0 refills | Status: DC

## 2017-10-17 MED ORDER — SODIUM BICARBONATE 4 % IV SOLN
5.0000 mL | Freq: Once | INTRAVENOUS | Status: AC
  Administered 2017-10-17: 5 mL via SUBCUTANEOUS
  Filled 2017-10-17: qty 5

## 2017-10-17 NOTE — Discharge Instructions (Addendum)
For the laceration on your left ring finger, you will need to have this examined by the hand specialist.  I am concerned that you may have an injury to the tendons of your finger.  Please keep the splint in place until it is seen by the hand specialist this week.  For the wound on your left elbow, you can change the dressing every day, please keep it clean and dry

## 2017-10-17 NOTE — ED Provider Notes (Signed)
Patient is now more awake and alert.  She is able to ambulate independently.  I reiterated the fact that she is to see orthopedic/hand surgery for finger injury.  Antibiotics have been sent to her pharmacy   Zadie Rhine, MD 10/17/17 [phone]
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
## 2017-10-17 NOTE — ED Notes (Signed)
Had 2 successful IV sticks but the pt pulled both IV's out. Attempted a 3rd time without success.

## 2017-10-17 NOTE — ED Notes (Signed)
Patient verbalizes understanding of discharge instructions. Opportunity for questioning and answers were provided. Armband removed by staff, pt discharged from the ED to home via POV.

## 2017-10-17 NOTE — ED Notes (Addendum)
Call pt's friend Elnita Maxwell at [phone] when the pt is up for discharge.

## 2017-10-17 NOTE — ED Notes (Signed)
Verbal order from Dr. Bebe Shaggy to cancel all labs because the pt's scans are normal.
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
## 2017-10-17 NOTE — ED Notes (Signed)
PMS intact before and after. Pt tolerated well. All questions answered. 

## 2017-10-29 ENCOUNTER — Emergency Department (HOSPITAL_COMMUNITY)
Admission: EM | Admit: 2017-10-29 | Discharge: 2017-10-31 | Disposition: A | Discharge status: Home / Self Care | Payer: Self-pay | Attending: Emergency Medicine | Admitting: Emergency Medicine

## 2017-10-29 DIAGNOSIS — F1120 Opioid dependence, uncomplicated: Secondary | ICD-10-CM | POA: Insufficient documentation

## 2017-10-29 DIAGNOSIS — R45851 Suicidal ideations: Secondary | ICD-10-CM | POA: Insufficient documentation

## 2017-10-29 DIAGNOSIS — J45909 Unspecified asthma, uncomplicated: Secondary | ICD-10-CM | POA: Insufficient documentation

## 2017-10-29 DIAGNOSIS — F17210 Nicotine dependence, cigarettes, uncomplicated: Secondary | ICD-10-CM | POA: Insufficient documentation

## 2017-10-29 DIAGNOSIS — S61215D Laceration without foreign body of left ring finger without damage to nail, subsequent encounter: Secondary | ICD-10-CM | POA: Insufficient documentation

## 2017-10-29 DIAGNOSIS — Z79899 Other long term (current) drug therapy: Secondary | ICD-10-CM | POA: Insufficient documentation

## 2017-10-29 DIAGNOSIS — F1114 Opioid abuse with opioid-induced mood disorder: Secondary | ICD-10-CM

## 2017-10-29 DIAGNOSIS — Z046 Encounter for general psychiatric examination, requested by authority: Secondary | ICD-10-CM | POA: Insufficient documentation

## 2017-10-29 DIAGNOSIS — X58XXXA Exposure to other specified factors, initial encounter: Secondary | ICD-10-CM | POA: Insufficient documentation

## 2017-10-29 LAB — COMPREHENSIVE METABOLIC PANEL
ALT: 15 U/L (ref 0–44)
AST: 24 U/L (ref 15–41)
Albumin: 3.6 g/dL (ref 3.5–5.0)
Alkaline Phosphatase: 56 U/L (ref 38–126)
Anion gap: 9 (ref 5–15)
BUN: 10 mg/dL (ref 6–20)
CHLORIDE: 107 mmol/L (ref 98–111)
CO2: 27 mmol/L (ref 22–32)
CREATININE: 0.64 mg/dL (ref 0.44–1.00)
Calcium: 9.2 mg/dL (ref 8.9–10.3)
GFR calc Af Amer: >60 mL/min (ref >60)
GFR calc non Af Amer: >60 mL/min (ref >60)
Glucose, Bld: 94 mg/dL (ref 70–99)
Potassium: 3.9 mmol/L (ref 3.5–5.1)
SODIUM: 143 mmol/L (ref 135–145)
Total Bilirubin: 0.4 mg/dL (ref 0.3–1.2)
Total Protein: 6.8 g/dL (ref 6.5–8.1)

## 2017-10-29 LAB — I-STAT BETA HCG BLOOD, ED (MC, WL, AP ONLY): I-stat hCG, quantitative: <5 m[IU]/mL (ref <5)

## 2017-10-29 LAB — CBC
HEMATOCRIT: 37.2 % (ref 36.0–46.0)
HEMOGLOBIN: 12 g/dL (ref 12.0–15.0)
MCH: 29.3 pg (ref 26.0–34.0)
MCHC: 32.3 g/dL (ref 30.0–36.0)
MCV: 91 fL (ref 80.0–100.0)
Platelets: 299 10*3/uL (ref 150–400)
RBC: 4.09 MIL/uL (ref 3.87–5.11)
RDW: 12.3 % (ref 11.5–15.5)
WBC: 5.1 10*3/uL (ref 4.0–10.5)
nRBC: 0 % (ref 0.0–0.2)

## 2017-10-29 LAB — ETHANOL: Alcohol, Ethyl (B): <10 mg/dL (ref <10)

## 2017-10-29 LAB — SALICYLATE LEVEL

## 2017-10-29 LAB — ACETAMINOPHEN LEVEL: Acetaminophen (Tylenol), Serum: <10 ug/mL — ABNORMAL LOW (ref 10–30)

## 2017-10-29 NOTE — ED Notes (Signed)
Patient BIB by [employer] in handcuffs. Handcuffs remove and patient walked into tx rm 26. Patient dressed out in scrubs and yellow sock and sitter at bedside. Patient refuse to answer assessment and questions at this time. Patient is crying and asking for her house keys. Patient house place in belonging bag in specimen cup.

## 2017-10-29 NOTE — ED Notes (Signed)
Presents with SI thoughts, plan to hang self.  Pt depressed and tearful. Calm at present, sleeping at present.  Denies HI or AVH.  A&O x 3, no distress noted, scabs noted to rt hand from fall. Monitoring for safety, Q 15 min checks in effect.  Pt admits to using Heroin today.

## 2017-10-29 NOTE — ED Notes (Signed)
Patient sleeping at this time.

## 2017-10-29 NOTE — ED Notes (Signed)
Per IVC papers "respondent stated she wanted to die. When she was asked if she wanted to hurt herself she stated yes. Petitioner asked her if she wanted to kill herself she said yes. Respondent stated she would probably hang herself".

## 2017-10-29 NOTE — ED Notes (Signed)
[place] sandwich and soda placed on bedside table. Patient has fallin asleep again.

## 2017-10-29 NOTE — ED Notes (Addendum)
Patient has one pair of black and white sneakers, pair of white socks, 1 pair of blue pants, 1 short sleeve black top, 1 long sleeve black top, 1 black bra, 1 silver color and gold colored necklace and green and white plastic bracelet, house keys placed in specimen cup and place in patient belonging bags.
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
## 2017-10-29 NOTE — ED Notes (Signed)
Per [employer] officer patient reported to them that she did a gram of heroin today.

## 2017-10-29 NOTE — ED Notes (Signed)
Patient reports SI/AH x 1 week. Patient denies HI/VH. Encouragement and support provided and safety maintain. 1:1 observation with sitter at bedside.

## 2017-10-29 NOTE — BH Assessment (Signed)
  BH Assessment  LPC attempted to complete BH Assessment. Pt was unable to be aroused long enough to answer questions.  Pt was prompted several times but only responded "I'm thirsty. I'm hungry." without opening her eyes. LPC asked pt if she wanted to complete assessment at a later time? Pt mumbled "yeah".  TTS will attempt assessment at a later time.  Brittani Purdum L. Razan Siler, MS, LPC, [facility] Therapeutic Triage Specialist  [phone]

## 2017-10-30 DIAGNOSIS — F1114 Opioid abuse with opioid-induced mood disorder: Secondary | ICD-10-CM

## 2017-10-30 LAB — RAPID URINE DRUG SCREEN, HOSP PERFORMED
AMPHETAMINES: NOT DETECTED
BARBITURATES: NOT DETECTED
Benzodiazepines: POSITIVE — AB
Cocaine: NOT DETECTED
OPIATES: POSITIVE — AB
Tetrahydrocannabinol: NOT DETECTED

## 2017-10-30 MED ORDER — CLONIDINE HCL 0.1 MG PO TABS: 0.1000 mg | ORAL_TABLET | ORAL | Status: DC

## 2017-10-30 MED ORDER — CLONIDINE HCL 0.1 MG PO TABS
0.1000 mg | ORAL_TABLET | Freq: Four times a day (QID) | ORAL | Status: DC
  Administered 2017-10-31 (×2): 0.1 mg via ORAL
  Filled 2017-10-30 (×3): qty 1

## 2017-10-30 MED ORDER — LURASIDONE HCL 40 MG PO TABS
40.0000 mg | ORAL_TABLET | Freq: Every day | ORAL | Status: DC
  Administered 2017-10-31: 40 mg via ORAL
  Filled 2017-10-30: qty 1

## 2017-10-30 MED ORDER — NAPROXEN 500 MG PO TABS
500.0000 mg | ORAL_TABLET | Freq: Two times a day (BID) | ORAL | Status: DC | PRN
  Administered 2017-10-31: 500 mg via ORAL
  Filled 2017-10-30: qty 1

## 2017-10-30 MED ORDER — DICYCLOMINE HCL 20 MG PO TABS: 20.0000 mg | ORAL_TABLET | Freq: Four times a day (QID) | ORAL | Status: DC | PRN

## 2017-10-30 MED ORDER — CLONIDINE HCL 0.1 MG PO TABS: 0.1000 mg | ORAL_TABLET | Freq: Every day | ORAL | Status: DC

## 2017-10-30 MED ORDER — METHOCARBAMOL 500 MG PO TABS
500.0000 mg | ORAL_TABLET | Freq: Three times a day (TID) | ORAL | Status: DC | PRN
  Administered 2017-10-31: 500 mg via ORAL
  Filled 2017-10-30: qty 1

## 2017-10-30 MED ORDER — HYDROXYZINE HCL 25 MG PO TABS: 25.0000 mg | ORAL_TABLET | Freq: Four times a day (QID) | ORAL | Status: DC | PRN

## 2017-10-30 MED ORDER — ONDANSETRON 4 MG PO TBDP
4.0000 mg | ORAL_TABLET | Freq: Four times a day (QID) | ORAL | Status: DC | PRN
  Administered 2017-10-31: 4 mg via ORAL
  Filled 2017-10-30: qty 1

## 2017-10-30 MED ORDER — LOPERAMIDE HCL 2 MG PO CAPS: 2.0000 mg | ORAL_CAPSULE | ORAL | Status: DC | PRN

## 2017-10-30 NOTE — ED Notes (Signed)
Pt has slept all day only to awaken long enough to yell out several times that it is cold.  Additional blanket was given to pt and she was asked to not yell out.  Pt is in no distress.  15 minute checks and video monitoring in place.

## 2017-10-30 NOTE — Patient Outreach (Signed)
CPSS tried to meet with the patient to provide substance use recovery support and help with recovery resources. Patient was unable to participate in the CPSS assessment due to the patient not being able to stay awake. Patient has not been able to participate in the TTS assessment as well for the same reason. CPSS will try to meet with the patient to provide help for her heroin use at a later time.

## 2017-10-30 NOTE — BH Assessment (Addendum)
Assessment Note  Christy Barnett is an 47 y.o. female that presents with IVC. Per IVC: "Respondent stated she wanted to die. When she asked if she wanted to hurt herself she stated yes. Respondent stated she would probably hang herself." TTS attempted to assess patient several times earlier today unsuccessfully due to patient being actively impaired and sleeping. This Clinical research associate assessed patient at 86 with patient being observed to be drowsy and angry. Patient renders limited information and refuses to answer some of the questions on the assessment. When asked in reference to S/I patient stated "you figure it out." Patient shouted "No, No" to multiple questions on the assessment. This Clinical research associate is unsure if patient is processing the content of this writer's questions. Patient does deny any H/I or AVH. Patient per chart review has pending legal charges (DUI) and is currently on probation. Patient tested positive this date for opiates and benzodiazepines although denies current use. Patient refuses to render any SA history. Patient has one previous attempt at self harm in 2012 when she attempted to overdose and was admitted to [facility]. Patient denies any current MH diagnoses or medication interventions. Patient terminates assessment by asking this writer to leave her alone. Patient is observed to be angry and shouts obscenities at this writer on exit. Information to complete assessment was obtained from notes and prior history. Per notes, patient has a history of heroin abuse presenting with IVC. She had apparently called a crisis hotline and told him that she was feeling suicidal. [employer] took out IVC paperwork on the patient. Last used heroin this morning and states that she typically snorts it. Denies homicidal ideation. Case was staffed with Arville Care FNP who recommended patient be observed and monitored. Patient will be seen by psychiatry in the a.m.   Diagnosis: F11.20 Opoid use disorder   Past Medical History:  Past  Medical History:  Diagnosis Date  . Asthma   . Bronchitis   . Hepatitis C   . Heroin abuse [facility])     Past Surgical History:  Procedure Laterality Date  . CESAREAN SECTION      Family History:  Family History  Problem Relation Age of Onset  . Heart disease Father   . Heart disease Paternal Grandfather   . COPD Mother     Social History:  reports that she has been smoking cigarettes. She has been smoking about 0.50 packs per day. She has never used smokeless tobacco. She reports that she does not drink alcohol or use drugs.  Additional Social History:  Alcohol / Drug Use Pain Medications: See MAR  Prescriptions: See MAR Over the Counter: See MAR History of alcohol / drug use?: Yes Longest period of sobriety (when/how long): UTA Negative Consequences of Use: (UTA) Withdrawal Symptoms: (UTA) Substance #1 Name of Substance 1: Alcohol 1 - Age of First Use: UTA 1 - Amount (size/oz): UTA 1 - Frequency: UTA 1 - Duration: UTA 1 - Last Use / Amount: UTA Substance #2 Name of Substance 2: Opiates 2 - Age of First Use: UTA 2 - Amount (size/oz): UTA 2 - Frequency: UTA 2 - Duration: UTA 2 - Last Use / Amount: UTA pt is positive for opiates this date  CIWA: CIWA-Ar BP: 97/78 Pulse Rate: 69 COWS:    Allergies: No Known Allergies  Home Medications:  (Not in a hospital admission)  OB/GYN Status:  No LMP recorded (lmp unknown).  General Assessment Data Assessment unable to be completed: Yes Reason for not completing assessment: Pt was  unable to be aroused.  LPC prompted pt multiple times pt begin to moan as if she was in pain.   Location of Assessment: [facility] ED TTS Assessment: In system Is this a Tele or Face-to-Face Assessment?: Face-to-Face Is this an Initial Assessment or a Re-assessment for this encounter?: Initial Assessment Patient Accompanied by:: (NA) Language Other than English: No Living Arrangements: (Alone) What gender do you identify as?: Female Marital  status: Single Maiden name: Croson Pregnancy Status: No Living Arrangements: Alone Can pt return to current living arrangement?: Yes Admission Status: Involuntary Petitioner: Police Is patient capable of signing voluntary admission?: Yes Referral Source: Other Insurance type: Self Pay  Medical Screening Exam [facility] Walk-in ONLY) Medical Exam completed: Yes  Crisis Care Plan Living Arrangements: Alone Legal Guardian: (NA) Name of Psychiatrist: None Name of Therapist: None  Education Status Is patient currently in school?: No Is the patient employed, unemployed or receiving disability?: Unemployed  Risk to self with the past 6 months Suicidal Ideation: Yes-Currently Present Has patient been a risk to self within the past 6 months prior to admission? : No Suicidal Intent: Yes-Currently Present Has patient had any suicidal intent within the past 6 months prior to admission? : No Is patient at risk for suicide?: Yes Suicidal Plan?: Yes-Currently Present Has patient had any suicidal plan within the past 6 months prior to admission? : No Specify Current Suicidal Plan: Sherri Rad herself per IVC Access to Means: Yes Specify Access to Suicidal Means: Hang herself What has been your use of drugs/alcohol within the last 12 months?: Current use Previous Attempts/Gestures: Yes How many times?: 1 Other Self Harm Risks: (Excessive SA use) Triggers for Past Attempts: Unknown Intentional Self Injurious Behavior: None Family Suicide History: No Recent stressful life event(s): Other (Comment)(Pending legal charges) Persecutory voices/beliefs?: No Depression: (Pt denies) Depression Symptoms: (UTA) Substance abuse history and/or treatment for substance abuse?: No Suicide prevention information given to non-admitted patients: Not applicable  Risk to Others within the past 6 months Homicidal Ideation: No Does patient have any lifetime risk of violence toward others beyond the six months prior  to admission? : No Thoughts of Harm to Others: No Current Homicidal Intent: No Current Homicidal Plan: No Access to Homicidal Means: No Identified Victim: NA History of harm to others?: No Assessment of Violence: None Noted Violent Behavior Description: NA Does patient have access to weapons?: No Criminal Charges Pending?: Yes Describe Pending Criminal Charges: DUI Does patient have a court date: Yes Court Date: (Pt states sometimes in November) Is patient on probation?: Yes  Psychosis Hallucinations: None noted Delusions: None noted  Mental Status Report Appearance/Hygiene: In scrubs Eye Contact: Unable to Assess Motor Activity: Freedom of movement Speech: Slow, Slurred Level of Consciousness: Drowsy Mood: Threatening Affect: Angry Anxiety Level: Minimal Thought Processes: Thought Blocking Judgement: Impaired Orientation: Unable to assess Obsessive Compulsive Thoughts/Behaviors: Unable to Assess  Cognitive Functioning Concentration: Unable to Assess Memory: Unable to Assess Is patient IDD: No Insight: Unable to Assess Impulse Control: Unable to Assess Appetite: (UTA) Have you had any weight changes? : (UTA) Sleep: (UTA) Total Hours of Sleep: (UTA) Vegetative Symptoms: (UTA)  ADLScreening [facility] Assessment Services) Patient's cognitive ability adequate to safely complete daily activities?: Yes Patient able to express need for assistance with ADLs?: Yes Independently performs ADLs?: Yes (appropriate for developmental age)  Prior Inpatient Therapy Prior Inpatient Therapy: Yes Prior Therapy Dates: 2007 Prior Therapy Facilty/Provider(s): [facility] Reason for Treatment: MH issues  Prior Outpatient Therapy Prior Outpatient Therapy: No Does patient have  an ACCT team?: No Does patient have Intensive In-House Services?  : No Does patient have [facility] services? : No Does patient have P4CC services?: No  ADL Screening (condition at time of admission) Patient's cognitive  ability adequate to safely complete daily activities?: Yes Is the patient deaf or have difficulty hearing?: No Does the patient have difficulty seeing, even when wearing glasses/contacts?: No Does the patient have difficulty concentrating, remembering, or making decisions?: No Patient able to express need for assistance with ADLs?: Yes Does the patient have difficulty dressing or bathing?: No Independently performs ADLs?: Yes (appropriate for developmental age) Does the patient have difficulty walking or climbing stairs?: No Weakness of Legs: None Weakness of Arms/Hands: None  Home Assistive Devices/Equipment Home Assistive Devices/Equipment: None  Therapy Consults (therapy consults require a physician order) PT Evaluation Needed: No OT Evalulation Needed: No SLP Evaluation Needed: No Abuse/Neglect Assessment (Assessment to be complete while patient is alone) Physical Abuse: Denies Verbal Abuse: Denies Sexual Abuse: Denies Exploitation of patient/patient's resources: Denies Self-Neglect: Denies Values / Beliefs Cultural Requests During Hospitalization: None Spiritual Requests During Hospitalization: None Consults Spiritual Care Consult Needed: No Social Work Consult Needed: No Merchant navy officer (For Healthcare) Does Patient Have a Medical Advance Directive?: No Would patient like information on creating a medical advance directive?: No - Patient declined          Disposition: Case was staffed with Arville Care FNP who recommended patient be observed and monitored. Patient will be seen by psychiatry in the a.m.    Disposition Initial Assessment Completed for this Encounter: Yes Disposition of Patient: (Observe and monitor) Patient refused recommended treatment: No Other disposition(s): (Observe and monitor) Mode of transportation if patient is discharged?: Loreli Slot)  On Site Evaluation by:   Reviewed with Physician:    Alfredia Ferguson 10/30/2017 6:05 PM

## 2017-10-30 NOTE — BH Assessment (Signed)
[facility] Assessment Progress Note 10/30/17: 0900 hours Patient continues to be impaired/sedated and is unable to be assessed. Patient will be seen later this date.

## 2017-10-30 NOTE — ED Notes (Signed)
Pt vitals signs taken, pt resting at this time.

## 2017-10-30 NOTE — BH Assessment (Signed)
  BH ASSESSMENT  LPC attempted to complete BH Assessment with pt but pt was unable to be aroused.  Pt was prompted multiple times but begin to moan as if she was in pain.  TTS will complete assessment at a later time.  Adaysha Dubinsky L. Dian Laprade, MS, LPC, [facility] Therapeutic Triage Specialist  [phone]

## 2017-10-30 NOTE — BH Assessment (Signed)
[facility] Assessment Progress Note Case was staffed with Parks FNP who recommended patient be observed and monitored. Patient will be seen by psychiatry in the a.m.    

## 2017-10-30 NOTE — ED Notes (Signed)
Patient refused vitals  Rn Latricia made aware 

## 2017-10-31 DIAGNOSIS — F1114 Opioid abuse with opioid-induced mood disorder: Secondary | ICD-10-CM

## 2017-10-31 NOTE — [facility]HH Suicide Risk Assessment (Cosign Needed)
Suicide Risk Assessment  Discharge Assessment   [facility] Discharge Suicide Risk Assessment   Principal Problem: Opioid abuse with opioid-induced mood disorder [facility]) Discharge Diagnoses:  Patient Active Problem List   Diagnosis Date Noted  . Opioid abuse with opioid-induced mood disorder (HCC) [F11.14] 10/30/2017  . Bilateral lower extremity edema [R60.0] 07/16/2017  . Heart murmur [R01.1] 07/16/2017  . Bipolar affective disorder in remission (HCC) [F31.70] 07/16/2017  . PTSD (post-traumatic stress disorder) [F43.10] 07/16/2017  . History of substance abuse (HCC) [F19.11] 07/16/2017  . Family history of DVT [Z82.49] 07/16/2017  . Anemia [D64.9] 07/16/2017    Total Time spent with patient: 30 minutes  Musculoskeletal: Strength & Muscle Tone: within normal limits Gait & Station: normal Patient leans: N/A  Psychiatric Specialty Exam:   Blood pressure 129/78, pulse 83, temperature 98.9 F (37.2 C), temperature source Oral, resp. rate 16, SpO2 98 %.There is no height or weight on file to calculate BMI.  General Appearance: Disheveled  Eye Contact::  Good  Speech:  Clear and Coherent409  Volume:  Normal  Mood:  Anxious  Affect:  Congruent  Thought Process:  Coherent, Goal Directed and Linear  Orientation:  Full (Time, Place, and Person)  Thought Content:  Logical  Suicidal Thoughts:  No  Homicidal Thoughts:  No  Memory:  Immediate;   Good Recent;   Fair Remote;   Fair  Judgement:  Good  Insight:  Shallow  Psychomotor Activity:  Decreased  Concentration:  Fair  Recall:  [employer] of Knowledge:Fair  Language: Good  Akathisia:  No  Handed:  Right  AIMS (if indicated):     Assets:  Architect Housing Social Support  Sleep:     Cognition: WNL  ADL's:  Intact   Mental Status Per Nursing Assessment::   On Admission:   Pt was admitted after using 1 gram of heroin on 10-29-2017. She has been observed in the emergency room and has been  calm and cooperative. Pt was seen by Peer Support and provided resources and assistance for substance abuse treatment in the community. Pt is psychiatrically clear for discharge.   Demographic Factors:  Living alone and Unemployed  Loss Factors: Legal issues and Financial problems/change in socioeconomic status  Historical Factors: Family history of mental illness or substance abuse  Risk Reduction Factors:   Sense of responsibility to family  Continued Clinical Symptoms:  Alcohol/Substance Abuse/Dependencies  Cognitive Features That Contribute To Risk:  Closed-mindedness    Suicide Risk:  Minimal: No identifiable suicidal ideation.  Patients presenting with no risk factors but with morbid ruminations; may be classified as minimal risk based on the severity of the depressive symptoms    Plan Of Care/Follow-up recommendations:  Activity:  as tolerated Diet:  Heart healthy  Laveda Abbe, NP 10/31/2017, 2:32 PM

## 2017-10-31 NOTE — Progress Notes (Addendum)
Patient ID: Christy Barnett, female   DOB: Sep 13, 1970, 47 y.o.   MRN: [id number]  Patient was seen by Peer Support and has been accepted to [facility]. She will be picked up today at 3:00 PM for transport to [facility] for drug treatment.   Laveda Abbe, NP-C 10-31-2017     [phone]

## 2017-10-31 NOTE — Discharge Instructions (Signed)
For your substance abuse needs, please follow up at:  [facility] [address] [place], [place] [address] [phone]

## 2017-10-31 NOTE — ED Notes (Signed)
Pt discharged safely with [employer] employee.  Pt was in no distress.  All belongings were sent with patient.

## 2017-10-31 NOTE — BH Assessment (Signed)
[facility] Assessment Progress Note    Per Elta Guadeloupe, NP and Dr Jannifer Franklin, patient can be discharged to follow-up with outpatient resources for substance abuse issues.

## 2017-10-31 NOTE — Patient Outreach (Signed)
ED Peer Support Specialist Patient Intake (Complete at intake & 30-60 Day Follow-up)  Name: Christy Barnett  MRN: [id number]  Age: 47 y.o.   Date of Admission: 10/31/2017  Intake: Initial Comments:      Primary Reason Admitted: heroin use, SI  Lab values: Alcohol/ETOH: Positive Positive UDS? No Amphetamines: No Barbiturates: No Benzodiazepines: Yes Cocaine: No Opiates: Yes Cannabinoids: No  Demographic information: Gender: Female Ethnicity: White Marital Status: Single Insurance Status: Other (comment)(Med Pay) Ecologist (Work Neurosurgeon, Physicist, medical, etc.: No Lives with: Alone Living situation: House/Apartment  Reported Patient History: Patient reported health conditions: Trauma(PTSD) Patient aware of HIV and hepatitis status: Yes (comment)(Hepatitis C)  In past year, has patient visited ED for any reason? Yes  Number of ED visits: 5  Reason(s) for visit: concussion, cellulitis, edema, foot pain, leg pain  In past year, has patient been hospitalized for any reason? No  Number of hospitalizations:    Reason(s) for hospitalization:    In past year, has patient been arrested? No  Number of arrests:    Reason(s) for arrest:    In past year, has patient been incarcerated? No  Number of incarcerations:    Reason(s) for incarceration:    In past year, has patient received medication-assisted treatment? No  In past year, patient received the following treatments:    In past year, has patient received any harm reduction services? No  Did this include any of the following?    In past year, has patient received care from a mental health provider for diagnosis other than SUD? No  In past year, is this first time patient has overdosed? No  Number of past overdoses: 4  In past year, is this first time patient has been hospitalized for an overdose? No  Number of hospitalizations for overdose(s):    Is patient currently receiving  treatment for a mental health diagnosis? No  Patient reports experiencing difficulty participating in SUD treatment: No    Most important reason(s) for this difficulty?    Has patient received prior services for treatment? No  In past, patient has received services from following agencies:    Plan of Care:  Suggested follow up at these agencies/treatment centers: Other (comment), [facility])(Patient is looking for detox for her heroin use. CPSS will help with referrals and information for those resources)  Other information: CPSS met with the patient to provide substance use recovery support and help with recovery resources. CPSS will provide information and help with referrals to detox resources including [facility] and [facility]. CPSS will also provide information for [facility] to address case management needs. CPSS will also provide CPSS contact information.    Mason Jim, CPSS  10/31/2017 11:58 AM

## 2017-10-31 NOTE — Consult Note (Signed)
[place] [facility] Consult   Reason for Consult: suicidal thoughts Referring Physician:  EDP Patient Identification: Christy Barnett MRN:  [id number] Principal Diagnosis: Opioid abuse with opioid-induced mood disorder [facility]) Diagnosis:   Patient Active Problem List   Diagnosis Date Noted  . Opioid abuse with opioid-induced mood disorder ([place]) [F11.14] 10/30/2017  . Bilateral lower extremity edema [R60.0] 07/16/2017  . Heart murmur [R01.1] 07/16/2017  . Bipolar affective disorder in remission ([place]) [F31.70] 07/16/2017  . PTSD (post-traumatic stress disorder) [F43.10] 07/16/2017  . History of substance abuse ([place]) [F19.11] 07/16/2017  . Family history of DVT [Z82.49] 07/16/2017  . Anemia [D64.9] 07/16/2017    Total Time spent with patient: 45 minutes  Subjective:   Christy Barnett is a 47 y.o. female patient admitted due to suicidal thoughts.  HPI: Patient with history of hepatitis C, heroin abuse, PTSD who was brought to [facility] under IVC after she called hotline and told them she was feeling suicidal 2 days ago.Patient reports that she was getting ''high'' on Heroin and suddenly felt depressed and suicidal. Today, she is calm, cooperative, alert, oriented and denies SI/HI, psychosis or delusional thinking. Patient reports long history of addiction to Heroin with multiple rehabilitations in the past. She is requesting for referral to rehab for Heroin.  Past Psychiatric History: as above  Risk to Self: Suicidal Ideation:  Not Currently Present Suicidal Intent: Not Currently Present Is patient at risk for suicide?: denies Suicidal Plan?: denies-Currently Present Specify Current Suicidal Plan:denies Access to Means: denies Specify Access to Suicidal Means: denies What has been your use of drugs/alcohol within the last 12 months?: Current use How many times?: 1 Other Self Harm Risks: (Excessive SA use) Triggers for Past Attempts: Unknown Intentional Self Injurious Behavior:  None Risk to Others: Homicidal Ideation: No Thoughts of Harm to Others: No Current Homicidal Intent: No Current Homicidal Plan: No Access to Homicidal Means: No Identified Victim: NA History of harm to others?: No Assessment of Violence: None Noted Violent Behavior Description: NA Does patient have access to weapons?: No Criminal Charges Pending?: Yes Describe Pending Criminal Charges: DUI Does patient have a court date: Yes Court Date: (Pt states sometimes in November) Prior Inpatient Therapy: Prior Inpatient Therapy: Yes Prior Therapy Dates: 2007 Prior Therapy Facilty/Provider(s): [facility] Reason for Treatment: MH issues Prior Outpatient Therapy: Prior Outpatient Therapy: No Does patient have an ACCT team?: No Does patient have Intensive In-House Services?  : No Does patient have [facility] services? : No Does patient have P4CC services?: No  Past Medical History:  Past Medical History:  Diagnosis Date  . Asthma   . Bronchitis   . Hepatitis C   . Heroin abuse [facility])     Past Surgical History:  Procedure Laterality Date  . CESAREAN SECTION     Family History:  Family History  Problem Relation Age of Onset  . Heart disease Father   . Heart disease Paternal Grandfather   . COPD Mother    Family Psychiatric  History:  Social History:  Social History   Substance and Sexual Activity  Alcohol Use No     Social History   Substance and Sexual Activity  Drug Use No    Social History   Socioeconomic History  . Marital status: Widowed    Spouse name: Not on file  . Number of children: Not on file  . Years of education: Not on file  . Highest education level: Not on file  Occupational History  . Not on file  Social Needs  . Financial resource strain: Not on file  . Food insecurity:    Worry: Not on file    Inability: Not on file  . Transportation needs:    Medical: Not on file    Non-medical: Not on file  Tobacco Use  . Smoking status: Current Every Day Smoker     Packs/day: 0.50    Types: Cigarettes  . Smokeless tobacco: Never Used  Substance and Sexual Activity  . Alcohol use: No  . Drug use: No  . Sexual activity: Not on file  Lifestyle  . Physical activity:    Days per week: Not on file    Minutes per session: Not on file  . Stress: Not on file  Relationships  . Social connections:    Talks on phone: Not on file    Gets together: Not on file    Attends religious service: Not on file    Active member of club or organization: Not on file    Attends meetings of clubs or organizations: Not on file    Relationship status: Not on file  Other Topics Concern  . Not on file  Social History Narrative   ** Merged History Encounter **       Additional Social History:    Allergies:  No Known Allergies  Labs:  Results for orders placed or performed during the hospital encounter of 10/29/17 (from the past 48 hour(s))  Rapid urine drug screen (hospital performed)     Status: Abnormal   Collection Time: 10/29/17  7:25 PM  Result Value Ref Range   Opiates POSITIVE (A) NONE DETECTED   Cocaine NONE DETECTED NONE DETECTED   Benzodiazepines POSITIVE (A) NONE DETECTED   Amphetamines NONE DETECTED NONE DETECTED   Tetrahydrocannabinol NONE DETECTED NONE DETECTED   Barbiturates NONE DETECTED NONE DETECTED    Comment: (NOTE) DRUG SCREEN FOR MEDICAL PURPOSES ONLY.  IF CONFIRMATION IS NEEDED FOR ANY PURPOSE, NOTIFY LAB WITHIN 5 DAYS. LOWEST DETECTABLE LIMITS FOR URINE DRUG SCREEN Drug Class                     Cutoff (ng/mL) Amphetamine and metabolites    1000 Barbiturate and metabolites    200 Benzodiazepine                 993 Tricyclics and metabolites     300 Opiates and metabolites        300 Cocaine and metabolites        300 THC                            50 Performed at [facility], [place] [address], [place], [place] [address]   Comprehensive metabolic panel     Status: None   Collection Time: 10/29/17  7:47 PM   Result Value Ref Range   Sodium 143 135 - 145 mmol/L   Potassium 3.9 3.5 - 5.1 mmol/L   Chloride 107 98 - 111 mmol/L   CO2 27 22 - 32 mmol/L   Glucose, Bld 94 70 - 99 mg/dL   BUN 10 6 - 20 mg/dL   Creatinine, Ser 0.64 0.44 - 1.00 mg/dL   Calcium 9.2 8.9 - 10.3 mg/dL   Total Protein 6.8 6.5 - 8.1 g/dL   Albumin 3.6 3.5 - 5.0 g/dL   AST 24 15 - 41 U/L   ALT 15 0 - 44 U/L  Alkaline Phosphatase 56 38 - 126 U/L   Total Bilirubin 0.4 0.3 - 1.2 mg/dL   GFR calc non Af Amer >60 >60 mL/min   GFR calc Af Amer >60 >60 mL/min    Comment: (NOTE) The eGFR has been calculated using the CKD EPI equation. This calculation has not been validated in all clinical situations. eGFR's persistently <60 mL/min signify possible Chronic Kidney Disease.    Anion gap 9 5 - 15    Comment: Performed at [facility], [place] [address], [place], [place] [address]  Ethanol     Status: None   Collection Time: 10/29/17  7:47 PM  Result Value Ref Range   Alcohol, Ethyl (B) <10 <10 mg/dL    Comment: (NOTE) Lowest detectable limit for serum alcohol is 10 mg/dL. For medical purposes only. Performed at [facility], [place] [address], [place], [place] [address]   Salicylate level     Status: None   Collection Time: 10/29/17  7:47 PM  Result Value Ref Range   Salicylate Lvl <3.5 2.8 - 30.0 mg/dL    Comment: Performed at [facility], [place] [address] [place], [place] [address]  Acetaminophen level     Status: Abnormal   Collection Time: 10/29/17  7:47 PM  Result Value Ref Range   Acetaminophen (Tylenol), Serum <10 (L) 10 - 30 ug/mL    Comment: (NOTE) Therapeutic concentrations vary significantly. A range of 10-30 ug/mL  may be an effective concentration for many patients. However, some  are best treated at concentrations outside of this range. Acetaminophen concentrations >150 ug/mL at 4 hours after ingestion  and >50 ug/mL at 12 hours after ingestion are  often associated with  toxic reactions. Performed at [facility], [place] [address] [place], [place] [address]   cbc     Status: None   Collection Time: 10/29/17  7:47 PM  Result Value Ref Range   WBC 5.1 4.0 - 10.5 K/uL   RBC 4.09 3.87 - 5.11 MIL/uL   Hemoglobin 12.0 12.0 - 15.0 g/dL   HCT 37.2 36.0 - 46.0 %   MCV 91.0 80.0 - 100.0 fL   MCH 29.3 26.0 - 34.0 pg   MCHC 32.3 30.0 - 36.0 g/dL   RDW 12.3 11.5 - 15.5 %   Platelets 299 150 - 400 K/uL   nRBC 0.0 0.0 - 0.2 %    Comment: Performed at [facility], [place] [address] [place], [place] [address]  I-Stat beta hCG blood, ED     Status: None   Collection Time: 10/29/17  7:56 PM  Result Value Ref Range   I-stat hCG, quantitative <5.0 <5 mIU/mL   Comment 3            Comment:   GEST. AGE      CONC.  (mIU/mL)   <=1 WEEK        5 - 50     2 WEEKS       50 - 500     3 WEEKS       100 - 10,000     4 WEEKS     1,000 - 30,000        FEMALE AND NON-PREGNANT FEMALE:     LESS THAN 5 mIU/mL     Current Facility-Administered Medications  Medication Dose Route Frequency Provider Last Rate Last Dose  . cloNIDine (CATAPRES) tablet 0.1 mg  0.1 mg Oral QID Corena Pilgrim, MD  0.1 mg at 10/31/17 1121   Followed by  . [START ON 11/01/2017] cloNIDine (CATAPRES) tablet 0.1 mg  0.1 mg Oral BH-qamhs Keyara Ent, MD       Followed by  . [START ON 11/03/2017] cloNIDine (CATAPRES) tablet 0.1 mg  0.1 mg Oral QAC breakfast Branch Pacitti, MD      . dicyclomine (BENTYL) tablet 20 mg  20 mg Oral Q6H PRN Kilani Joffe, MD      . hydrOXYzine (ATARAX/VISTARIL) tablet 25 mg  25 mg Oral Q6H PRN Glory Graefe, MD      . loperamide (IMODIUM) capsule 2-4 mg  2-4 mg Oral PRN Suhan Paci, MD      . lurasidone (LATUDA) tablet 40 mg  40 mg Oral Q breakfast Cameka Rae, MD   40 mg at 10/31/17 0827  . methocarbamol (ROBAXIN) tablet 500 mg  500 mg Oral Q8H PRN Melinna Linarez, MD   500 mg at 10/31/17 1120   . naproxen (NAPROSYN) tablet 500 mg  500 mg Oral BID PRN Corena Pilgrim, MD   500 mg at 10/31/17 1120  . ondansetron (ZOFRAN-ODT) disintegrating tablet 4 mg  4 mg Oral Q6H PRN Corena Pilgrim, MD   4 mg at 10/31/17 0827   Current Outpatient Medications  Medication Sig Dispense Refill  . albuterol (PROVENTIL HFA;VENTOLIN HFA) 108 (90 Base) MCG/ACT inhaler Inhale 2 puffs into the lungs every 6 (six) hours as needed for wheezing or shortness of breath. 1 Inhaler 2  . cephALEXin (KEFLEX) 500 MG capsule Take 1 capsule (500 mg total) by mouth 2 (two) times daily. 14 capsule 0  . furosemide (LASIX) 40 MG tablet Take 1 tablet (40 mg total) by mouth daily. 30 tablet 2  . Lurasidone HCl (LATUDA PO) Take by mouth.    . naproxen (NAPROSYN) 500 MG tablet Take 1 tablet (500 mg total) by mouth 2 (two) times daily. 30 tablet 0  . potassium chloride SA (K-DUR,KLOR-CON) 20 MEQ tablet Take 1 tablet (20 mEq total) by mouth 2 (two) times daily as needed (with lasix). 30 tablet 0    Musculoskeletal: Strength & Muscle Tone: within normal limits Gait & Station: normal Patient leans: N/A  Psychiatric Specialty Exam: Physical Exam  Psychiatric: She has a normal mood and affect. Her speech is normal. Judgment and thought content normal. She is slowed. Cognition and memory are normal.    Review of Systems  Constitutional: Negative.   HENT: Negative.   Eyes: Negative.   Respiratory: Negative.   Cardiovascular: Negative.   Gastrointestinal: Negative.   Genitourinary: Negative.   Musculoskeletal: Negative.   Skin: Negative.   Neurological: Negative.   Endo/Heme/Allergies: Negative.   Psychiatric/Behavioral: Positive for substance abuse.    Blood pressure 129/78, pulse 83, temperature 98.9 F (37.2 C), temperature source Oral, resp. rate 16, SpO2 98 %.There is no height or weight on file to calculate BMI.  General Appearance: Casual  Eye Contact:  Good  Speech:  Clear and Coherent  Volume:  Normal   Mood:  Euphoric  Affect:  Congruent  Thought Process:  Coherent and Linear  Orientation:  Full (Time, Place, and Person)  Thought Content:  Logical  Suicidal Thoughts:  No  Homicidal Thoughts:  No  Memory:  Immediate;   Fair Recent;   Good Remote;   Good  Judgement:  Fair  Insight:  Fair  Psychomotor Activity:  Psychomotor Retardation  Concentration:  Concentration: Fair and Attention Span: Fair  Recall:  [employer] of Knowledge:  Fair  Language:  Good  Akathisia:  No  Handed:  Right  AIMS (if indicated):     Assets:  Communication Skills Desire for Improvement  ADL's:  Intact  Cognition:  WNL  Sleep:   fair     Treatment Plan Summary: Medication management: Continue Latuda 40 mg daily Refer to peer support  Disposition: No evidence of imminent risk to self or others at present.   Patient does not meet criteria for psychiatric inpatient admission. Supportive therapy provided about ongoing stressors. Refer to [facility] for Opioid rehabilitation  Corena Pilgrim, MD 10/31/2017 12:02 PM

## 2017-10-31 NOTE — Patient Outreach (Signed)
Patient was accepted into [facility]'s detox program for her heroin use. An [facility] staff member will be picking the patient up at 3:30pm from the [facility] to take her to [facility] treatment facility. [facility] will provide the patient with [facility] contact information. [facility] will strongly encourage the patient to stay in contact with [facility] during and after discharge from [facility]'s substance use treatment facility for substance use recovery support.
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
## 2017-11-02 ENCOUNTER — Telehealth: Payer: Self-pay

## 2017-11-02 NOTE — Telephone Encounter (Signed)
Called and spoke with patient, advised that she has an appointment tomorrow 11/03/2017 @ 10am and she sates she will keep this appointment. Thanks!

## 2017-11-03 ENCOUNTER — Ambulatory Visit: Payer: Self-pay | Admitting: Family Medicine

## 2017-12-28 IMAGING — DX DG CHEST 2V
2 series · 2 of 2 positions shown · non-contrast
Comparison: None.

CLINICAL DATA: Chest pain.

EXAM:
CHEST  2 VIEW

[w chest pa]
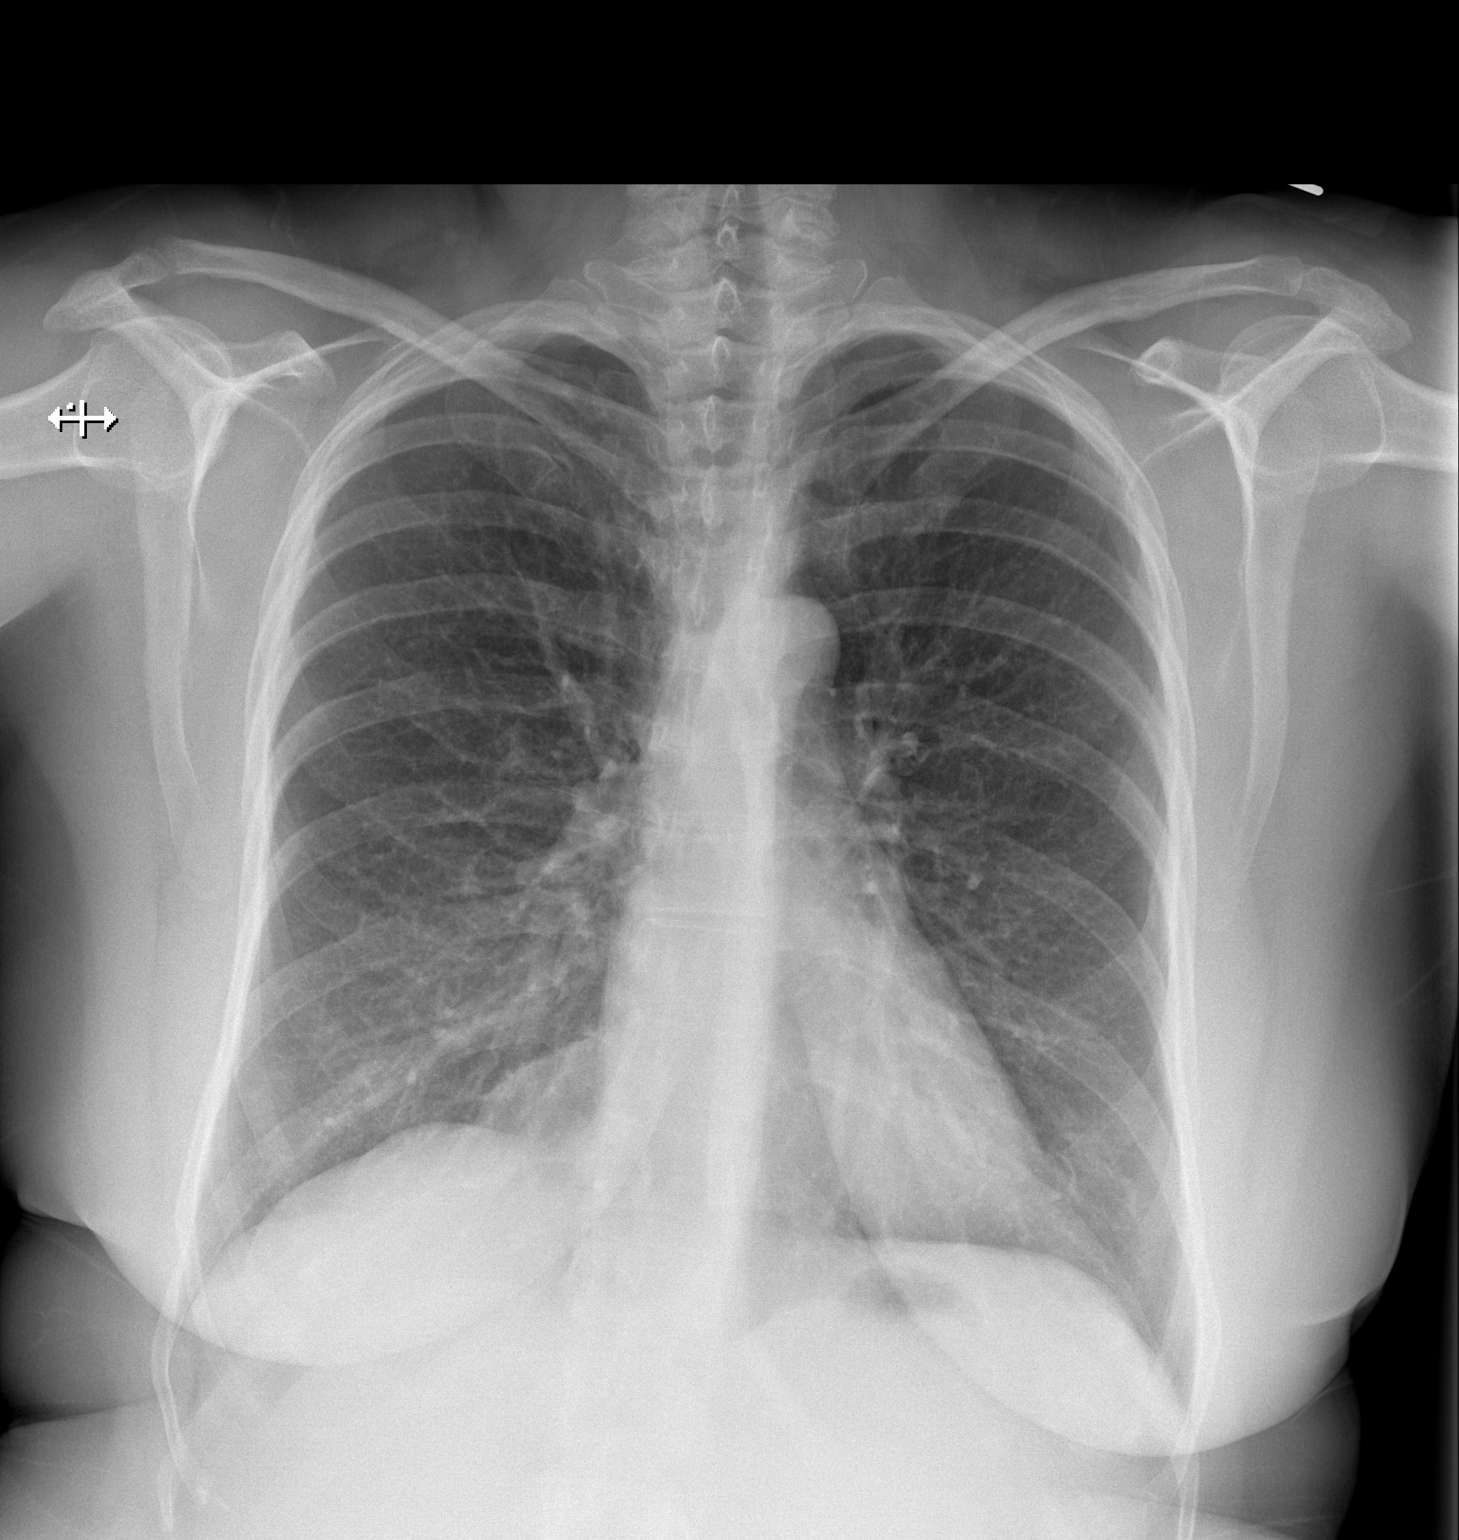

[w chest lat]
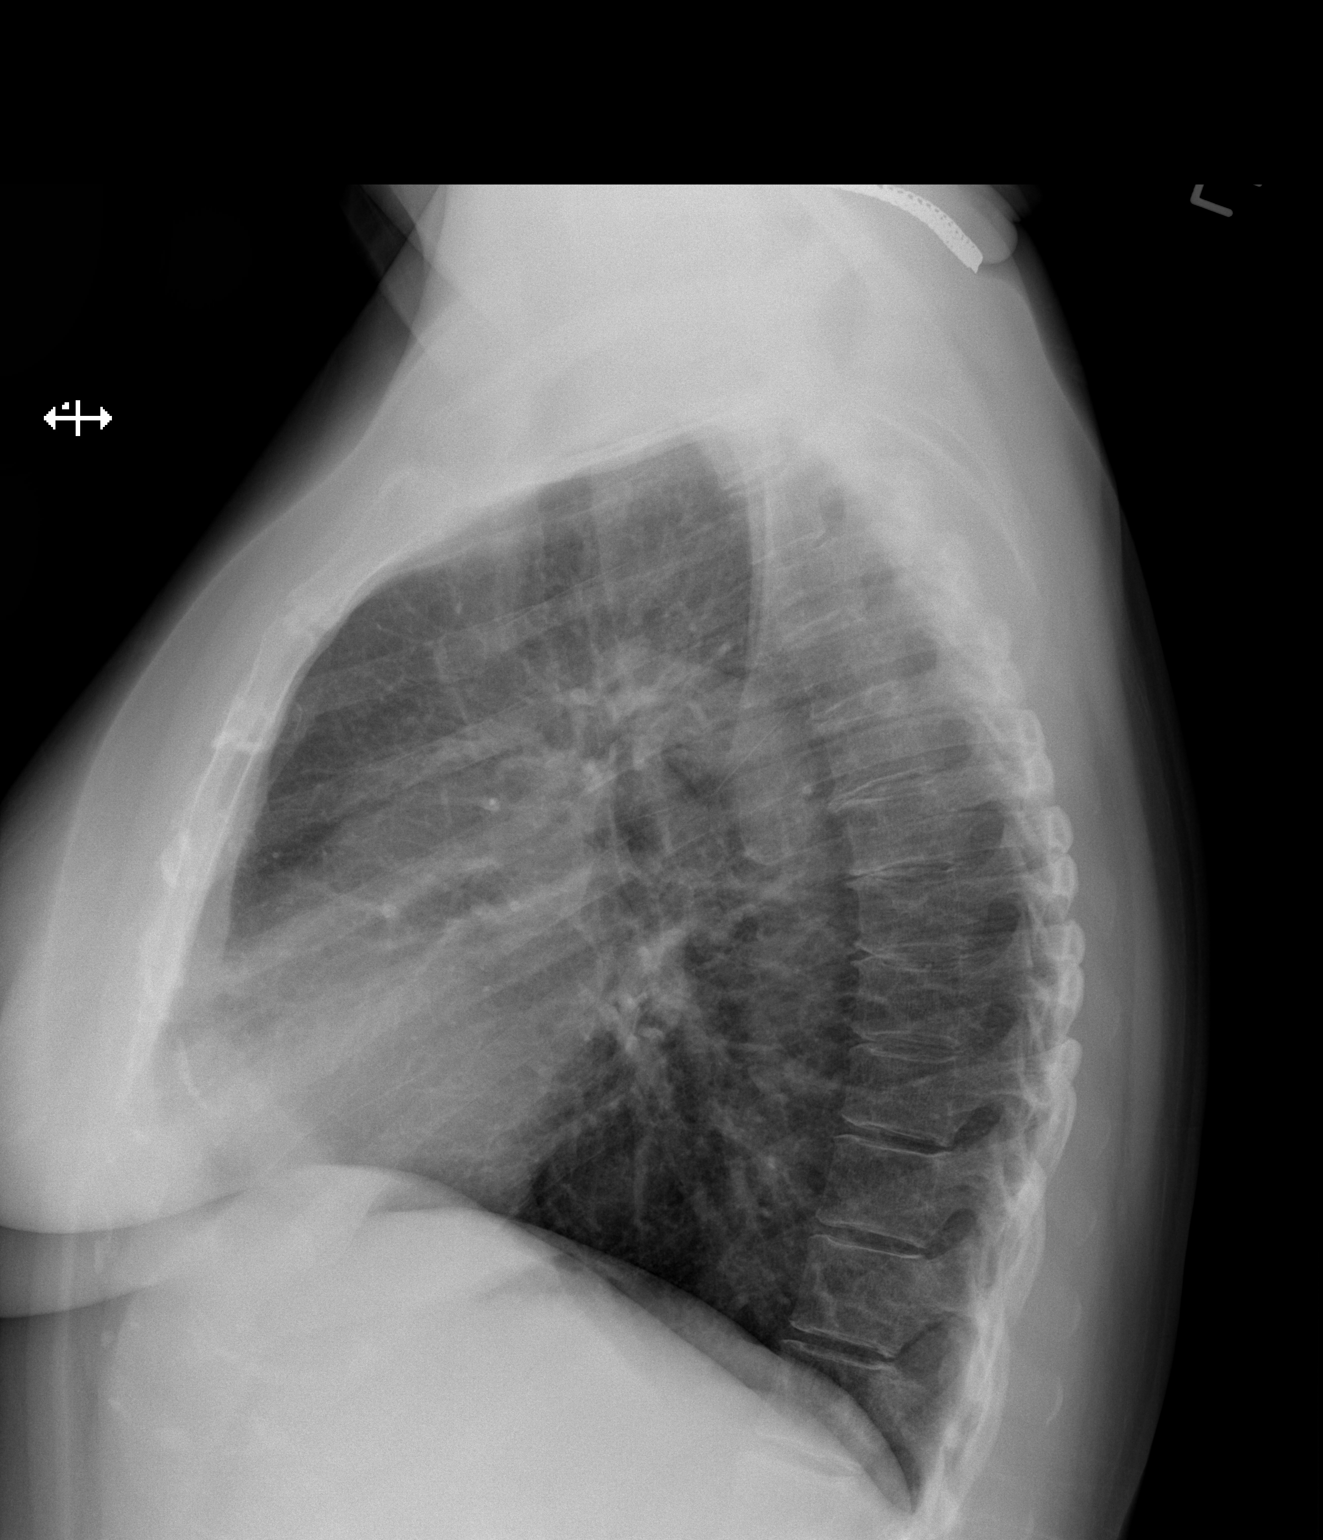

[2 of 2 positions shown; findings below may reference images not displayed]

FINDINGS: Heart and mediastinal contours are within normal limits. No focal
opacities or effusions. No acute bony abnormality.
IMPRESSION: No active cardiopulmonary disease.

## 2018-02-08 IMAGING — CR DG FOOT COMPLETE 3+V*L*
3 series · 3 of 3 positions shown · non-contrast
Comparison: Radiograph 5 days prior 12/13/2016

CLINICAL DATA: Left foot redness and swelling.

EXAM:
LEFT FOOT - COMPLETE 3+ VIEW

[foot ap]
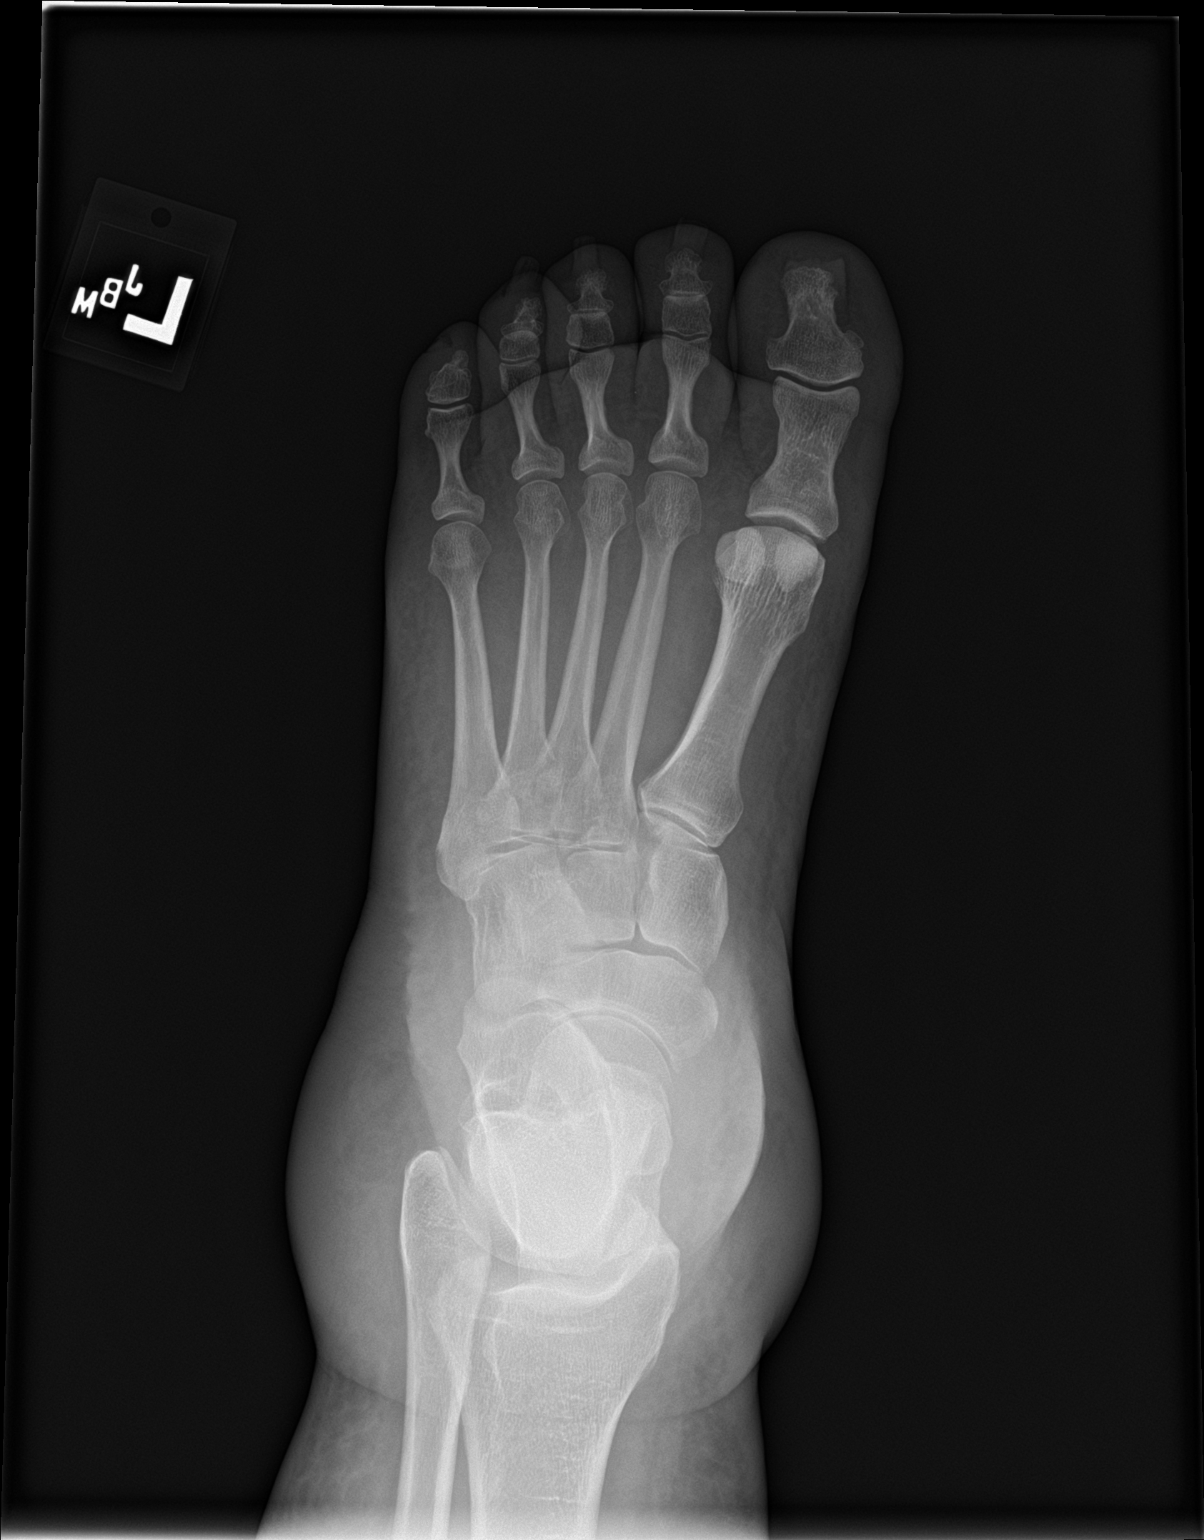

[foot obl]
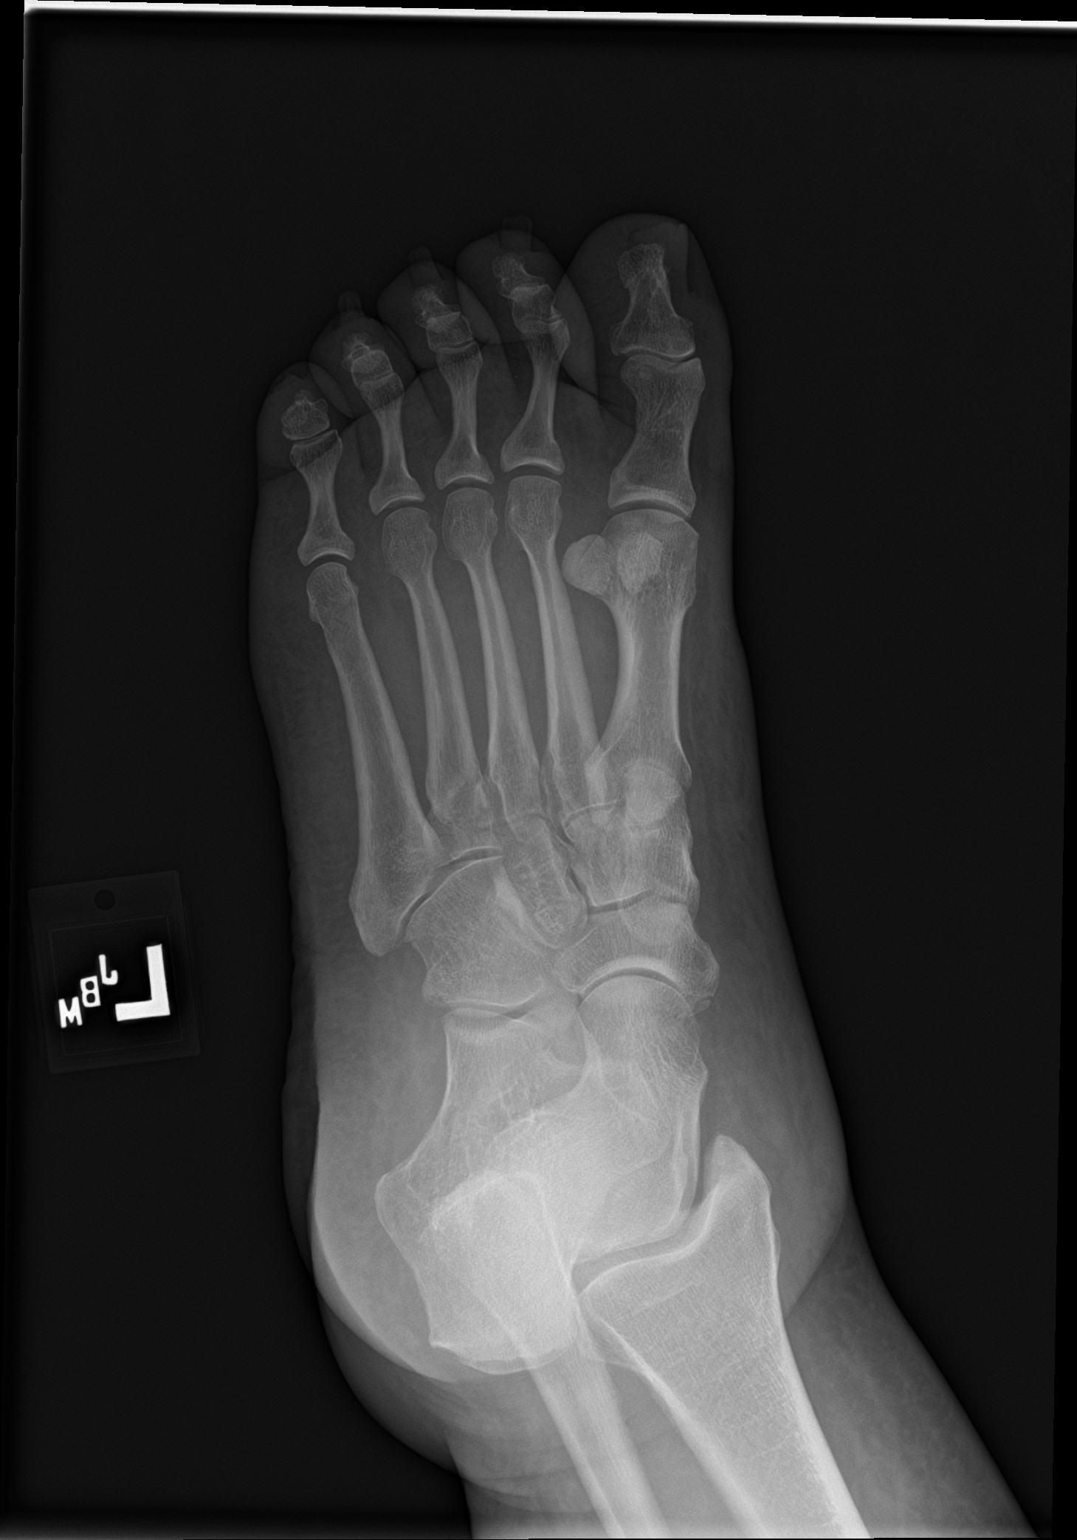

[foot lat]
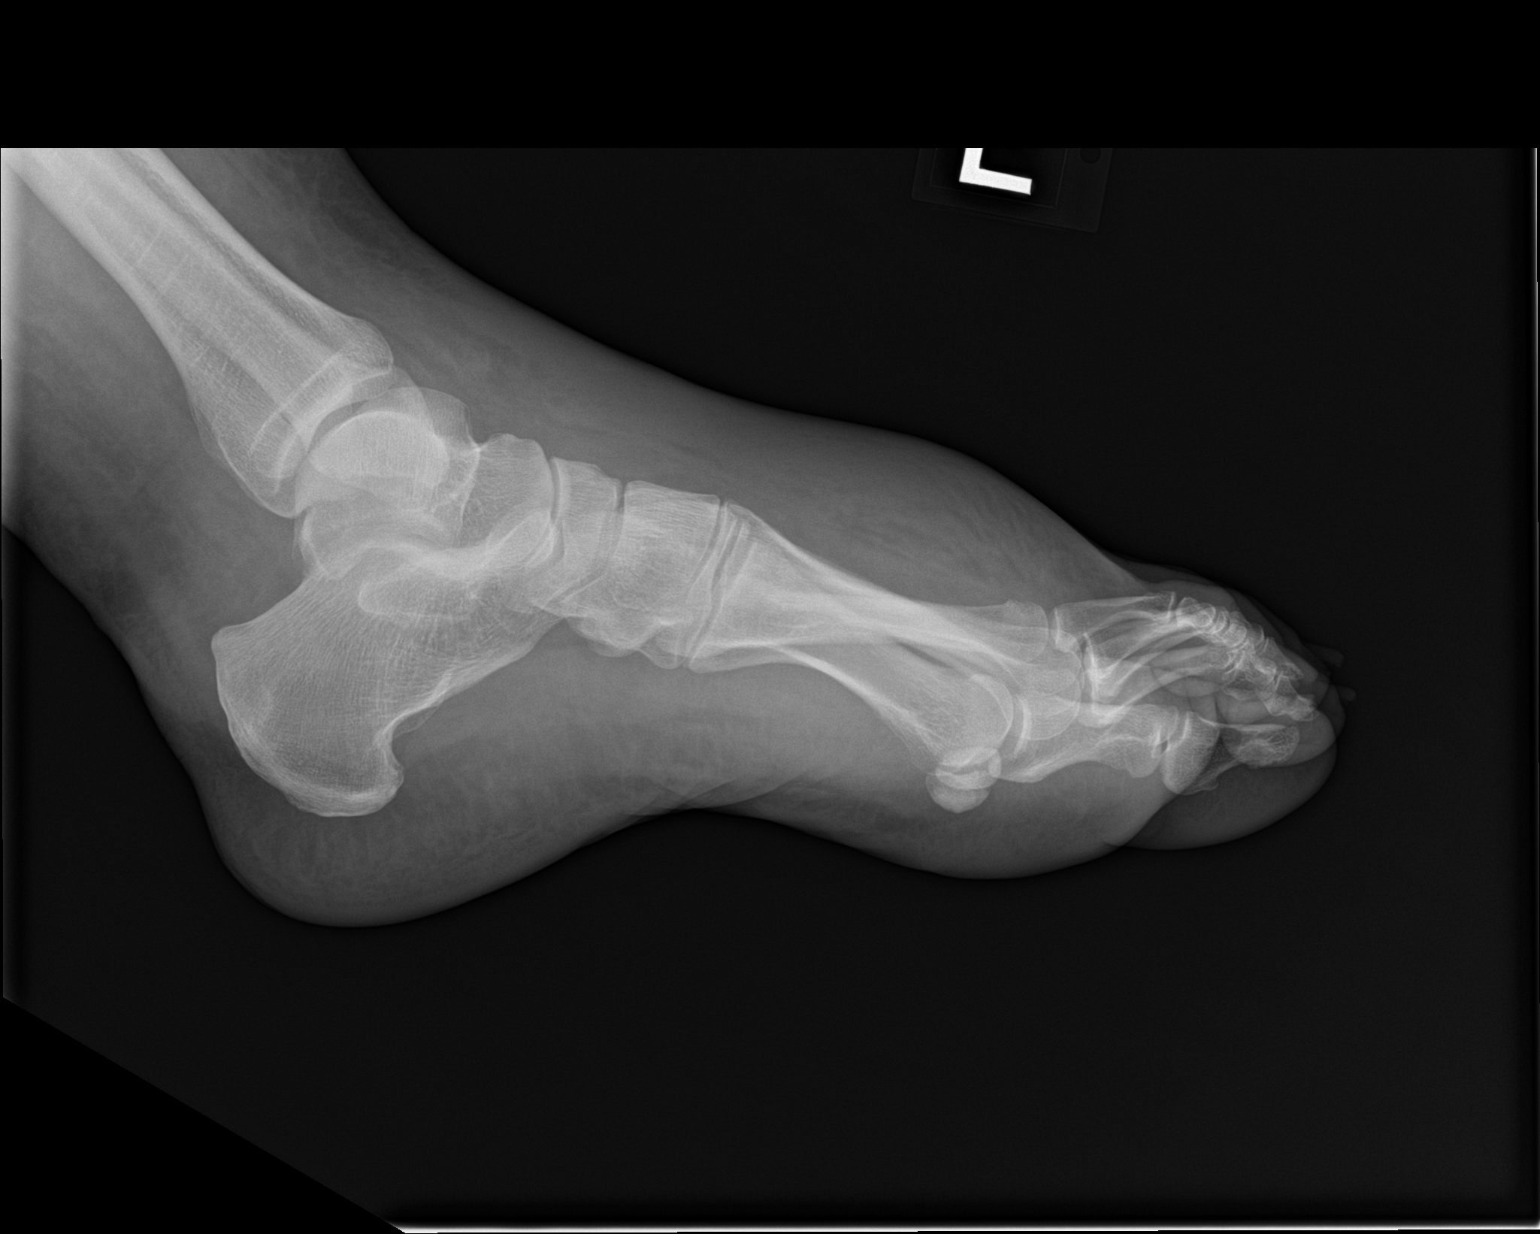

[3 of 3 positions shown; findings below may reference images not displayed]

FINDINGS: There is no evidence of fracture or dislocation. Small os navicular
is incidental. There is no evidence of arthropathy or other focal
bone abnormality. Marked dorsal soft tissue edema, progressed from
prior exam. Increased soft tissue edema about the ankle, partially
included. No soft tissue air or radiopaque foreign body.
IMPRESSION: Increased soft tissue edema over the last 5 days. No osseous
abnormality.

## 2018-02-08 IMAGING — CR DG CHEST 2V
2 series · 2 of 2 positions shown · non-contrast
Comparison: Chest radiograph and abdominal CT 11/23/2016

CLINICAL DATA: Shortness of breath.  Left-sided chest pain.

EXAM:
CHEST  2 VIEW

[chest lat]
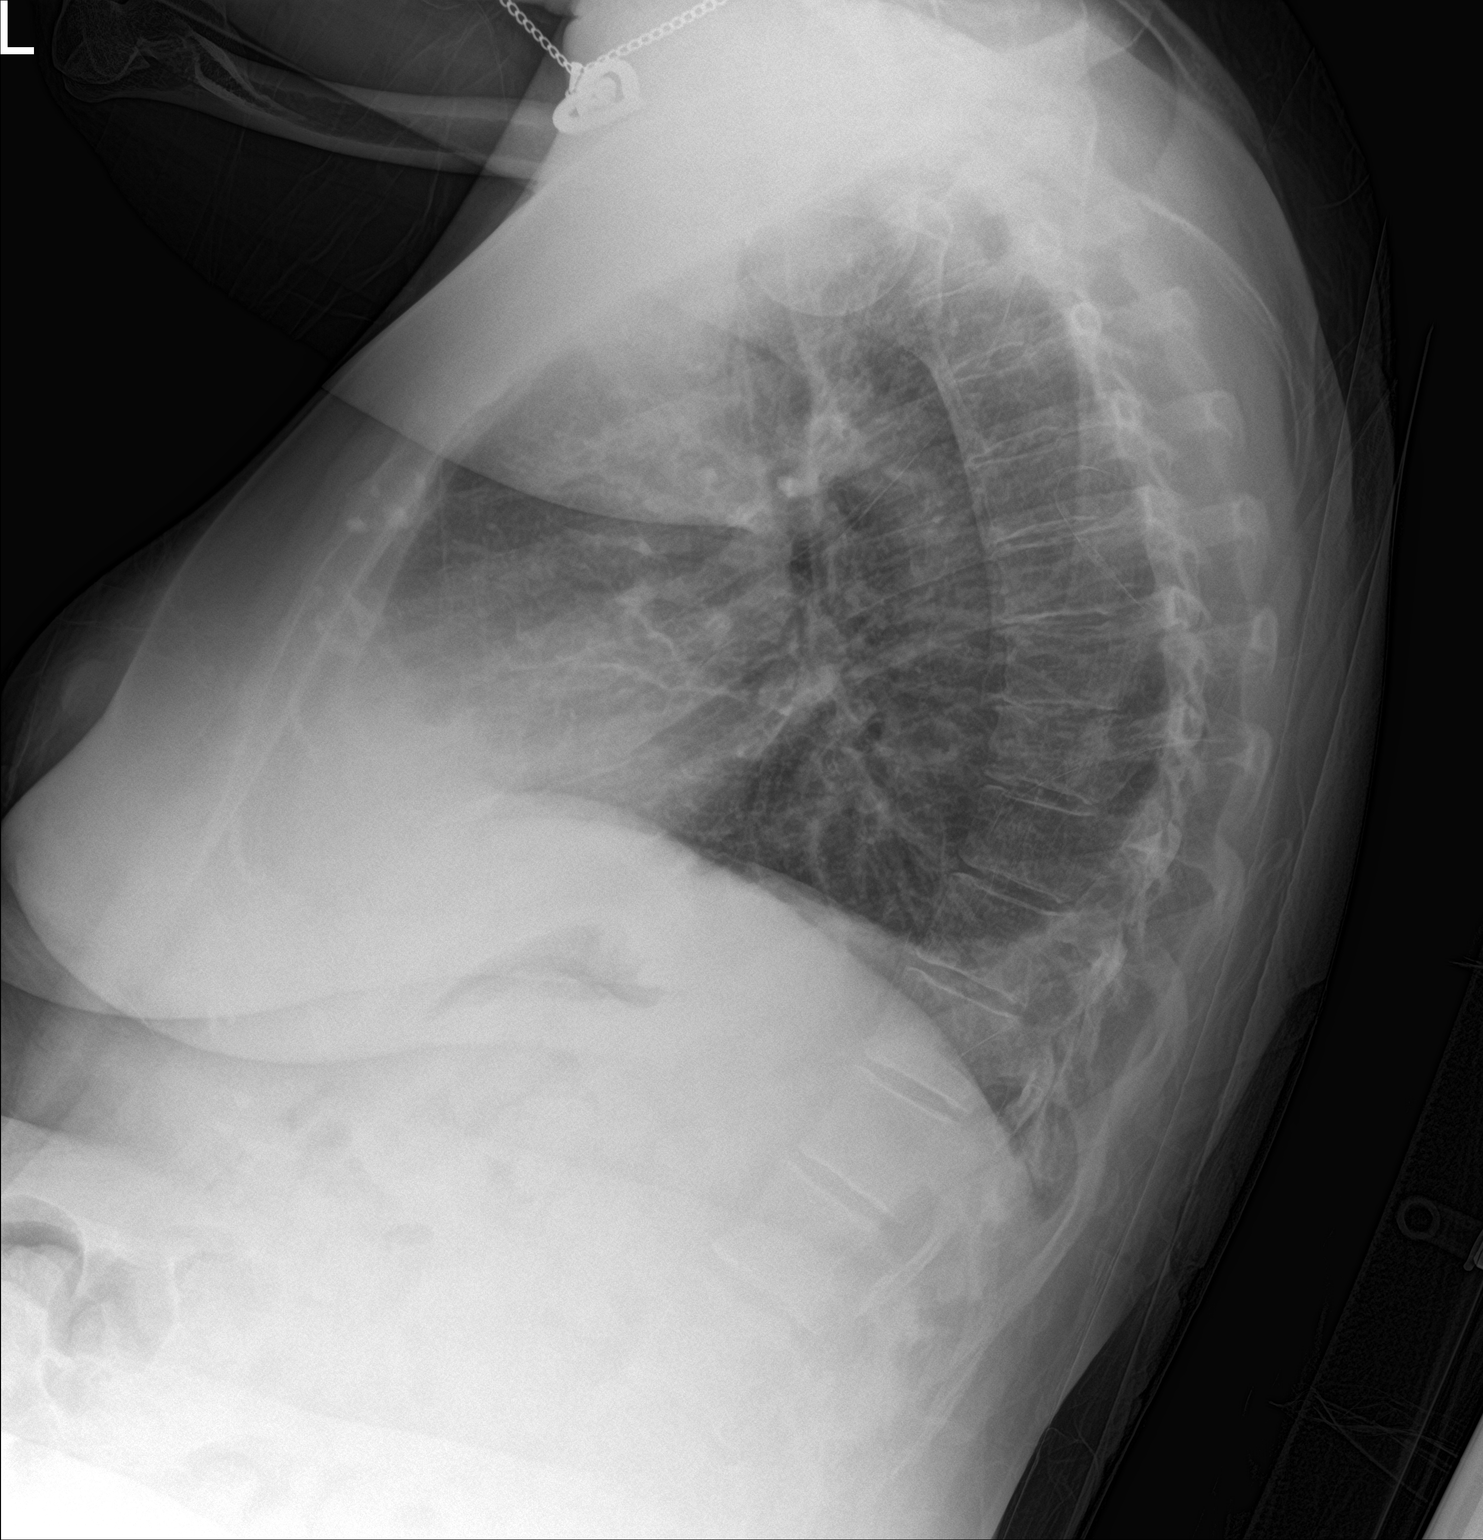

[chest ap]
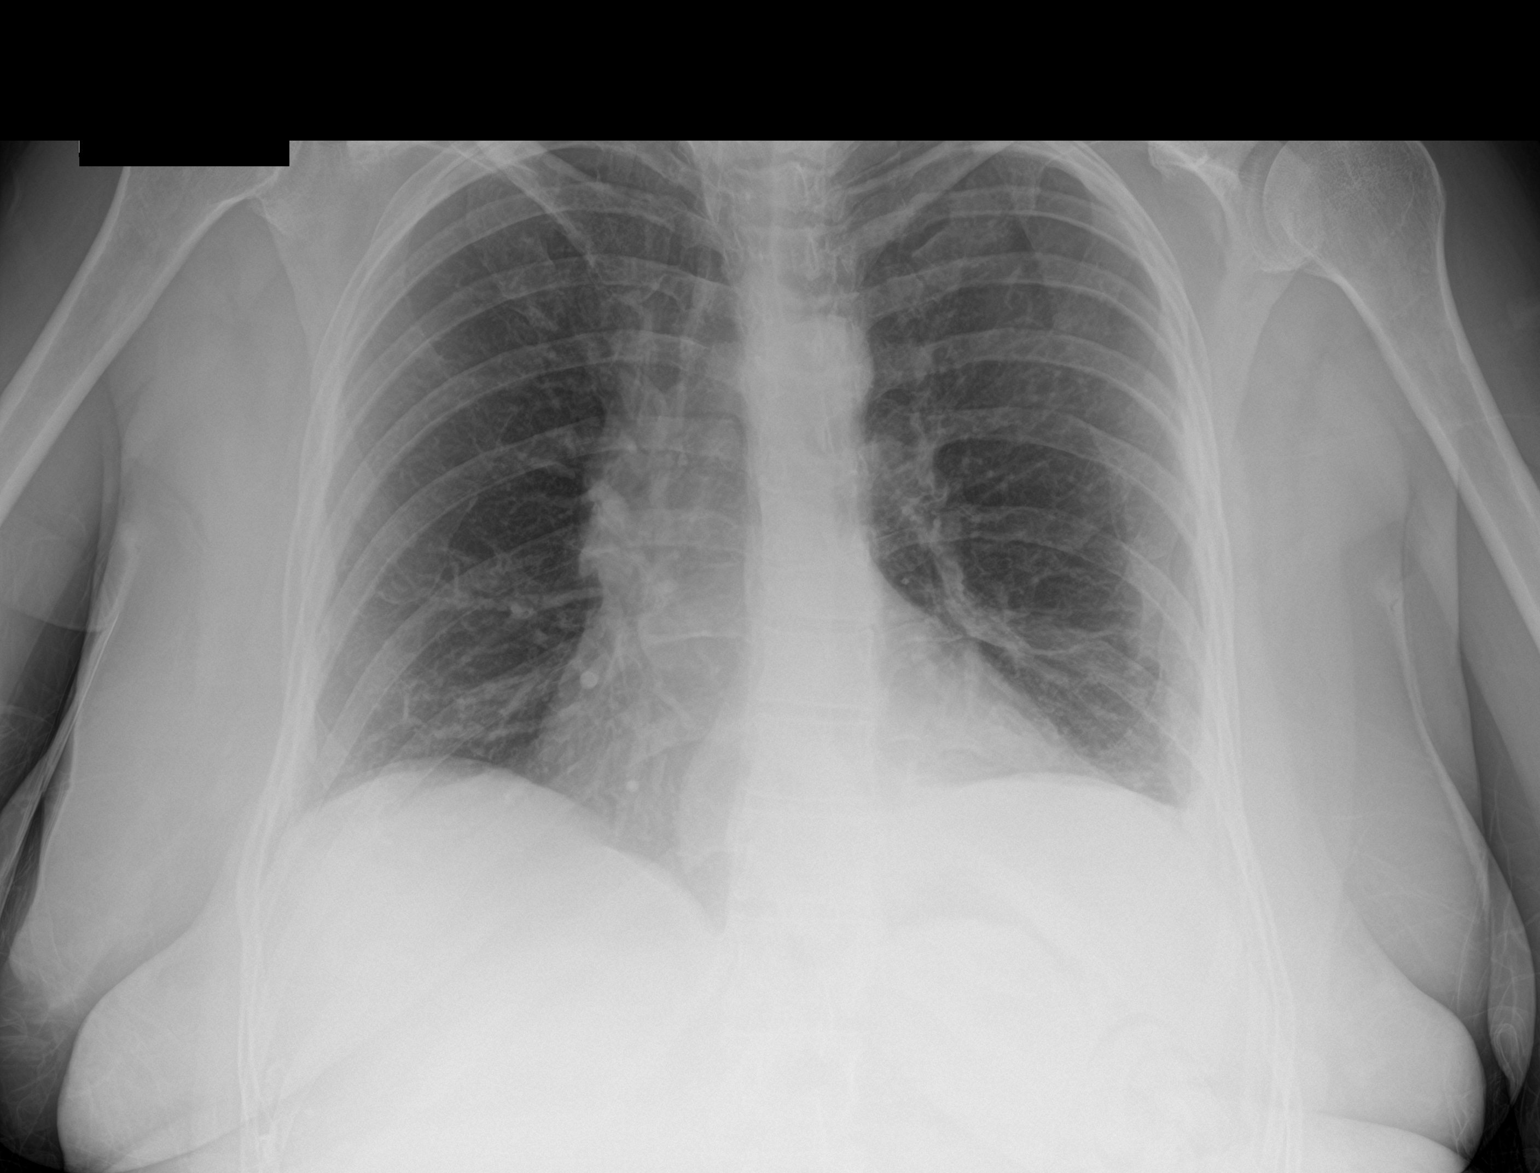

[2 of 2 positions shown; findings below may reference images not displayed]

FINDINGS: Small left pleural effusions/pleural thickening. Mild left basilar
atelectasis. No pneumothorax. Right lung is clear. Normal heart size
and mediastinal contours. Left rib fractures on abdominal CT are not
well seen radiographically.
IMPRESSION: Small left pleural effusion. Left basilar atelectasis. Left rib
fracture on prior abdominal CT is not seen radiographically.

## 2018-05-31 ENCOUNTER — Other Ambulatory Visit: Payer: Self-pay | Admitting: Family Medicine

## 2018-05-31 DIAGNOSIS — R600 Localized edema: Secondary | ICD-10-CM

## 2018-09-28 ENCOUNTER — Encounter (HOSPITAL_COMMUNITY): Payer: Self-pay | Admitting: *Deleted

## 2018-09-28 ENCOUNTER — Encounter (HOSPITAL_COMMUNITY): Payer: Self-pay

## 2018-12-01 ENCOUNTER — Other Ambulatory Visit: Payer: Self-pay

## 2018-12-01 ENCOUNTER — Emergency Department (HOSPITAL_COMMUNITY)
Admission: EM | Admit: 2018-12-01 | Discharge: 2018-12-02 | Disposition: A | Discharge status: Home / Self Care | Payer: Self-pay | Attending: Emergency Medicine | Admitting: Emergency Medicine

## 2018-12-01 ENCOUNTER — Encounter (HOSPITAL_COMMUNITY): Payer: Self-pay | Admitting: Emergency Medicine

## 2018-12-01 DIAGNOSIS — Y939 Activity, unspecified: Secondary | ICD-10-CM | POA: Insufficient documentation

## 2018-12-01 DIAGNOSIS — F17210 Nicotine dependence, cigarettes, uncomplicated: Secondary | ICD-10-CM | POA: Insufficient documentation

## 2018-12-01 DIAGNOSIS — Z79899 Other long term (current) drug therapy: Secondary | ICD-10-CM | POA: Insufficient documentation

## 2018-12-01 DIAGNOSIS — S80211A Abrasion, right knee, initial encounter: Secondary | ICD-10-CM | POA: Insufficient documentation

## 2018-12-01 DIAGNOSIS — S80212A Abrasion, left knee, initial encounter: Secondary | ICD-10-CM | POA: Diagnosis not present

## 2018-12-01 DIAGNOSIS — S32511A Fracture of superior rim of right pubis, initial encounter for closed fracture: Secondary | ICD-10-CM | POA: Insufficient documentation

## 2018-12-01 DIAGNOSIS — S50311A Abrasion of right elbow, initial encounter: Secondary | ICD-10-CM | POA: Insufficient documentation

## 2018-12-01 DIAGNOSIS — Y929 Unspecified place or not applicable: Secondary | ICD-10-CM | POA: Insufficient documentation

## 2018-12-01 DIAGNOSIS — Y999 Unspecified external cause status: Secondary | ICD-10-CM | POA: Diagnosis not present

## 2018-12-01 DIAGNOSIS — S32591A Other specified fracture of right pubis, initial encounter for closed fracture: Secondary | ICD-10-CM | POA: Diagnosis not present

## 2018-12-01 DIAGNOSIS — S90511A Abrasion, right ankle, initial encounter: Secondary | ICD-10-CM | POA: Diagnosis not present

## 2018-12-01 DIAGNOSIS — R102 Pelvic and perineal pain: Secondary | ICD-10-CM | POA: Diagnosis present

## 2018-12-01 NOTE — ED Notes (Signed)
Called pt to recheck vitals. No response.  
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
## 2018-12-02 ENCOUNTER — Emergency Department (HOSPITAL_COMMUNITY): Payer: Self-pay

## 2018-12-02 MED ORDER — IBUPROFEN 400 MG PO TABS
400.0000 mg | ORAL_TABLET | Freq: Once | ORAL | Status: AC
  Administered 2018-12-02: 400 mg via ORAL
  Filled 2018-12-02: qty 1

## 2018-12-02 MED ORDER — NAPROXEN 500 MG PO TABS: 500.0000 mg | ORAL_TABLET | Freq: Two times a day (BID) | ORAL | 0 refills | Status: DC

## 2018-12-02 NOTE — ED Notes (Signed)
Discharge instructions reviewed with pt. Pt verbalized understanding.   
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
## 2018-12-02 NOTE — Discharge Instructions (Addendum)
You may take acetaminophen as needed for additional pain relief.  No weight bearing on your right leg until cleared by the orthopedic doctor.

## 2019-06-22 ENCOUNTER — Other Ambulatory Visit: Payer: Self-pay

## 2019-06-22 ENCOUNTER — Emergency Department (HOSPITAL_BASED_OUTPATIENT_CLINIC_OR_DEPARTMENT_OTHER): Admission: EM | Admit: 2019-06-22 | Discharge: 2019-06-22 | Discharge status: Against Medical Advice | Payer: Self-pay

## 2021-03-10 ENCOUNTER — Other Ambulatory Visit (HOSPITAL_BASED_OUTPATIENT_CLINIC_OR_DEPARTMENT_OTHER): Payer: Self-pay

## 2021-03-10 ENCOUNTER — Ambulatory Visit: Payer: No Typology Code available for payment source

## 2021-06-03 ENCOUNTER — Ambulatory Visit (HOSPITAL_COMMUNITY)
Admission: EM | Admit: 2021-06-03 | Discharge: 2021-06-03 | Payer: Medicaid Other | Attending: Family Medicine | Admitting: Family Medicine

## 2021-06-03 ENCOUNTER — Emergency Department (HOSPITAL_COMMUNITY): Payer: Medicaid Other

## 2021-06-03 ENCOUNTER — Emergency Department (HOSPITAL_COMMUNITY)
Admission: EM | Admit: 2021-06-03 | Discharge: 2021-06-04 | Disposition: A | Discharge status: Home / Self Care | Payer: Medicaid Other | Attending: Emergency Medicine | Admitting: Emergency Medicine

## 2021-06-03 ENCOUNTER — Encounter (HOSPITAL_COMMUNITY): Payer: Self-pay | Admitting: *Deleted

## 2021-06-03 ENCOUNTER — Encounter (HOSPITAL_COMMUNITY): Payer: Self-pay | Admitting: Emergency Medicine

## 2021-06-03 ENCOUNTER — Other Ambulatory Visit: Payer: Self-pay

## 2021-06-03 DIAGNOSIS — J4521 Mild intermittent asthma with (acute) exacerbation: Secondary | ICD-10-CM

## 2021-06-03 DIAGNOSIS — F17200 Nicotine dependence, unspecified, uncomplicated: Secondary | ICD-10-CM | POA: Insufficient documentation

## 2021-06-03 DIAGNOSIS — Z20822 Contact with and (suspected) exposure to covid-19: Secondary | ICD-10-CM | POA: Insufficient documentation

## 2021-06-03 DIAGNOSIS — J4541 Moderate persistent asthma with (acute) exacerbation: Secondary | ICD-10-CM | POA: Insufficient documentation

## 2021-06-03 DIAGNOSIS — R0902 Hypoxemia: Secondary | ICD-10-CM

## 2021-06-03 DIAGNOSIS — R0602 Shortness of breath: Secondary | ICD-10-CM

## 2021-06-03 DIAGNOSIS — Z7951 Long term (current) use of inhaled steroids: Secondary | ICD-10-CM | POA: Insufficient documentation

## 2021-06-03 LAB — BASIC METABOLIC PANEL
Anion gap: 8 (ref 5–15)
BUN: 12 mg/dL (ref 6–20)
CO2: 30 mmol/L (ref 22–32)
Calcium: 9.5 mg/dL (ref 8.9–10.3)
Chloride: 103 mmol/L (ref 98–111)
Creatinine, Ser: 0.82 mg/dL (ref 0.44–1.00)
GFR, Estimated: >60 mL/min (ref >60)
Glucose, Bld: 100 mg/dL — ABNORMAL HIGH (ref 70–99)
Potassium: 4.1 mmol/L (ref 3.5–5.1)
Sodium: 141 mmol/L (ref 135–145)

## 2021-06-03 LAB — CBC WITH DIFFERENTIAL/PLATELET
Abs Immature Granulocytes: 0.03 10*3/uL (ref 0.00–0.07)
Basophils Absolute: 0 10*3/uL (ref 0.0–0.1)
Basophils Relative: 0 %
Eosinophils Absolute: 0.1 10*3/uL (ref 0.0–0.5)
Eosinophils Relative: 1 %
HCT: 41.3 % (ref 36.0–46.0)
Hemoglobin: 13.4 g/dL (ref 12.0–15.0)
Immature Granulocytes: 0 %
Lymphocytes Relative: 10 %
Lymphs Abs: 0.8 10*3/uL (ref 0.7–4.0)
MCH: 29.3 pg (ref 26.0–34.0)
MCHC: 32.4 g/dL (ref 30.0–36.0)
MCV: 90.4 fL (ref 80.0–100.0)
Monocytes Absolute: 0.7 10*3/uL (ref 0.1–1.0)
Monocytes Relative: 8 %
Neutro Abs: 6.8 10*3/uL (ref 1.7–7.7)
Neutrophils Relative %: 81 %
Platelets: 288 10*3/uL (ref 150–400)
RBC: 4.57 MIL/uL (ref 3.87–5.11)
RDW: 13.2 % (ref 11.5–15.5)
WBC: 8.4 10*3/uL (ref 4.0–10.5)
nRBC: 0 % (ref 0.0–0.2)

## 2021-06-03 LAB — RESP PANEL BY RT-PCR (FLU A&B, COVID) ARPGX2
Influenza A by PCR: NEGATIVE
Influenza B by PCR: NEGATIVE
SARS Coronavirus 2 by RT PCR: NEGATIVE

## 2021-06-03 LAB — BRAIN NATRIURETIC PEPTIDE: B Natriuretic Peptide: 46.4 pg/mL (ref 0.0–100.0)

## 2021-06-03 MED ORDER — IPRATROPIUM-ALBUTEROL 0.5-2.5 (3) MG/3ML IN SOLN
3.0000 mL | RESPIRATORY_TRACT | Status: DC
  Filled 2021-06-03: qty 3

## 2021-06-03 MED ORDER — IPRATROPIUM-ALBUTEROL 0.5-2.5 (3) MG/3ML IN SOLN
3.0000 mL | Freq: Once | RESPIRATORY_TRACT | Status: AC
  Administered 2021-06-03: 3 mL via RESPIRATORY_TRACT
  Filled 2021-06-03: qty 3

## 2021-06-03 MED ORDER — IPRATROPIUM-ALBUTEROL 0.5-2.5 (3) MG/3ML IN SOLN
RESPIRATORY_TRACT | Status: AC
  Filled 2021-06-03: qty 3

## 2021-06-03 MED ORDER — PREDNISONE 20 MG PO TABS: 40.0000 mg | ORAL_TABLET | Freq: Every day | ORAL | 0 refills | Status: DC

## 2021-06-03 MED ORDER — METHYLPREDNISOLONE SODIUM SUCC 125 MG IJ SOLR
125.0000 mg | Freq: Once | INTRAMUSCULAR | Status: AC
  Administered 2021-06-04: 125 mg via INTRAVENOUS
  Filled 2021-06-03: qty 2

## 2021-06-03 MED ORDER — METHYLPREDNISOLONE SODIUM SUCC 125 MG IJ SOLR
INTRAMUSCULAR | Status: AC
  Filled 2021-06-03: qty 2

## 2021-06-03 MED ORDER — IPRATROPIUM-ALBUTEROL 0.5-2.5 (3) MG/3ML IN SOLN
3.0000 mL | Freq: Once | RESPIRATORY_TRACT | Status: AC
  Administered 2021-06-03: 3 mL via RESPIRATORY_TRACT

## 2021-06-03 MED ORDER — IPRATROPIUM-ALBUTEROL 0.5-2.5 (3) MG/3ML IN SOLN: 3.0000 mL | RESPIRATORY_TRACT | Status: DC | PRN

## 2021-06-03 MED ORDER — METHYLPREDNISOLONE SODIUM SUCC 125 MG IJ SOLR
125.0000 mg | Freq: Once | INTRAMUSCULAR | Status: AC
  Administered 2021-06-03: 125 mg via INTRAMUSCULAR

## 2021-06-03 NOTE — ED Provider Triage Note (Signed)
Emergency Medicine Provider Triage Evaluation Note ? ?Christy Barnett , a 51 y.o. female  was evaluated in triage.  Pt complains of shortness of breath and cough over the last 2 days.  No known recent sick contacts.  Hx of asthma.  Denies fever, recent upper respiratory symptoms, N/V, abdominal pain, lower extremity swelling.  Further defines chest pain as a "tightness" and not really any pain.  Cough is unproductive.  Hx of hep C.  No recent antibiotic use. ? ?Review of Systems  ?Positive: As above ?Negative: As above ? ?Physical Exam  ?BP (!) 151/96   Pulse (!) 110   Temp 98.7 ?F (37.1 ?C) (Oral)   Resp (!) 30   Ht 5\' 2"  (1.575 m)   Wt 90.7 kg   SpO2 98%   BMI 36.58 kg/m?  ?Gen:   Awake, no distress, mildly ill-appearing ?Resp:  Increased respiratory effort, global wheezing and rales bilaterally, RR of 30 ?MSK:   Moves extremities without difficulty  ?Other:  Tachycardic with regular rhythm.  Abdomen soft nontender.  Chest non-TTP.  No lower extremity edema appreciated. ? ?Medical Decision Making  ?Medically screening exam initiated at 9:22 PM.  Appropriate orders placed.  Christy Barnett was informed that the remainder of the evaluation will be completed by another provider, this initial triage assessment does not replace that evaluation, and the importance of remaining in the ED until their evaluation is complete. ? ?Labs, imaging, EKG ordered. ? ?Discussed patient with triage nurse.  Patient will be brought back to the next available room. ?  ?Christy Ee, PA-C ?06/03/21 2145 ? ?

## 2021-06-03 NOTE — ED Triage Notes (Signed)
Pt tri-poding on arrival to room and [facility]. Pt reports using her rescue inhaler. Pt reports SHOB x 2 days. ?

## 2021-06-03 NOTE — ED Notes (Signed)
Patient transported to X-ray 

## 2021-06-03 NOTE — ED Provider Notes (Signed)
?[facility] ?Provider Note ? ? ?CSN: [id number] ?Arrival date & time: 06/03/21  2002 ? ?  ? ?History ? ?Chief Complaint  ?Patient presents with  ? Shortness of Breath  ? ? ?Christy Barnett is a 51 y.o. female with past medical history significant for tobacco abuse, heroin use who reports that she has been drug-free, smoking free for several months, additionally with history of bipolar disease, asthma, bronchitis, hep C who presents with shortness of breath, cough, chest tightness/pain since yesterday.  She denies any exertional component to the chest pain, denies nausea, vomiting, diaphoresis, fever, chills.  She denies any abdominal pain, diarrhea, constipation.  She denies any dysuria, hematuria.  She denies any confusion, weakness, but does feel not herself.  She does report a history of allergies and says that occasionally allergies can cause her to have asthma exacerbations.  She denies any recent sick contacts. ? ? ?Shortness of Breath ? ?  ? ?Home Medications ?Prior to Admission medications   ?Medication Sig Start Date End Date Taking? Authorizing Provider  ?albuterol (VENTOLIN HFA) 108 (90 Base) MCG/ACT inhaler Inhale 2 puffs into the lungs every 6 (six) hours as needed for wheezing or shortness of breath. 05/20/21  Yes [provider]  ?furosemide (LASIX) 40 MG tablet Take 40 mg by mouth daily as needed for fluid or edema. 05/20/21  Yes [provider]  ?hydrochlorothiazide (HYDRODIURIL) 25 MG tablet Take 12.5 mg by mouth daily.   Yes [provider]  ?ibuprofen (ADVIL) 200 MG tablet Take 200 mg by mouth every 6 (six) hours as needed for headache or moderate pain.   Yes [provider]  ?OVER THE COUNTER MEDICATION Take 1 capsule by mouth daily. Focus factor   Yes [provider]  ?valACYclovir (VALTREX) 1000 MG tablet Take 1,000 mg by mouth 2 (two) times daily. 04/04/21  Yes [provider]  ?methadone (DOLOPHINE) 10  MG/ML solution Take 100 mg by mouth See admin instructions. Monday,Wednesday,saturday    [provider]  ?predniSONE (DELTASONE) 20 MG tablet Take 2 tablets (40 mg total) by mouth daily. ?Patient not taking: Reported on 06/04/2021 06/03/21   Mardella LaymanHagler, Brian, MD  ?potassium chloride SA (K-DUR,KLOR-CON) 20 MEQ tablet Take 1 tablet (20 mEq total) by mouth 2 (two) times daily as needed (with lasix). ?Patient not taking: Reported on 12/02/2018 08/02/17 12/02/18  Mike Gipouglas, Andre, FNP  ?   ? ?Allergies    ?Patient has no known allergies.   ? ?Review of Systems   ?Review of Systems  ?Respiratory:  Positive for chest tightness and shortness of breath.   ?All other systems reviewed and are negative. ? ?Physical Exam ?Updated Vital Signs ?BP 136/67   Pulse 82   Temp 98.7 ?F (37.1 ?C) (Oral)   Resp 16   Ht 5\' 2"  (1.575 m)   Wt 90.7 kg   SpO2 96%   BMI 36.58 kg/m?  ?Physical Exam ?Vitals and nursing note reviewed.  ?Constitutional:   ?   General: She is not in acute distress. ?   Appearance: Normal appearance. She is ill-appearing.  ?HENT:  ?   Head: Normocephalic and atraumatic.  ?   Comments: some congestion noted ?Eyes:  ?   General:     ?   Right eye: No discharge.     ?   Left eye: No discharge.  ?Cardiovascular:  ?   Rate and Rhythm: Normal rate and regular rhythm.  ?   Heart sounds: No  murmur heard. ?  No friction rub. No gallop.  ?   Comments: Patient with tachycardia on arrival, she is with normal heart rate on my evaluation. ?Pulmonary:  ?   Effort: Pulmonary effort is normal.  ?   Breath sounds: Normal breath sounds.  ?Abdominal:  ?   General: Bowel sounds are normal.  ?   Palpations: Abdomen is soft.  ?Skin: ?   General: Skin is warm and dry.  ?   Capillary Refill: Capillary refill takes less than 2 seconds.  ?Neurological:  ?   Mental Status: She is alert and oriented to person, place, and time.  ?Psychiatric:     ?   Mood and Affect: Mood normal.     ?   Behavior: Behavior normal.  ? ? ?ED Results /  Procedures / Treatments   ?Labs ?(all labs ordered are listed, but only abnormal results are displayed) ?Labs Reviewed  ?BASIC METABOLIC PANEL - Abnormal; Notable for the following components:  ?    Result Value  ? Glucose, Bld 100 (*)   ? All other components within normal limits  ?RESP PANEL BY RT-PCR (FLU A&B, COVID) ARPGX2  ?CBC WITH DIFFERENTIAL/PLATELET  ?BRAIN NATRIURETIC PEPTIDE  ?TROPONIN I (HIGH SENSITIVITY)  ? ? ?EKG ?None ? ?Radiology ?DG Chest 2 View ? ?Result Date: 06/03/2021 ?CLINICAL DATA:  Cough and shortness of breath EXAM: CHEST - 2 VIEW COMPARISON:  05/26/2021 FINDINGS: The heart size and mediastinal contours are within normal limits. Both lungs are clear. The visualized skeletal structures are unremarkable. IMPRESSION: No acute abnormality noted. Electronically Signed   By: Alcide Clever M.D.   On: 06/03/2021 22:15   ? ?Procedures ?Procedures  ? ? ?Medications Ordered in ED ?Medications  ?ipratropium-albuterol (DUONEB) 0.5-2.5 (3) MG/3ML nebulizer solution 3 mL (3 mLs Nebulization Not Given 06/04/21 0244)  ?ipratropium-albuterol (DUONEB) 0.5-2.5 (3) MG/3ML nebulizer solution 3 mL (has no administration in time range)  ?ipratropium-albuterol (DUONEB) 0.5-2.5 (3) MG/3ML nebulizer solution 3 mL (3 mLs Nebulization Given 06/03/21 2349)  ?methylPREDNISolone sodium succinate (SOLU-MEDROL) 125 mg/2 mL injection 125 mg (125 mg Intravenous Given 06/04/21 0056)  ?ketorolac (TORADOL) 15 MG/ML injection 15 mg (15 mg Intravenous Given 06/04/21 0057)  ?acetaminophen (TYLENOL) tablet 650 mg (650 mg Oral Given 06/04/21 0100)  ?albuterol (PROVENTIL) (2.5 MG/3ML) 0.083% nebulizer solution (10 mg/hr Nebulization Given 06/04/21 0130)  ? ? ?ED Course/ Medical Decision Making/ A&P ?Clinical Course as of 06/04/21 0246  ?Wed Jun 04, 2021  ?4665 Patient with desaturation to 92% on room air, she was assessed by respiratory and placed on 2 L nasal cannula. [CP]  ?  ?Clinical Course User Index ?[CP] Shital Crayton H, PA-C   ? ?                        ?Medical Decision Making ?Risk ?OTC drugs. ?Prescription drug management. ? ? ?This patient is a 51 y.o. female who presents to the ED for concern of shortness of breath, congestion, chest tightness, weakness, chest pain which she describes as "tightness", this involves an extensive number of treatment options, and is a complaint that carries with it a high risk of complications and morbidity. The emergent differential diagnosis prior to evaluation includes, but is not limited to, asthma exacerbation, COPD exacerbation, pneumonia, acute bronchitis, ACS, ACS, pulmonary embolism, other acute intrathoracic abnormality, COVID, flu, versus other.  ? ?This is not an exhaustive differential.  ? ?Past Medical History / Co-morbidities / Social History: ?tobacco abuse,  heroin use who reports that she has been drug-free, smoking free for several months, additionally with history of bipolar disease, asthma, bronchitis, hep C ? ?Additional history: ?Chart reviewed. Pertinent results include: Read patient's evaluation at the urgent care just prior to arrival, where she was told to present to the ED setting symptoms, reviewed outpatient psychology, neurology, family medicine visits, previous emergency department lab work and imaging ? ?Physical Exam: ?Physical exam performed. The pertinent findings include: Patient with significant biphasic wheezing, some signs of respiratory distress, tachypnea, tachycardia on arrival.  After reevaluation from DuoNeb x1, Solu-Medrol, and continuous nebulization for approximately 30 minutes patient with improvement of wheezing bilaterally, no longer in respiratory distress.  She does still have some tachycardia which is transient, likely secondary to aggressive albuterol use.  Prior to continuous nebulization patient had resolution of her tachycardia, I have low clinical suspicion for pulmonary embolism based on her presentation. ? ?Lab Tests: ?I ordered, and personally  interpreted labs.  The pertinent results include: Unremarkable CBC, BMP, BNP, RVP negative for COVID, flu.  Negative troponin x1 in context of chest pain described as tightness associated with notable asthma exacer

## 2021-06-03 NOTE — ED Triage Notes (Signed)
Pt c/o SOB with cough and chest pain since yesterday, hx of asthma and bronchitis  ?

## 2021-06-03 NOTE — Discharge Instructions (Addendum)
Meds ordered this encounter  ?Medications  ? ipratropium-albuterol (DUONEB) 0.5-2.5 (3) MG/3ML nebulizer solution 3 mL  ? methylPREDNISolone sodium succinate (SOLU-MEDROL) 125 mg/2 mL injection 125 mg  ? predniSONE (DELTASONE) 20 MG tablet  ?  Sig: Take 2 tablets (40 mg total) by mouth daily.  ?  Dispense:  10 tablet  ?  Refill:  0  ? ? ?

## 2021-06-04 LAB — TROPONIN I (HIGH SENSITIVITY): Troponin I (High Sensitivity): 4 ng/L (ref <18)

## 2021-06-04 MED ORDER — KETOROLAC TROMETHAMINE 15 MG/ML IJ SOLN
15.0000 mg | Freq: Once | INTRAMUSCULAR | Status: AC
  Administered 2021-06-04: 15 mg via INTRAVENOUS
  Filled 2021-06-04: qty 1

## 2021-06-04 MED ORDER — ALBUTEROL SULFATE (2.5 MG/3ML) 0.083% IN NEBU
10.0000 mg/h | INHALATION_SOLUTION | Freq: Once | RESPIRATORY_TRACT | Status: AC
  Administered 2021-06-04: 10 mg/h via RESPIRATORY_TRACT
  Filled 2021-06-04: qty 12

## 2021-06-04 MED ORDER — ACETAMINOPHEN 325 MG PO TABS
650.0000 mg | ORAL_TABLET | Freq: Once | ORAL | Status: AC
  Administered 2021-06-04: 650 mg via ORAL
  Filled 2021-06-04: qty 2

## 2021-06-04 NOTE — ED Provider Notes (Signed)
?[facility] ? ? ?[id number] ?06/03/21 Arrival Time: 1837 ? ?ASSESSMENT & PLAN: ? ?1. SOB (shortness of breath)   ?2. Mild intermittent asthma with acute exacerbation   ?3. Hypoxia   ? ?Not very responsive to Duoneb x 2 and IM Solu-medrol. With continued labored breathing. To ED for further evaluation. Declines EMS transport. By private vehicle. ? ?Meds ordered this encounter  ?Medications  ? ipratropium-albuterol (DUONEB) 0.5-2.5 (3) MG/3ML nebulizer solution 3 mL  ? methylPREDNISolone sodium succinate (SOLU-MEDROL) 125 mg/2 mL injection 125 mg  ? predniSONE (DELTASONE) 20 MG tablet  ?  Sig: Take 2 tablets (40 mg total) by mouth daily.  ?  Dispense:  10 tablet  ?  Refill:  0  ? ? ?Recommend: ? Follow-up Information   ? ? [facility] EMERGENCY DEPARTMENT. Go to .   ?Specialty: Emergency Medicine ?Contact information: ?[address] ?[id number] mc ?[place] [address] ?[phone] ? ?  ?  ? ?  ?  ? ?  ? ? ?Reviewed expectations re: course of current medical issues. Questions answered. ?Outlined signs and symptoms indicating need for more acute intervention. ?Patient verbalized understanding. ?After Visit Summary given. ? ?SUBJECTIVE: ?History from: patient. ? ?LINDSAY STRAKA is a 51 y.o. female who presents with complaint of wheezing/SOB; fairly gradual onset over past 48 hours ; h/o asthma; unclear trigger. Worsening SOB today. Albuterol inhaler without much relief. No specific CP. Afebrile. ?Overall normal PO intake without n/v. Sick contacts: no. Ambulatory without difficulty. No LE edema. ? ?Social History  ? ?Tobacco Use  ?Smoking Status Every Day  ? Packs/day: 0.50  ? Types: Cigarettes  ?Smokeless Tobacco Never  ? ? ?OBJECTIVE: ? ?Vitals:  ? 06/03/21 1904 06/03/21 1951  ?BP: (!) 141/86   ?Pulse: 89   ?Resp: (!) 24   ?SpO2: 90% (S) 90%  ?  ?Labored breathing upon arrival. Immediately brought to room. ?General appearance: alert ?HEENT: [place]; AT; with mild nasal  congestion ?Neck: supple without LAD ?CV: tachycardic at 102 upon my assessment ?Lungs: tachypnea noted; labored respirations, moderate bilateral expiratory wheezing; cough: is present; no significant respiratory distress ?Skin: warm and dry ?Psychological: alert and cooperative; normal mood and affect ? ?No Known Allergies ? ?Past Medical History:  ?Diagnosis Date  ? Asthma   ? Bronchitis   ? Hepatitis C   ? Heroin abuse (HCC)   ? ?Family History  ?Problem Relation Age of Onset  ? Heart disease Father   ? Heart disease Paternal Grandfather   ? COPD Mother   ? ?Social History  ? ?Socioeconomic History  ? Marital status: Widowed  ?  Spouse name: Not on file  ? Number of children: Not on file  ? Years of education: Not on file  ? Highest education level: Not on file  ?Occupational History  ? Not on file  ?Tobacco Use  ? Smoking status: Every Day  ?  Packs/day: 0.50  ?  Types: Cigarettes  ? Smokeless tobacco: Never  ?Vaping Use  ? Vaping Use: Every day  ?Substance and Sexual Activity  ? Alcohol use: No  ? Drug use: Not Currently  ?  Comment: xanax  ? Sexual activity: Not on file  ?Other Topics Concern  ? Not on file  ?Social History Narrative  ? ** Merged History Encounter **  ?    ? ?Social Determinants of Health  ? ?Financial Resource Strain: Not on file  ?Food Insecurity: Not on file  ?Transportation Needs: Not on file  ?  Physical Activity: Not on file  ?Stress: Not on file  ?Social Connections: Not on file  ?Intimate Partner Violence: Not on file  ? ? ? ? ? ? ? ? ? ?  ?[name], MD ?06/04/21 [phone] ? ?

## 2021-06-04 NOTE — ED Notes (Signed)
DC instructions reviewed with pt. PT verbalized understanding. PT DC °

## 2021-06-04 NOTE — Discharge Instructions (Addendum)
Recommend that you begin taking some medicine for allergies once daily, it is useful to take something like Allegra, Zyrtec, or Xyzal Every day even if you are not having symptoms as it has a positive effect if used over time.  Continue use your at home albuterol as needed.  Take the entire course of steroids that were prescribed.  The provider at urgent care sent them over earlier today to the [facility] on [address] from what I can see on your records.  If for some reason they do not have the prescription please come back to the emergency department and I will be happy to prescribe them.  I recommend that you follow-up with your primary care provider within the next few weeks to ensure that you are still feeling better.  If you begin to feel worse, worsening shortness of breath, new chest pain please return to the emergency department for further evaluation.  It was a pleasure taking care of you today. ?

## 2021-06-08 ENCOUNTER — Inpatient Hospital Stay (HOSPITAL_COMMUNITY)
Admission: EM | Admit: 2021-06-08 | Discharge: 2021-06-12 | DRG: 202 | Disposition: A | Discharge status: Home / Self Care | Payer: Medicaid Other | Attending: Family Medicine | Admitting: Family Medicine

## 2021-06-08 ENCOUNTER — Emergency Department (HOSPITAL_COMMUNITY): Payer: Medicaid Other

## 2021-06-08 ENCOUNTER — Other Ambulatory Visit: Payer: Self-pay

## 2021-06-08 ENCOUNTER — Encounter (HOSPITAL_COMMUNITY): Payer: Self-pay | Admitting: Emergency Medicine

## 2021-06-08 DIAGNOSIS — I10 Essential (primary) hypertension: Secondary | ICD-10-CM | POA: Diagnosis present

## 2021-06-08 DIAGNOSIS — R0902 Hypoxemia: Secondary | ICD-10-CM | POA: Diagnosis present

## 2021-06-08 DIAGNOSIS — Z79899 Other long term (current) drug therapy: Secondary | ICD-10-CM

## 2021-06-08 DIAGNOSIS — Z20822 Contact with and (suspected) exposure to covid-19: Secondary | ICD-10-CM | POA: Diagnosis present

## 2021-06-08 DIAGNOSIS — Z6836 Body mass index (BMI) 36.0-36.9, adult: Secondary | ICD-10-CM | POA: Diagnosis not present

## 2021-06-08 DIAGNOSIS — J9601 Acute respiratory failure with hypoxia: Secondary | ICD-10-CM | POA: Diagnosis present

## 2021-06-08 DIAGNOSIS — F319 Bipolar disorder, unspecified: Secondary | ICD-10-CM | POA: Diagnosis present

## 2021-06-08 DIAGNOSIS — J4541 Moderate persistent asthma with (acute) exacerbation: Principal | ICD-10-CM | POA: Diagnosis present

## 2021-06-08 DIAGNOSIS — E669 Obesity, unspecified: Secondary | ICD-10-CM | POA: Diagnosis present

## 2021-06-08 DIAGNOSIS — J45909 Unspecified asthma, uncomplicated: Secondary | ICD-10-CM | POA: Diagnosis present

## 2021-06-08 DIAGNOSIS — Z825 Family history of asthma and other chronic lower respiratory diseases: Secondary | ICD-10-CM

## 2021-06-08 DIAGNOSIS — E875 Hyperkalemia: Secondary | ICD-10-CM | POA: Diagnosis present

## 2021-06-08 DIAGNOSIS — F17210 Nicotine dependence, cigarettes, uncomplicated: Secondary | ICD-10-CM | POA: Diagnosis present

## 2021-06-08 DIAGNOSIS — Z8249 Family history of ischemic heart disease and other diseases of the circulatory system: Secondary | ICD-10-CM

## 2021-06-08 DIAGNOSIS — A530 Latent syphilis, unspecified as early or late: Secondary | ICD-10-CM | POA: Diagnosis present

## 2021-06-08 DIAGNOSIS — J45901 Unspecified asthma with (acute) exacerbation: Secondary | ICD-10-CM

## 2021-06-08 DIAGNOSIS — B182 Chronic viral hepatitis C: Secondary | ICD-10-CM | POA: Diagnosis present

## 2021-06-08 DIAGNOSIS — B1920 Unspecified viral hepatitis C without hepatic coma: Secondary | ICD-10-CM

## 2021-06-08 LAB — RAPID URINE DRUG SCREEN, HOSP PERFORMED
Amphetamines: NOT DETECTED
Barbiturates: NOT DETECTED
Benzodiazepines: NOT DETECTED
Cocaine: NOT DETECTED
Opiates: NOT DETECTED
Tetrahydrocannabinol: NOT DETECTED

## 2021-06-08 LAB — CBC
HCT: 42.2 % (ref 36.0–46.0)
Hemoglobin: 13.7 g/dL (ref 12.0–15.0)
MCH: 29.1 pg (ref 26.0–34.0)
MCHC: 32.5 g/dL (ref 30.0–36.0)
MCV: 89.6 fL (ref 80.0–100.0)
Platelets: 302 10*3/uL (ref 150–400)
RBC: 4.71 MIL/uL (ref 3.87–5.11)
RDW: 13.2 % (ref 11.5–15.5)
WBC: 9.9 10*3/uL (ref 4.0–10.5)
nRBC: 0 % (ref 0.0–0.2)

## 2021-06-08 LAB — I-STAT BETA HCG BLOOD, ED (MC, WL, AP ONLY): I-stat hCG, quantitative: <5 m[IU]/mL (ref <5)

## 2021-06-08 LAB — BASIC METABOLIC PANEL
Anion gap: 5 (ref 5–15)
BUN: 15 mg/dL (ref 6–20)
CO2: 32 mmol/L (ref 22–32)
Calcium: 9.2 mg/dL (ref 8.9–10.3)
Chloride: 102 mmol/L (ref 98–111)
Creatinine, Ser: 0.9 mg/dL (ref 0.44–1.00)
GFR, Estimated: >60 mL/min (ref >60)
Glucose, Bld: 126 mg/dL — ABNORMAL HIGH (ref 70–99)
Potassium: 5.2 mmol/L — ABNORMAL HIGH (ref 3.5–5.1)
Sodium: 139 mmol/L (ref 135–145)

## 2021-06-08 LAB — RESP PANEL BY RT-PCR (FLU A&B, COVID) ARPGX2
Influenza A by PCR: NEGATIVE
Influenza B by PCR: NEGATIVE
SARS Coronavirus 2 by RT PCR: NEGATIVE

## 2021-06-08 LAB — TROPONIN I (HIGH SENSITIVITY): Troponin I (High Sensitivity): 3 ng/L (ref <18)

## 2021-06-08 LAB — TSH: TSH: 0.414 u[IU]/mL (ref 0.350–4.500)

## 2021-06-08 LAB — HIV ANTIBODY (ROUTINE TESTING W REFLEX): HIV Screen 4th Generation wRfx: NONREACTIVE

## 2021-06-08 LAB — GROUP A STREP BY PCR: Group A Strep by PCR: NOT DETECTED

## 2021-06-08 MED ORDER — METHADONE HCL 10 MG PO TABS
100.0000 mg | ORAL_TABLET | ORAL | Status: DC
  Administered 2021-06-09 – 2021-06-11 (×2): 100 mg via ORAL
  Filled 2021-06-08 (×2): qty 10

## 2021-06-08 MED ORDER — IPRATROPIUM BROMIDE 0.02 % IN SOLN
1.0000 mg | Freq: Once | RESPIRATORY_TRACT | Status: AC
  Administered 2021-06-08: 1 mg via RESPIRATORY_TRACT
  Filled 2021-06-08: qty 5

## 2021-06-08 MED ORDER — ALBUTEROL (5 MG/ML) CONTINUOUS INHALATION SOLN
10.0000 mg/h | INHALATION_SOLUTION | Freq: Once | RESPIRATORY_TRACT | Status: AC
  Administered 2021-06-08: 10 mg/h via RESPIRATORY_TRACT
  Filled 2021-06-08: qty 0.5

## 2021-06-08 MED ORDER — ORAL CARE MOUTH RINSE
15.0000 mL | Freq: Two times a day (BID) | OROMUCOSAL | Status: DC
  Administered 2021-06-08 – 2021-06-11 (×7): 15 mL via OROMUCOSAL

## 2021-06-08 MED ORDER — ENOXAPARIN SODIUM 40 MG/0.4ML IJ SOSY
40.0000 mg | PREFILLED_SYRINGE | INTRAMUSCULAR | Status: DC
  Administered 2021-06-08 – 2021-06-12 (×5): 40 mg via SUBCUTANEOUS
  Filled 2021-06-08 (×5): qty 0.4

## 2021-06-08 MED ORDER — METHADONE HCL 10 MG/ML PO CONC
100.0000 mg | ORAL | Status: DC
  Filled 2021-06-08: qty 10

## 2021-06-08 MED ORDER — HYDROCHLOROTHIAZIDE 12.5 MG PO TABS: 12.5000 mg | ORAL_TABLET | Freq: Every day | ORAL | Status: DC

## 2021-06-08 MED ORDER — ALBUTEROL SULFATE HFA 108 (90 BASE) MCG/ACT IN AERS: 2.0000 | INHALATION_SPRAY | Freq: Four times a day (QID) | RESPIRATORY_TRACT | Status: DC | PRN

## 2021-06-08 MED ORDER — METHADONE HCL 10 MG/ML PO CONC: 100.0000 mg | ORAL | Status: DC

## 2021-06-08 MED ORDER — IBUPROFEN 200 MG PO TABS
200.0000 mg | ORAL_TABLET | Freq: Four times a day (QID) | ORAL | Status: DC | PRN
  Administered 2021-06-09 – 2021-06-10 (×3): 200 mg via ORAL
  Filled 2021-06-08 (×3): qty 1

## 2021-06-08 MED ORDER — METHYLPREDNISOLONE SODIUM SUCC 40 MG IJ SOLR
40.0000 mg | Freq: Three times a day (TID) | INTRAMUSCULAR | Status: DC
  Administered 2021-06-08 – 2021-06-09 (×2): 40 mg via INTRAVENOUS
  Filled 2021-06-08 (×2): qty 1

## 2021-06-08 MED ORDER — IPRATROPIUM-ALBUTEROL 0.5-2.5 (3) MG/3ML IN SOLN
3.0000 mL | Freq: Four times a day (QID) | RESPIRATORY_TRACT | Status: DC
  Administered 2021-06-08 – 2021-06-12 (×16): 3 mL via RESPIRATORY_TRACT
  Filled 2021-06-08 (×16): qty 3

## 2021-06-08 MED ORDER — ALBUTEROL SULFATE (2.5 MG/3ML) 0.083% IN NEBU: 2.5000 mg | INHALATION_SOLUTION | Freq: Four times a day (QID) | RESPIRATORY_TRACT | Status: DC | PRN

## 2021-06-08 MED ORDER — FLUTICASONE FUROATE-VILANTEROL 100-25 MCG/ACT IN AEPB
1.0000 | INHALATION_SPRAY | Freq: Every day | RESPIRATORY_TRACT | Status: DC
  Administered 2021-06-09 – 2021-06-12 (×4): 1 via RESPIRATORY_TRACT
  Filled 2021-06-08: qty 28

## 2021-06-08 MED ORDER — METHYLPREDNISOLONE SODIUM SUCC 125 MG IJ SOLR
125.0000 mg | Freq: Once | INTRAMUSCULAR | Status: AC
  Administered 2021-06-08: 125 mg via INTRAVENOUS
  Filled 2021-06-08: qty 2

## 2021-06-08 MED ORDER — NICOTINE 14 MG/24HR TD PT24
14.0000 mg | MEDICATED_PATCH | Freq: Every day | TRANSDERMAL | Status: DC
  Administered 2021-06-09 – 2021-06-11 (×4): 14 mg via TRANSDERMAL
  Filled 2021-06-08 (×4): qty 1

## 2021-06-08 MED ORDER — BUDESONIDE 0.25 MG/2ML IN SUSP
0.2500 mg | Freq: Two times a day (BID) | RESPIRATORY_TRACT | Status: DC
  Administered 2021-06-08 – 2021-06-12 (×8): 0.25 mg via RESPIRATORY_TRACT
  Filled 2021-06-08 (×8): qty 2

## 2021-06-08 MED ORDER — MAGNESIUM SULFATE 2 GM/50ML IV SOLN
2.0000 g | Freq: Once | INTRAVENOUS | Status: AC
  Administered 2021-06-08: 2 g via INTRAVENOUS
  Filled 2021-06-08: qty 50

## 2021-06-08 NOTE — ED Triage Notes (Addendum)
Pt reports chest pain and SOB x 1 week with non-productive cough.  States she was seen here 5/16 for same. 88% on room air.  Placed on 2 liters [place] on arrival.

## 2021-06-08 NOTE — ED Provider Notes (Signed)
[facility] EMERGENCY DEPARTMENT Provider Note   CSN: [id number] Arrival date & time: 06/08/21  0957     History  Chief Complaint  Patient presents with   Shortness of Breath    Christy Barnett is a 51 y.o. female with history of asthma, hepatitis C, substance abuse who presents to the ED for evaluation of wheezing and shortness of breath x1 week.  Patient went to urgent care on 06/03/2021 for shortness of breath and asthma exacerbation and was given 2 DuoNebs and IM Solu-Medrol with continued labored breathing.  She was then referred to the emergency department where she was given another dose of Solu-Medrol, multiple DuoNebs and continuous albuterol with some improvement.  Per chart review, she reportedly did desaturate to 92% on room air and was temporarily placed on 2 L nasal cannula.  After treatment, she was able to maintain adequate oxygen without supplementation and was discharged home with steroid burst and albuterol inhalers.  Patient states that when she left the emergency department, she only felt slightly better.  She has taken her entire course of steroids and an antibiotic prescribed by her primary care doctor but continues to worsen.  Her chest x-rays have been clear.  She denies chest pain, abdominal pain, fevers, chills, congestion, nausea, vomiting and diarrhea.   Shortness of Breath     Home Medications Prior to Admission medications   Medication Sig Start Date End Date Taking? Authorizing Provider  albuterol (VENTOLIN HFA) 108 (90 Base) MCG/ACT inhaler Inhale 2 puffs into the lungs every 6 (six) hours as needed for wheezing or shortness of breath. 05/20/21   [provider]  furosemide (LASIX) 40 MG tablet Take 40 mg by mouth daily as needed for fluid or edema. 05/20/21   [provider]  hydrochlorothiazide (HYDRODIURIL) 25 MG tablet Take 12.5 mg by mouth daily.    [provider]  ibuprofen (ADVIL) 200 MG tablet Take 200 mg  by mouth every 6 (six) hours as needed for headache or moderate pain.    [provider]  methadone (DOLOPHINE) 10 MG/ML solution Take 100 mg by mouth See admin instructions. Monday,Wednesday,saturday    [provider]  OVER THE COUNTER MEDICATION Take 1 capsule by mouth daily. Focus factor    [provider]  predniSONE (DELTASONE) 20 MG tablet Take 2 tablets (40 mg total) by mouth daily. Patient not taking: Reported on 06/04/2021 06/03/21   [name], MD  valACYclovir (VALTREX) 1000 MG tablet Take 1,000 mg by mouth 2 (two) times daily. 04/04/21   [provider]  potassium chloride SA (K-DUR,KLOR-CON) 20 MEQ tablet Take 1 tablet (20 mEq total) by mouth 2 (two) times daily as needed (with lasix). Patient not taking: Reported on 12/02/2018 08/02/17 12/02/18  [name], FNP      Allergies    Patient has no known allergies.    Review of Systems   Review of Systems  Respiratory:  Positive for shortness of breath.    Physical Exam Updated Vital Signs BP 130/66   Pulse 89   Temp 98.6 F (37 C) (Oral)   Resp 13   SpO2 93%  Physical Exam Vitals and nursing note reviewed.  Constitutional:      General: She is not in acute distress.    Appearance: She is ill-appearing.  HENT:     Head: Atraumatic.  Eyes:     Conjunctiva/sclera: Conjunctivae normal.  Cardiovascular:     Rate and Rhythm: Normal rate and regular  rhythm.     Pulses: Normal pulses.     Heart sounds: No murmur heard. Pulmonary:     Effort: Pulmonary effort is normal. No respiratory distress.     Breath sounds: Wheezing present.     Comments: Audible biphasic wheezing when entering exam room, increased work of breathing.  Able to speak in full and complete sentences.  No rales or rhonchi auscultated Abdominal:     General: Abdomen is flat. There is no distension.     Palpations: Abdomen is soft.     Tenderness: There is no abdominal tenderness.  Musculoskeletal:        General:  Normal range of motion.     Cervical back: Normal range of motion.  Skin:    General: Skin is warm and dry.     Capillary Refill: Capillary refill takes less than 2 seconds.  Neurological:     General: No focal deficit present.     Mental Status: She is alert.  Psychiatric:        Mood and Affect: Mood normal.    ED Results / Procedures / Treatments   Labs (all labs ordered are listed, but only abnormal results are displayed) Labs Reviewed  BASIC METABOLIC PANEL - Abnormal; Notable for the following components:      Result Value   Potassium 5.2 (*)    Glucose, Bld 126 (*)    All other components within normal limits  RESP PANEL BY RT-PCR (FLU A&B, COVID) ARPGX2  GROUP A STREP BY PCR  CBC  RAPID URINE DRUG SCREEN, HOSP PERFORMED  HIV ANTIBODY (ROUTINE TESTING W REFLEX)  TSH  I-STAT BETA HCG BLOOD, ED ([facility], [facility], [facility] ONLY)  TROPONIN I (HIGH SENSITIVITY)    EKG None  Radiology DG Chest Port 1 View  Result Date: 06/08/2021 CLINICAL DATA:  Wheezing.  O2 requirement.  Shortness of breath. EXAM: PORTABLE CHEST 1 VIEW COMPARISON:  Jun 03, 2021 FINDINGS: The heart size and mediastinal contours are within normal limits. Both lungs are clear. The visualized skeletal structures are unremarkable. IMPRESSION: No active disease. Electronically Signed   By: Gerome Samavid  Williams III M.D.   On: 06/08/2021 10:44    Procedures .Critical Care Performed by: Janell Quietonklin, Abad Manard R, PA-C Authorized by: Janell Quietonklin, Emelee Rodocker R, PA-C   Critical care provider statement:    Critical care time (minutes):  30   Critical care start time:  06/08/2021 12:30 PM   Critical care end time:  06/08/2021 1:00 PM   Critical care was necessary to treat or prevent imminent or life-threatening deterioration of the following conditions:  Respiratory failure   Critical care was time spent personally by me on the following activities:  Development of treatment plan with patient or surrogate, discussions with consultants, evaluation of  patient's response to treatment, examination of patient, ordering and review of laboratory studies, ordering and review of radiographic studies, ordering and performing treatments and interventions, pulse oximetry, re-evaluation of patient's condition and review of old charts   I assumed direction of critical care for this patient from another provider in my specialty: no     Care discussed with: admitting provider      Medications Ordered in ED Medications  ibuprofen (ADVIL) tablet 200 mg (has no administration in time range)  hydrochlorothiazide (HYDRODIURIL) tablet 12.5 mg (has no administration in time range)  enoxaparin (LOVENOX) injection 40 mg (has no administration in time range)  fluticasone furoate-vilanterol (BREO ELLIPTA) 100-25 MCG/ACT 1 puff (has no administration in time range)  ipratropium-albuterol (DUONEB) 0.5-2.5 (3) MG/3ML nebulizer solution 3 mL (has no administration in time range)  methylPREDNISolone sodium succinate (SOLU-MEDROL) 40 mg/mL injection 40 mg (has no administration in time range)  budesonide (PULMICORT) nebulizer solution 0.25 mg (has no administration in time range)  albuterol (PROVENTIL) (2.5 MG/3ML) 0.083% nebulizer solution 2.5 mg (has no administration in time range)  methadone (DOLOPHINE) 10 MG/ML solution 100 mg (has no administration in time range)  methylPREDNISolone sodium succinate (SOLU-MEDROL) 125 mg/2 mL injection 125 mg (125 mg Intravenous Given 06/08/21 1059)  magnesium sulfate IVPB 2 g 50 mL (0 g Intravenous Stopped 06/08/21 1141)  ipratropium (ATROVENT) nebulizer solution 1 mg (1 mg Nebulization Given 06/08/21 1052)  albuterol (PROVENTIL,VENTOLIN) solution continuous neb (10 mg/hr Nebulization Given 06/08/21 1052)    ED Course/ Medical Decision Making/ A&P                           Medical Decision Making Amount and/or Complexity of Data Reviewed Labs: ordered. Radiology: ordered.  Risk Decision regarding hospitalization.   Social  determinants of health: advised to d/c vaping and smoking Social History   Socioeconomic History   Marital status: Widowed    Spouse name: Not on file   Number of children: Not on file   Years of education: Not on file   Highest education level: Not on file  Occupational History   Not on file  Tobacco Use   Smoking status: Every Day    Packs/day: 0.50    Types: Cigarettes   Smokeless tobacco: Never  Vaping Use   Vaping Use: Every day  Substance and Sexual Activity   Alcohol use: No   Drug use: Not Currently    Comment: xanax   Sexual activity: Not on file  Other Topics Concern   Not on file  Social History Narrative   ** Merged History Encounter **       Social Determinants of Health   Financial Resource Strain: Not on file  Food Insecurity: Not on file  Transportation Needs: Not on file  Physical Activity: Not on file  Stress: Not on file  Social Connections: Not on file  Intimate Partner Violence: Not on file     Initial impression:  This patient presents to the ED for concern of wheezing and shortness of breath, this involves an extensive number of treatment options, and is a complaint that carries with it a high risk of complications and morbidity.   Differentials include asthma exacerbation, URI, pneumonia, ACS, strep.   Comorbidities affecting care:  Substance abuse  Additional history obtained: Urgent care records, recent labs and imaging from the ED visit 06/03/2021  Lab Tests  I Ordered, reviewed, and interpreted labs and EKG.  The pertinent results include:  Potassium 5.2 CBC without leukocytosis, normal troponin  Imaging Studies ordered:  I ordered imaging studies including  Portable chest x-ray without acute findings I independently visualized and interpreted imaging and I agree with the radiologist interpretation.   EKG: Sinus rhythm with PVCs  Cardiac Monitoring:  The patient was maintained on a cardiac monitor.  I personally viewed and  interpreted the cardiac monitored which showed an underlying rhythm of: Sinus rhythm with PVCs   Medicines ordered and prescription drug management:  I ordered medication including: Albuterol continuous nebulizer at 10 mics per hour Magnesium 2 g Atrovent 1 mg Solu-Medrol 125 mg Reevaluation of the patient after these medicines showed that the patient stayed the same I have  reviewed the patients home medicines and have made adjustments as needed   Critical Interventions:  Respiratory care as described above  Consultations Obtained:  I requested consultation with Dr. Chipper Herb,  and discussed lab and imaging findings as well as pertinent plan - He agrees to admit patient   ED Course/Re-evaluation: 51 year old female presents to the ED for evaluation of shortness of breath and wheezing despite recent burst of steroids and treatment 5 days ago.  On exam, she appears acutely distressed with audible biphasic wheezing heard upon entering the room with increased work of breathing.  Patient was tachycardic and noted to be at 88% on room air when she first arrived at triage.  She was placed on 2 L O2 via nasal cannula with improvement steadily increasing up to about 95%.  I ordered labs which showed no evidence of leukocytosis or electrolyte abnormality.  Troponin was normal.  She was complaining about a sore throat given the longevity of her symptoms, I ordered strep and respiratory panel.  Patient was given 125 mg Solu-Medrol, 2 g magnesium and a continuous albuterol treatment with Atrovent 1 mg.  After this was completed, we trialed removing oxygen supplementation and she desatted down to 85% and became acutely short of breath.  She was placed back on at 6 L O2 nasal cannula initially and then titrated back down to 2 L, maintaining oxygen at 93% or over.  At this point, patient has received extensive treatment for her respiratory illness and is not responding appropriately and requires admission.  I  spoke with Dr. Chipper Herb who agrees to admit patient.  Strep and respiratory panel are still pending.  Disposition:  After consideration of the diagnostic results, physical exam, history and the patients response to treatment feel that the patent would benefit from admission.   Hypoxia Asthma exacerbation: Plan and management as described above.   Final Clinical Impression(s) / ED Diagnoses Final diagnoses:  Hypoxia  Moderate persistent asthma with exacerbation    Rx / DC Orders ED Discharge Orders     None         Janell Quiet, PA-C 06/08/21 1340    Margarita Grizzle, MD 06/08/21 [phone]

## 2021-06-08 NOTE — H&P (Addendum)
History and Physical    [name] BPZ:[id number] DOB: Dec 25, 1970 DOA: 06/08/2021  PCP: [name], NP (Confirm with patient/family/NH records and if not entered, this has to be entered at [facility] point of entry) Patient coming from: Home  I have personally briefly reviewed patient's old medical records in [facility]  Chief Complaint: Cough, wheezing, SOB  HPI: Christy Barnett is a 51 y.o. 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  At baseline, patient uses as needed albuterol nebulizer for asthma.  Starting 1 week ago, she developed a new cough, dry, denies any fever chills no chest pains.  Soon she developed wheezing and shortness of breath and she came to ED 5 days ago, was diagnosed with acute asthma exacerbation, and she was given IV Solu-Medrol magnesium and breathing treatment and her symptoms stabilized.  She was sent home with p.o. steroid for 5 days which and completed yesterday.  Overall she did not feel significant improvement and last night, she started to feel worsening of wheezing and cough shortness of breath and came back to ED.  Again denies any fever chills no chest pains.  She stopped smoking 4 months ago.  ED Course: Patient was found hypoxic 88% on room air, chest x-ray negative for acute finding, WBC within normal limits.  Physical exam found patient had diffuse wheezing, patient was given IV Solu-Medrol 125 mg x 1 and IV magnesium x1 plus breathing treatment.  However posttreatment O2 sat ration 85% with short distance ambulation and patient felt exhausted.  Review of Systems: As per HPI otherwise 14 point review of systems negative.    Past Medical History:  Diagnosis Date   Asthma    Bronchitis    Hepatitis C    Heroin abuse (HCC)     Past Surgical History:   Procedure Laterality Date   CESAREAN SECTION       reports that she has been smoking cigarettes. She has been smoking an average of .5 packs per day. She has never used smokeless tobacco. She reports that she does not currently use drugs. She reports that she does not drink alcohol.  No Known Allergies  Family History  Problem Relation Age of Onset   Heart disease Father    Heart disease Paternal Grandfather    COPD Mother      Prior to Admission medications   Medication Sig Start Date End Date Taking? Authorizing Provider  albuterol (VENTOLIN HFA) 108 (90 Base) MCG/ACT inhaler Inhale 2 puffs into the lungs every 6 (six) hours as needed for wheezing or shortness of breath. 05/20/21   [provider]  furosemide (LASIX) 40 MG tablet Take 40 mg by mouth daily as needed for fluid or edema. 05/20/21   [provider]  hydrochlorothiazide (HYDRODIURIL) 25 MG tablet Take 12.5 mg by mouth daily.    [provider]  ibuprofen (ADVIL) 200 MG tablet Take 200 mg by mouth every 6 (six) hours as needed for headache or moderate pain.    [provider]  methadone (DOLOPHINE) 10 MG/ML solution Take 100 mg by mouth See admin instructions. Monday,Wednesday,saturday    [provider]  OVER THE COUNTER MEDICATION Take 1 capsule by mouth daily. Focus factor    [provider]  predniSONE (DELTASONE) 20 MG tablet Take 2 tablets (40 mg total) by mouth  daily. Patient not taking: Reported on 06/04/2021 06/03/21   [name], MD  valACYclovir (VALTREX) 1000 MG tablet Take 1,000 mg by mouth 2 (two) times daily. 04/04/21   [provider]  potassium chloride SA (K-DUR,KLOR-CON) 20 MEQ tablet Take 1 tablet (20 mEq total) by mouth 2 (two) times daily as needed (with lasix). Patient not taking: Reported on 12/02/2018 08/02/17 12/02/18  [name], FNP    Physical Exam: Vitals:   06/08/21 1055 06/08/21 1215 06/08/21 1252 06/08/21 1300  BP:  138/72   130/66  Pulse:  (!) 101 (!) 107 89  Resp:  20 (!) 28 13  Temp:      TempSrc:      SpO2: 97% 95% (!) 85% 93%    Constitutional: NAD, calm, comfortable Vitals:   06/08/21 1055 06/08/21 1215 06/08/21 1252 06/08/21 1300  BP:  138/72  130/66  Pulse:  (!) 101 (!) 107 89  Resp:  20 (!) 28 13  Temp:      TempSrc:      SpO2: 97% 95% (!) 85% 93%   Eyes: PERRL, lids and conjunctivae normal ENMT: Mucous membranes are moist. Posterior pharynx clear of any exudate or lesions.Normal dentition.  Neck: normal, supple, no masses, no thyromegaly Respiratory: Diminished breath sound bilaterally, scattered wheezing, no crackles, increasing breathing effort, talking in broken sentences.  Positive signs of using accessory muscle.   Cardiovascular: Regular rate and rhythm, no murmurs / rubs / gallops. No extremity edema. 2+ pedal pulses. No carotid bruits.  Abdomen: no tenderness, no masses palpated. No hepatosplenomegaly. Bowel sounds positive.  Musculoskeletal: no clubbing / cyanosis. No joint deformity upper and lower extremities. Good ROM, no contractures. Normal muscle tone.  Skin: no rashes, lesions, ulcers. No induration Neurologic: CN 2-12 grossly intact. Sensation intact, DTR normal. Strength 5/5 in all 4.  Psychiatric: Normal judgment and insight. Alert and oriented x 3. Normal mood.     Labs on Admission: I have personally reviewed following labs and imaging studies  CBC: Recent Labs  Lab 06/03/21 2155 06/08/21 1016  WBC 8.4 9.9  NEUTROABS 6.8  --   HGB 13.4 13.7  HCT 41.3 42.2  MCV 90.4 89.6  PLT 288 302   Basic Metabolic Panel: Recent Labs  Lab 06/03/21 2155 06/08/21 1016  NA 141 139  K 4.1 5.2*  CL 103 102  CO2 30 32  GLUCOSE 100* 126*  BUN 12 15  CREATININE 0.82 0.90  CALCIUM 9.5 9.2   GFR: Estimated Creatinine Clearance: 77.4 mL/min (by C-G formula based on SCr of 0.9 mg/dL). Liver Function Tests: No results for input(s): AST, ALT, ALKPHOS, BILITOT, PROT, ALBUMIN  in the last 168 hours. No results for input(s): LIPASE, AMYLASE in the last 168 hours. No results for input(s): AMMONIA in the last 168 hours. Coagulation Profile: No results for input(s): INR, PROTIME in the last 168 hours. Cardiac Enzymes: No results for input(s): CKTOTAL, CKMB, CKMBINDEX, TROPONINI in the last 168 hours. BNP (last 3 results) No results for input(s): PROBNP in the last 8760 hours. HbA1C: No results for input(s): HGBA1C in the last 72 hours. CBG: No results for input(s): GLUCAP in the last 168 hours. Lipid Profile: No results for input(s): CHOL, HDL, LDLCALC, TRIG, CHOLHDL, LDLDIRECT in the last 72 hours. Thyroid Function Tests: No results for input(s): TSH, T4TOTAL, FREET4, T3FREE, THYROIDAB in the last 72 hours. Anemia Panel: No results for input(s): VITAMINB12, FOLATE, FERRITIN, TIBC, IRON, RETICCTPCT in the last 72 hours. Urine analysis:  Component Value Date/Time   COLORURINE YELLOW 11/23/2016 1955   APPEARANCEUR HAZY (A) 11/23/2016 1955   LABSPEC 1.040 (H) 11/23/2016 1955   PHURINE 6.0 11/23/2016 1955   GLUCOSEU NEGATIVE 11/23/2016 1955   HGBUR NEGATIVE 11/23/2016 1955   BILIRUBINUR neg 07/16/2017 1227   KETONESUR NEGATIVE 11/23/2016 1955   PROTEINUR Negative 07/16/2017 1227   PROTEINUR NEGATIVE 11/23/2016 1955   UROBILINOGEN 1.0 07/16/2017 1227   UROBILINOGEN 1.0 09/17/2010 0254   NITRITE neg 07/16/2017 1227   NITRITE NEGATIVE 11/23/2016 1955   LEUKOCYTESUR Negative 07/16/2017 1227    Radiological Exams on Admission: DG Chest Port 1 View  Result Date: 06/08/2021 CLINICAL DATA:  Wheezing.  O2 requirement.  Shortness of breath. EXAM: PORTABLE CHEST 1 VIEW COMPARISON:  Jun 03, 2021 FINDINGS: The heart size and mediastinal contours are within normal limits. Both lungs are clear. The visualized skeletal structures are unremarkable. IMPRESSION: No active disease. Electronically Signed   By: Gerome Samavid  Williams III M.D.   On: 06/08/2021 10:44    EKG:  Independently reviewed. Sinus, frequent PVCs  Assessment/Plan Principal Problem:   Asthma Active Problems:   Moderate asthma with acute exacerbation  (please populate well all problems here in Problem List. (For example, if patient is on BP meds at home and you resume or decide to hold them, it is a problem that needs to be her. Same for CAD, COPD, HLD and so on)  Acute asthma exacerbation -Failed outpatient management -Escalate steroids to IV Solu-Medrol 40 mg every 8 hours -LABA plus inhaled steroid -DuoNeb nebulizer p.o. as needed albuterol -Peak flow  Acute hypoxic respiratory failure -Secondary to acute asthma exacerbation, managed as above.  History of mild mitral regurgitation -Euvolemic, normal EF on Echo in 2019 -On as needed Lasix -Cardiology follow-up for repeat echocardiogram  Frequent PVCs -Asymptomatic, check TSH, telemetry monitoring x12 hours, outpatient cardiology follow-up.  HTN -Continue HCTZ  Hyperkalemia -Borderline, not on K supplement or ACEI/ARB, recheck level in AM.  Methadone therapy -Continue methadone MWF  Hepatitis C -Never treated -Educated about importance of treatment, patient agreed with outpatient GI follow-up to discuss about treatment.  DVT prophylaxis: Lovenox Code Status: Full code Family Communication: none at bedside Disposition Plan: Sick, failed outpatient management, expect more than 2 midnight hospital stay Consults called: None Admission status: Medsurg obs   Emeline GeneralPing T Durelle Zepeda MD [facility] Pager [phone]  06/08/2021, 1:16 PM

## 2021-06-08 NOTE — Progress Notes (Signed)
NEW ADMISSION NOTE New Admission Note:   Arrival Method: Patient arrived from ED. Mental Orientation: alert &oriented x4 Telemetry: 5M09 Assessment: Completed Skin:  warm, dry and intact. IV: L FA SL Pain: denies any pain Tubes: External urinary cath Safety Measures: Safety Fall Prevention Plan has been given, discussed and signed Admission: Completed [facility] Orientation: Patient has been orientated to the room, unit and staff.  Family:  None  Orders have been reviewed and implemented. Will continue to monitor the patient. Call light has been placed within reach and bed alarm has been activated.   Amaryllis Dyke, RN

## 2021-06-09 LAB — POTASSIUM: Potassium: 4.4 mmol/L (ref 3.5–5.1)

## 2021-06-09 MED ORDER — GUAIFENESIN-DM 100-10 MG/5ML PO SYRP
5.0000 mL | ORAL_SOLUTION | ORAL | Status: DC | PRN
  Administered 2021-06-09 (×2): 5 mL via ORAL
  Filled 2021-06-09 (×2): qty 5

## 2021-06-09 MED ORDER — METHYLPREDNISOLONE SODIUM SUCC 125 MG IJ SOLR
120.0000 mg | Freq: Two times a day (BID) | INTRAMUSCULAR | Status: DC
  Administered 2021-06-09 – 2021-06-10 (×3): 120 mg via INTRAVENOUS
  Filled 2021-06-09 (×3): qty 2

## 2021-06-09 MED ORDER — METHYLPREDNISOLONE SODIUM SUCC 125 MG IJ SOLR: 60.0000 mg | Freq: Four times a day (QID) | INTRAMUSCULAR | Status: DC

## 2021-06-09 NOTE — Plan of Care (Signed)
  Problem: Education: Goal: Knowledge of disease or condition will improve Outcome: Progressing   Problem: Education: Goal: Knowledge of the prescribed therapeutic regimen will improve Outcome: Progressing   Problem: Activity: Goal: Ability to tolerate increased activity will improve Outcome: Progressing   Problem: Respiratory: Goal: Ability to maintain a clear airway will improve Outcome: Progressing   Problem: Respiratory: Goal: Levels of oxygenation will improve Outcome: Progressing   Problem: Respiratory: Goal: Ability to maintain adequate ventilation will improve Outcome: Progressing

## 2021-06-09 NOTE — TOC Initial Note (Signed)
Transition of Care [facility]) - Initial/Assessment Note    Patient Details  Name: Christy Barnett MRN: [id number] Date of Birth: October 14, 1970  Transition of [facility]) CM/SW Contact:    Kermit Balo, RN Phone Number: 06/09/2021, 1:50 PM  Clinical Narrative:                 Patient is from home with her daughter. She states her daughter is home with her most of the time. She has no DME at home.  She goes to the [facility] and sees Heide Spark for her PCP and [facility]. She states the [facility] assists with transportation to her appointments.  Pt only has [employer]. CM has sent email to financial counseling to see if the qualifies for medicaid.  TOC following for home oxygen needs. Pt also inquired about assistance with the steroid inhaler she is taking at home.    Expected Discharge Plan: Home/Self Care Barriers to Discharge: Continued Medical Work up   Patient Goals and CMS Choice     Choice offered to / list presented to : Patient  Expected Discharge Plan and Services Expected Discharge Plan: Home/Self Care   Discharge Planning Services: CM Consult Post Acute Care Choice: Durable Medical Equipment Living arrangements for the past 2 months: Apartment                                      Prior Living Arrangements/Services Living arrangements for the past 2 months: Apartment Lives with:: Adult Children Patient language and need for interpreter reviewed:: Yes Do you feel safe going back to the place where you live?: Yes            Criminal Activity/Legal Involvement Pertinent to Current Situation/Hospitalization: No - Comment as needed  Activities of Daily Living Home Assistive Devices/Equipment: None    Permission Sought/Granted                  Emotional Assessment Appearance:: Appears stated age Attitude/Demeanor/Rapport: Engaged Affect (typically observed): Accepting Orientation: : Oriented to Self,  Oriented to Place, Oriented to  Time, Oriented to Situation   Psych Involvement: No (comment)  Admission diagnosis:  Hypoxia [R09.02] Moderate persistent asthma with exacerbation [J45.41] Asthma [J45.909] Patient Active Problem List   Diagnosis Date Noted   Moderate asthma with acute exacerbation 06/08/2021   Asthma 06/08/2021   Opioid abuse with opioid-induced mood disorder (HCC) 10/30/2017   Bilateral lower extremity edema 07/16/2017   Heart murmur 07/16/2017   Bipolar affective disorder in remission (HCC) 07/16/2017   PTSD (post-traumatic stress disorder) 07/16/2017   History of substance abuse (HCC) 07/16/2017   Family history of DVT 07/16/2017   Anemia 07/16/2017   PCP:  [name], NP Pharmacy:   [facility] [phone] - [place], [place] - [address] [place] [address] Phone: [phone] Fax: [phone]     Social Determinants of Health (SDOH) Interventions    Readmission Risk Interventions     View : No data to display.

## 2021-06-09 NOTE — Progress Notes (Signed)
Methadone Dosing:  5/22: [facility] [phone], M-F [phone]) Daily dose prescribed as methadone 100mg  daily Per clinic [facility], patient unable to come into clinic daily, instead comes in ~3 times weekly +/- additional dose to take-home for weekend Average doses over past 4 weeks have been 3-4 doses weekly  Decision made to give 100mg  M/W/F while in hospital  , PharmD, BCPS Please see amion for complete clinical pharmacist phone list 06/09/2021 2:55 PM

## 2021-06-09 NOTE — Progress Notes (Signed)
PROGRESS NOTE  Christy Barnett  DOB: December 06, 1970  PCP: Christy Sharps, NP GEX:[id number]  DOA: 06/08/2021  LOS: 1 day  Hospital Day: 2  Brief narrative: Christy Barnett is a 50 y.o. 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 Patient presented to the ED on 5/21 with complaint of shortness of breath, dry cough and wheezing, progressively worsening for a week.. Patient uses albuterol as needed for asthma. She was seen in the ED 5 days ago for same symptoms, diagnosed with acute asthma exacerbation, given IV Solu-Medrol, magnesium and breathing treatments after which her symptoms improved and she was discharged on a course of 5-day of steroid which she completed.  She felt slight improvement in her symptoms but after completing steroids, symptoms flared up again and hence he presented to the ED. Reports quitting smoking 4 months ago. In the ED, patient was afebrile, heart rate in 70s, blood pressure 120s, initial oxygen saturation was 88% on room air. Chest x-ray unremarkable Labs with potassium elevated to 5.2, CBC unremarkable On exam, patient was found to have diffuse wheezing. 1 dose of Solu-Medrol 125 mg, magnesium IV and breathing treatment given Oxygen saturations failed to improve despite treatment and hence patient was admitted to hospitalist service.  Subjective: Patient was seen and examined this morning.  Pleasant middle-aged Caucasian female.  Propped up in bed.  On 4 L oxygen nasal cannula.  Still seems to be wheezing. Chart reviewed Repeat labs this morning with potassium level improved to 4.4.  Principal Problem:   Asthma Active Problems:   Moderate asthma with acute exacerbation    Assessment and Plan: Acute asthma exacerbation  Acute hypoxic respiratory failure -On as needed control as an outpatient.  Presented with worsening symptoms for a week despite completing  course of steroids. -Currently on IV Solu-Medrol 40 mg 3 times daily, Pulmicort nebulization, Breo Ellipta inhaler, DuoNeb nebulization. -Because of the severity of her symptoms, I will increase Solu-Medrol to 60 mg 4 times daily.  Start tapering based on clinical improvement. -Continue to monitor peak flow. -On 4 L oxygen by nasal cannula this morning.  Continue to wean down as tolerated.  Continue to monitor for oxygen requirement. -Patient says she stopped smoking 4 months ago.    Hyperkalemia -Potassium level on admission was elevated to 5.2.  Repeat labs this morning showed normal potassium level. Recent Labs  Lab 06/03/21 2155 06/08/21 1016 06/09/21 0323  K 4.1 5.2* 4.4   History of IV heroin abuse -Continue methadone MWF treatment.   Chronic Hepatitis C -Never treated -Educated about importance of treatment, patient agreed with outpatient GI follow-up to discuss about treatment.  No medical insurance -Patient would like medication assistance.  TOC consulted.  Goals of care   Code Status: Full Code    Mobility: Encourage ambulation  Skin assessment:     Nutritional status:  Body mass index is 36.45 kg/m.          Diet:  Diet Order             Diet Heart Room service appropriate? Yes; Fluid consistency: Thin  Diet effective now                   DVT prophylaxis:  enoxaparin (LOVENOX) injection 40 mg Start: 06/08/21 1330   Antimicrobials: None Fluid: None Consultants: None Family Communication: None at bedside  Status is: Inpatient  Continue in-hospital care because: Still wheezing, needs  IV steroids, oxygen supplementation Level of care: Med-Surg   Dispo: The patient is from: Home              Anticipated d/c is to: Hopefully home in 2 to 3 days              Patient currently is not medically stable to d/c.   Difficult to place patient No     Infusions:    Scheduled Meds:  budesonide (PULMICORT) nebulizer solution  0.25 mg  Nebulization BID   enoxaparin (LOVENOX) injection  40 mg Subcutaneous Q24H   fluticasone furoate-vilanterol  1 puff Inhalation Daily   ipratropium-albuterol  3 mL Nebulization Q6H   mouth rinse  15 mL Mouth Rinse BID   methadone  100 mg Oral Once per day on Mon Wed Fri   methylPREDNISolone (SOLU-MEDROL) injection  60 mg Intravenous Q6H   nicotine  14 mg Transdermal Daily    PRN meds: albuterol, guaiFENesin-dextromethorphan, ibuprofen   Antimicrobials: Anti-infectives (From admission, onward)    None       Objective: Vitals:   06/09/21 0808 06/09/21 0916  BP:  (!) 141/85  Pulse:  88  Resp:  16  Temp:  98.7 F (37.1 C)  SpO2: 94% 93%    Intake/Output Summary (Last 24 hours) at 06/09/2021 1158 Last data filed at 06/09/2021 0005 Gross per 24 hour  Intake 480 ml  Output --  Net 480 ml   Filed Weights   06/08/21 1506  Weight: 87.5 kg   Weight change:  Body mass index is 36.45 kg/m.   Physical Exam: General exam: Pleasant, middle-aged female.  Mild respiratory distress Skin: No rashes, lesions or ulcers. HEENT: Atraumatic, normocephalic, no obvious bleeding Lungs: Diminished bilaterally with wheezing, cough and deep breathing  CVS: regular rate and rhythm, no murmur GI/Abd: Soft, nontender, nondistended, bowel sound present CNS: Alert, awake, oriented x3 Psychiatry: Mood appropriate Extremities: No pedal edema, no calf tenderness  Data Review: I have personally reviewed the laboratory data and studies available.  F/u labs  Unresulted Labs (From admission, onward)     Start     Ordered   06/10/21 0500  CBC with Differential/Platelet  Tomorrow morning,   R        06/09/21 1158   Unscheduled  Basic metabolic panel  Tomorrow morning,   R        06/09/21 1158            Signed, Lorin Glass, MD [facility] 06/09/2021

## 2021-06-10 DIAGNOSIS — E875 Hyperkalemia: Secondary | ICD-10-CM

## 2021-06-10 DIAGNOSIS — J9601 Acute respiratory failure with hypoxia: Secondary | ICD-10-CM

## 2021-06-10 LAB — CBC WITH DIFFERENTIAL/PLATELET
Abs Immature Granulocytes: 0.1 10*3/uL — ABNORMAL HIGH (ref 0.00–0.07)
Basophils Absolute: 0 10*3/uL (ref 0.0–0.1)
Basophils Relative: 0 %
Eosinophils Absolute: 0 10*3/uL (ref 0.0–0.5)
Eosinophils Relative: 0 %
HCT: 41.1 % (ref 36.0–46.0)
Hemoglobin: 13.2 g/dL (ref 12.0–15.0)
Immature Granulocytes: 1 %
Lymphocytes Relative: 8 %
Lymphs Abs: 1.1 10*3/uL (ref 0.7–4.0)
MCH: 28.8 pg (ref 26.0–34.0)
MCHC: 32.1 g/dL (ref 30.0–36.0)
MCV: 89.5 fL (ref 80.0–100.0)
Monocytes Absolute: 0.4 10*3/uL (ref 0.1–1.0)
Monocytes Relative: 3 %
Neutro Abs: 12.2 10*3/uL — ABNORMAL HIGH (ref 1.7–7.7)
Neutrophils Relative %: 88 %
Platelets: 307 10*3/uL (ref 150–400)
RBC: 4.59 MIL/uL (ref 3.87–5.11)
RDW: 13.7 % (ref 11.5–15.5)
WBC: 13.7 10*3/uL — ABNORMAL HIGH (ref 4.0–10.5)
nRBC: 0 % (ref 0.0–0.2)

## 2021-06-10 LAB — BASIC METABOLIC PANEL
Anion gap: 9 (ref 5–15)
BUN: 22 mg/dL — ABNORMAL HIGH (ref 6–20)
CO2: 26 mmol/L (ref 22–32)
Calcium: 8.9 mg/dL (ref 8.9–10.3)
Chloride: 104 mmol/L (ref 98–111)
Creatinine, Ser: 0.86 mg/dL (ref 0.44–1.00)
GFR, Estimated: >60 mL/min (ref >60)
Glucose, Bld: 114 mg/dL — ABNORMAL HIGH (ref 70–99)
Potassium: 4.6 mmol/L (ref 3.5–5.1)
Sodium: 139 mmol/L (ref 135–145)

## 2021-06-10 MED ORDER — METHYLPREDNISOLONE SODIUM SUCC 125 MG IJ SOLR
80.0000 mg | Freq: Every day | INTRAMUSCULAR | Status: DC
  Administered 2021-06-11 – 2021-06-12 (×2): 80 mg via INTRAVENOUS
  Filled 2021-06-10 (×2): qty 2

## 2021-06-10 NOTE — Plan of Care (Signed)
  Problem: Activity: Goal: Ability to tolerate increased activity will improve 06/10/2021 0327 by Truman Hayward, RN Outcome: Progressing 06/09/2021 1835 by Truman Hayward, RN Outcome: Progressing   Problem: Activity: Goal: Will verbalize the importance of balancing activity with adequate rest periods 06/10/2021 0327 by Truman Hayward, RN Outcome: Progressing 06/09/2021 1835 by Volney American, Ernst Spell, RN Outcome: Progressing   Problem: Respiratory: Goal: Ability to maintain a clear airway will improve 06/10/2021 0327 by Truman Hayward, RN Outcome: Progressing 06/09/2021 1835 by Volney American, Ernst Spell, RN Outcome: Progressing   Problem: Respiratory: Goal: Levels of oxygenation will improve 06/10/2021 0327 by Truman Hayward, RN Outcome: Progressing 06/09/2021 1835 by Truman Hayward, RN Outcome: Progressing   Problem: Respiratory: Goal: Ability to maintain adequate ventilation will improve 06/10/2021 0327 by Truman Hayward, RN Outcome: Progressing 06/09/2021 1835 by Truman Hayward, RN Outcome: Progressing

## 2021-06-10 NOTE — Progress Notes (Signed)
PROGRESS NOTE    Christy Barnett  WUJ:[id number] DOB: July 27, 1970 DOA: 06/08/2021 PCP: [name], NP   Brief Narrative: Christy Barnett is a 51 y.o. 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 Patient presented to the ED on 5/21 with complaint of shortness of breath, dry cough and wheezing, progressively worsening for a week.. Patient uses albuterol as needed for asthma. She was seen in the ED 5 days ago for same symptoms, diagnosed with acute asthma exacerbation, given IV Solu-Medrol, magnesium and breathing treatments after which her symptoms improved and she was discharged on a course of 5-day of steroid which she completed.  She felt slight improvement in her symptoms but after completing steroids, symptoms flared up again and hence he presented to the ED. Reports quitting smoking 4 months ago. In the ED, patient was afebrile, heart rate in 70s, blood pressure 120s, initial oxygen saturation was 88% on room air. Chest x-ray unremarkable Labs with potassium elevated to 5.2, CBC unremarkable On exam, patient was found to have diffuse wheezing. 1 dose of Solu-Medrol 125 mg, magnesium IV and breathing treatment given Oxygen saturations failed to improve despite treatment and hence patient was admitted to hospitalist service.  Assessment and Plan:  Asthma exacerbation Unclear etiology for exacerbation. Patient is managed on albuterol monotherapy for her asthma. Patient started on IV steroids and albuterol prn for inpatient treatment. Patient also started on Breo Ellipta and Pulmicort -Continue Duonebs/Solumedrol (decrease dose to 80 mg daily)  Acute respiratory failure with hypoxia Secondary to above. Patient needing as much as 6 L/min of oxygen. -Wean to room air as able  Hyperkalemia Mild. Resolved.  History of IV heroin use Patient is currently on methadone maintenance  therapy. -Continue Methadone  Chronic hepatitis C Untreated. Patient will need to follow-up with ID clinic.  Positive RPR Concern for syphilis. On chart review, there has been concern for neurosyphilis. Patient has followed up with neurology and has been referred to infectious disease. 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   DVT prophylaxis: Lovenox Code Status:   Code Status: Full Code Family Communication: None at bedside Disposition Plan: Discharge home once able to wean oxygen off   Consultants:  None  Procedures:  None  Antimicrobials: None    Subjective: Patient reports feeling better but still having significant issues with functional capacity. Significant dyspnea especially with exertion.   Objective: BP (!) 106/52 (BP Location: Right Arm)   Pulse 94   Temp 98.6 F (37 C) (Oral)   Resp 18   Ht 5\' 1"  (1.549 m)   Wt 87.5 kg   SpO2 96%   BMI 36.45 kg/m   Examination:  General exam: Appears calm and comfortable Respiratory system: Bilateral wheezing with diminished breath sounds/air movement. Respiratory effort normal. Cardiovascular system: S1 & S2 heard, RRR. No murmurs, rubs, gallops or clicks. Gastrointestinal system: Abdomen is nondistended, soft and nontender. No organomegaly or masses felt. Normal bowel sounds heard. Central nervous system: Alert and oriented. No focal neurological deficits. Musculoskeletal: No edema. No calf tenderness Skin: No cyanosis. No rashes Psychiatry: Judgement and insight appear normal. Mood & affect appropriate.    Data Reviewed: I have personally reviewed following labs and imaging studies  CBC Lab Results  Component Value Date   WBC 13.7 (H) 06/10/2021   RBC 4.59 06/10/2021   HGB 13.2 06/10/2021  HCT 41.1 06/10/2021   MCV 89.5 06/10/2021   MCH 28.8 06/10/2021   PLT 307 06/10/2021   MCHC 32.1 06/10/2021   RDW 13.7 06/10/2021    LYMPHSABS 1.1 06/10/2021   MONOABS 0.4 06/10/2021   EOSABS 0.0 06/10/2021   BASOSABS 0.0 06/10/2021     Last metabolic panel Lab Results  Component Value Date   NA 139 06/10/2021   K 4.6 06/10/2021   CL 104 06/10/2021   CO2 26 06/10/2021   BUN 22 (H) 06/10/2021   CREATININE 0.86 06/10/2021   GLUCOSE 114 (H) 06/10/2021   GFRNONAA >60 06/10/2021   GFRAA >60 10/29/2017   CALCIUM 8.9 06/10/2021   PROT 6.8 10/29/2017   ALBUMIN 3.6 10/29/2017   LABGLOB 2.7 08/02/2017   AGRATIO 1.6 08/02/2017   BILITOT 0.4 10/29/2017   ALKPHOS 56 10/29/2017   AST 24 10/29/2017   ALT 15 10/29/2017   ANIONGAP 9 06/10/2021    GFR: Estimated Creatinine Clearance: 77.8 mL/min (by C-G formula based on SCr of 0.86 mg/dL).  Recent Results (from the past 240 hour(s))  Resp Panel by RT-PCR (Flu A&B, Covid) Nasopharyngeal Swab     Status: None   Collection Time: 06/03/21  9:53 PM   Specimen: Nasopharyngeal Swab; Nasopharyngeal(NP) swabs in vial transport medium  Result Value Ref Range Status   SARS Coronavirus 2 by RT PCR NEGATIVE NEGATIVE Final    Comment: (NOTE) SARS-CoV-2 target nucleic acids are NOT DETECTED.  The SARS-CoV-2 RNA is generally detectable in upper respiratory specimens during the acute phase of infection. The lowest concentration of SARS-CoV-2 viral copies this assay can detect is 138 copies/mL. A negative result does not preclude SARS-Cov-2 infection and should not be used as the sole basis for treatment or other patient management decisions. A negative result may occur with  improper specimen collection/handling, submission of specimen other than nasopharyngeal swab, presence of viral mutation(s) within the areas targeted by this assay, and inadequate number of viral copies(<138 copies/mL). A negative result must be combined with clinical observations, patient history, and epidemiological information. The expected result is Negative.  Fact Sheet for Patients:   BloggerCourse.comhttps://www.fda.gov/media/152166/download  Fact Sheet for Healthcare Providers:  SeriousBroker.ithttps://www.fda.gov/media/152162/download  This test is no t yet approved or cleared by the [place] FDA and  has been authorized for detection and/or diagnosis of SARS-CoV-2 by FDA under an Emergency Use Authorization (EUA). This EUA will remain  in effect (meaning this test can be used) for the duration of the COVID-19 declaration under Section 564(b)(1) of the Act, 21 U.S.C.section 360bbb-3(b)(1), unless the authorization is terminated  or revoked sooner.       Influenza A by PCR NEGATIVE NEGATIVE Final   Influenza B by PCR NEGATIVE NEGATIVE Final    Comment: (NOTE) The Xpert Xpress SARS-CoV-2/FLU/RSV plus assay is intended as an aid in the diagnosis of influenza from Nasopharyngeal swab specimens and should not be used as a sole basis for treatment. Nasal washings and aspirates are unacceptable for Xpert Xpress SARS-CoV-2/FLU/RSV testing.  Fact Sheet for Patients: BloggerCourse.comhttps://www.fda.gov/media/152166/download  Fact Sheet for Healthcare Providers: SeriousBroker.ithttps://www.fda.gov/media/152162/download  This test is not yet approved or cleared by the [place] FDA and has been authorized for detection and/or diagnosis of SARS-CoV-2 by FDA under an Emergency Use Authorization (EUA). This EUA will remain in effect (meaning this test can be used) for the duration of the COVID-19 declaration under Section 564(b)(1) of the Act, 21 U.S.C. section 360bbb-3(b)(1), unless the authorization is terminated or revoked.  Performed at [facility], [address] [place], [place] [address]   Resp Panel by RT-PCR (Flu A&B, Covid) Nasopharyngeal Swab     Status: None   Collection Time: 06/08/21 11:41 AM   Specimen: Nasopharyngeal Swab; Nasopharyngeal(NP) swabs in vial transport medium  Result Value Ref Range Status   SARS Coronavirus 2 by RT PCR NEGATIVE NEGATIVE Final    Comment: (NOTE) SARS-CoV-2  target nucleic acids are NOT DETECTED.  The SARS-CoV-2 RNA is generally detectable in upper respiratory specimens during the acute phase of infection. The lowest concentration of SARS-CoV-2 viral copies this assay can detect is 138 copies/mL. A negative result does not preclude SARS-Cov-2 infection and should not be used as the sole basis for treatment or other patient management decisions. A negative result may occur with  improper specimen collection/handling, submission of specimen other than nasopharyngeal swab, presence of viral mutation(s) within the areas targeted by this assay, and inadequate number of viral copies(<138 copies/mL). A negative result must be combined with clinical observations, patient history, and epidemiological information. The expected result is Negative.  Fact Sheet for Patients:  BloggerCourse.com  Fact Sheet for Healthcare Providers:  SeriousBroker.it  This test is no t yet approved or cleared by the [place] FDA and  has been authorized for detection and/or diagnosis of SARS-CoV-2 by FDA under an Emergency Use Authorization (EUA). This EUA will remain  in effect (meaning this test can be used) for the duration of the COVID-19 declaration under Section 564(b)(1) of the Act, 21 U.S.C.section 360bbb-3(b)(1), unless the authorization is terminated  or revoked sooner.       Influenza A by PCR NEGATIVE NEGATIVE Final   Influenza B by PCR NEGATIVE NEGATIVE Final    Comment: (NOTE) The Xpert Xpress SARS-CoV-2/FLU/RSV plus assay is intended as an aid in the diagnosis of influenza from Nasopharyngeal swab specimens and should not be used as a sole basis for treatment. Nasal washings and aspirates are unacceptable for Xpert Xpress SARS-CoV-2/FLU/RSV testing.  Fact Sheet for Patients: BloggerCourse.com  Fact Sheet for Healthcare  Providers: SeriousBroker.it  This test is not yet approved or cleared by the [place] FDA and has been authorized for detection and/or diagnosis of SARS-CoV-2 by FDA under an Emergency Use Authorization (EUA). This EUA will remain in effect (meaning this test can be used) for the duration of the COVID-19 declaration under Section 564(b)(1) of the Act, 21 U.S.C. section 360bbb-3(b)(1), unless the authorization is terminated or revoked.  Performed at [facility], [address], [place], [place] [address]   Group A Strep by PCR     Status: None   Collection Time: 06/08/21 11:41 AM   Specimen: Nasopharyngeal Swab; Sterile Swab  Result Value Ref Range Status   Group A Strep by PCR NOT DETECTED NOT DETECTED Final    Comment: Performed at [facility], [address] [place], [place] [address]      Radiology Studies: No results found.    LOS: 2 days    Jacquelin Hawking, MD [facility] 06/10/2021, 7:13 PM   If 7PM-7AM, please contact night-coverage [email or web address]

## 2021-06-11 NOTE — Progress Notes (Signed)
SATURATION QUALIFICATIONS: (This note is used to comply with regulatory documentation for home oxygen)  Patient Saturations on Room Air at Rest = 96% Pulse 96  Patient Saturations on Room Air while Ambulating = 93% Pulse 37   Patient Saturations on Room Air while Ambulating  =93 % Pulse 101

## 2021-06-11 NOTE — Progress Notes (Addendum)
PROGRESS NOTE    Christy Barnett  FVC:[id number] DOB: [birth year] DOA: 06/08/2021 PCP: [name], NP   Brief Narrative: Christy Barnett is a 51 y.o. 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 Patient presented to the ED on 5/21 with complaint of shortness of breath, dry cough and wheezing, progressively worsening for a week.. Patient uses albuterol as needed for asthma. She was seen in the ED 5 days ago for same symptoms, diagnosed with acute asthma exacerbation, given IV Solu-Medrol, magnesium and breathing treatments after which her symptoms improved and she was discharged on a course of 5-day of steroid which she completed.  She felt slight improvement in her symptoms but after completing steroids, symptoms flared up again and hence he presented to the ED. Reports quitting smoking 4 months ago. In the ED, patient was afebrile, heart rate in 70s, blood pressure 120s, initial oxygen saturation was 88% on room air. Chest x-ray unremarkable Labs with potassium elevated to 5.2, CBC unremarkable On exam, patient was found to have diffuse wheezing. 1 dose of Solu-Medrol 125 mg, magnesium IV and breathing treatment given Oxygen saturations failed to improve despite treatment and hence patient was admitted to hospitalist service.  Assessment and Plan:  Asthma exacerbation Unclear etiology for exacerbation. Patient is managed on albuterol monotherapy for her asthma. Patient started on IV steroids and albuterol prn for inpatient treatment. Patient also started on Breo Ellipta and Pulmicort -Continue Duonebs/Solumedrol (decrease dose to 80 mg daily)  Acute respiratory failure with hypoxia Secondary to above. Patient needing as much as 6 L/min of oxygen. Down to 3 L/min this morning -Wean to room air as able -Ambulatory pulse ox  Hyperkalemia Mild. Resolved.  History of IV heroin  use Patient is currently on methadone maintenance therapy. -Continue Methadone  Chronic hepatitis C Untreated. Patient will need to follow-up with ID clinic.  Positive RPR Concern for syphilis. On chart review, there has been concern for neurosyphilis. Patient has followed up with neurology and has been referred to infectious disease. 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  Obesity Body mass index is 36.45 kg/m.   DVT prophylaxis: Lovenox Code Status:   Code Status: Full Code Family Communication: None at bedside Disposition Plan: Discharge home once able to wean oxygen off and functional status improved. Likely discharge home in AM   Consultants:  None  Procedures:  None  Antimicrobials: None    Subjective: Continues to have some dyspnea but continues to feel better.  Objective: BP (!) 102/53 (BP Location: Right Arm)   Pulse 95   Temp 98.3 F (36.8 C) (Oral)   Resp 18   Ht 5\' 1"  (1.549 m)   Wt 87.5 kg   SpO2 94%   BMI 36.45 kg/m   Examination:  General exam: Appears calm and comfortable Respiratory system: Improved air movement. Some mild wheezing. Respiratory effort normal at rest. Cardiovascular system: S1 & S2 heard, RRR. No murmurs, rubs, gallops or clicks. Gastrointestinal system: Abdomen is nondistended, soft and nontender. Normal bowel sounds heard. Central nervous system: Alert and oriented. No focal neurological deficits. Musculoskeletal: No edema. No calf tenderness Skin: No cyanosis. No rashes Psychiatry: Judgement and insight appear normal. Mood & affect appropriate.     Data Reviewed: I have personally reviewed following labs and imaging studies  CBC Lab Results  Component Value Date  WBC 13.7 (H) 06/10/2021   RBC 4.59 06/10/2021   HGB 13.2 06/10/2021   HCT 41.1 06/10/2021   MCV 89.5 06/10/2021   MCH 28.8 06/10/2021   PLT 307 06/10/2021   MCHC 32.1  06/10/2021   RDW 13.7 06/10/2021   LYMPHSABS 1.1 06/10/2021   MONOABS 0.4 06/10/2021   EOSABS 0.0 06/10/2021   BASOSABS 0.0 06/10/2021     Last metabolic panel Lab Results  Component Value Date   NA 139 06/10/2021   K 4.6 06/10/2021   CL 104 06/10/2021   CO2 26 06/10/2021   BUN 22 (H) 06/10/2021   CREATININE 0.86 06/10/2021   GLUCOSE 114 (H) 06/10/2021   GFRNONAA >60 06/10/2021   GFRAA >60 10/29/2017   CALCIUM 8.9 06/10/2021   PROT 6.8 10/29/2017   ALBUMIN 3.6 10/29/2017   LABGLOB 2.7 08/02/2017   AGRATIO 1.6 08/02/2017   BILITOT 0.4 10/29/2017   ALKPHOS 56 10/29/2017   AST 24 10/29/2017   ALT 15 10/29/2017   ANIONGAP 9 06/10/2021    GFR: Estimated Creatinine Clearance: 77.8 mL/min (by C-G formula based on SCr of 0.86 mg/dL).  Recent Results (from the past 240 hour(s))  Resp Panel by RT-PCR (Flu A&B, Covid) Nasopharyngeal Swab     Status: None   Collection Time: 06/03/21  9:53 PM   Specimen: Nasopharyngeal Swab; Nasopharyngeal(NP) swabs in vial transport medium  Result Value Ref Range Status   SARS Coronavirus 2 by RT PCR NEGATIVE NEGATIVE Final    Comment: (NOTE) SARS-CoV-2 target nucleic acids are NOT DETECTED.  The SARS-CoV-2 RNA is generally detectable in upper respiratory specimens during the acute phase of infection. The lowest concentration of SARS-CoV-2 viral copies this assay can detect is 138 copies/mL. A negative result does not preclude SARS-Cov-2 infection and should not be used as the sole basis for treatment or other patient management decisions. A negative result may occur with  improper specimen collection/handling, submission of specimen other than nasopharyngeal swab, presence of viral mutation(s) within the areas targeted by this assay, and inadequate number of viral copies(<138 copies/mL). A negative result must be combined with clinical observations, patient history, and epidemiological information. The expected result is Negative.  Fact  Sheet for Patients:  BloggerCourse.comhttps://www.fda.gov/media/152166/download  Fact Sheet for Healthcare Providers:  SeriousBroker.ithttps://www.fda.gov/media/152162/download  This test is no t yet approved or cleared by the [place] FDA and  has been authorized for detection and/or diagnosis of SARS-CoV-2 by FDA under an Emergency Use Authorization (EUA). This EUA will remain  in effect (meaning this test can be used) for the duration of the COVID-19 declaration under Section 564(b)(1) of the Act, 21 U.S.C.section 360bbb-3(b)(1), unless the authorization is terminated  or revoked sooner.       Influenza A by PCR NEGATIVE NEGATIVE Final   Influenza B by PCR NEGATIVE NEGATIVE Final    Comment: (NOTE) The Xpert Xpress SARS-CoV-2/FLU/RSV plus assay is intended as an aid in the diagnosis of influenza from Nasopharyngeal swab specimens and should not be used as a sole basis for treatment. Nasal washings and aspirates are unacceptable for Xpert Xpress SARS-CoV-2/FLU/RSV testing.  Fact Sheet for Patients: BloggerCourse.comhttps://www.fda.gov/media/152166/download  Fact Sheet for Healthcare Providers: SeriousBroker.ithttps://www.fda.gov/media/152162/download  This test is not yet approved or cleared by the [place] FDA and has been authorized for detection and/or diagnosis of SARS-CoV-2 by FDA under an Emergency Use Authorization (EUA). This EUA will remain in effect (meaning this test can be used) for the duration of the COVID-19 declaration under Section 564(b)(1) of the  Act, 21 U.S.C. section 360bbb-3(b)(1), unless the authorization is terminated or revoked.  Performed at [facility], [address], [place], [place] [address]   Resp Panel by RT-PCR (Flu A&B, Covid) Nasopharyngeal Swab     Status: None   Collection Time: 06/08/21 11:41 AM   Specimen: Nasopharyngeal Swab; Nasopharyngeal(NP) swabs in vial transport medium  Result Value Ref Range Status   SARS Coronavirus 2 by RT PCR NEGATIVE NEGATIVE Final    Comment:  (NOTE) SARS-CoV-2 target nucleic acids are NOT DETECTED.  The SARS-CoV-2 RNA is generally detectable in upper respiratory specimens during the acute phase of infection. The lowest concentration of SARS-CoV-2 viral copies this assay can detect is 138 copies/mL. A negative result does not preclude SARS-Cov-2 infection and should not be used as the sole basis for treatment or other patient management decisions. A negative result may occur with  improper specimen collection/handling, submission of specimen other than nasopharyngeal swab, presence of viral mutation(s) within the areas targeted by this assay, and inadequate number of viral copies(<138 copies/mL). A negative result must be combined with clinical observations, patient history, and epidemiological information. The expected result is Negative.  Fact Sheet for Patients:  BloggerCourse.com  Fact Sheet for Healthcare Providers:  SeriousBroker.it  This test is no t yet approved or cleared by the [place] FDA and  has been authorized for detection and/or diagnosis of SARS-CoV-2 by FDA under an Emergency Use Authorization (EUA). This EUA will remain  in effect (meaning this test can be used) for the duration of the COVID-19 declaration under Section 564(b)(1) of the Act, 21 U.S.C.section 360bbb-3(b)(1), unless the authorization is terminated  or revoked sooner.       Influenza A by PCR NEGATIVE NEGATIVE Final   Influenza B by PCR NEGATIVE NEGATIVE Final    Comment: (NOTE) The Xpert Xpress SARS-CoV-2/FLU/RSV plus assay is intended as an aid in the diagnosis of influenza from Nasopharyngeal swab specimens and should not be used as a sole basis for treatment. Nasal washings and aspirates are unacceptable for Xpert Xpress SARS-CoV-2/FLU/RSV testing.  Fact Sheet for Patients: BloggerCourse.com  Fact Sheet for Healthcare  Providers: SeriousBroker.it  This test is not yet approved or cleared by the [place] FDA and has been authorized for detection and/or diagnosis of SARS-CoV-2 by FDA under an Emergency Use Authorization (EUA). This EUA will remain in effect (meaning this test can be used) for the duration of the COVID-19 declaration under Section 564(b)(1) of the Act, 21 U.S.C. section 360bbb-3(b)(1), unless the authorization is terminated or revoked.  Performed at [facility], [address] [place], [place] [address]   Group A Strep by PCR     Status: None   Collection Time: 06/08/21 11:41 AM   Specimen: Nasopharyngeal Swab; Sterile Swab  Result Value Ref Range Status   Group A Strep by PCR NOT DETECTED NOT DETECTED Final    Comment: Performed at [facility], [address], [place], [place] [address]      Radiology Studies: No results found.    LOS: 3 days    Jacquelin Hawking, MD [facility] 06/11/2021, 3:20 PM   If 7PM-7AM, please contact night-coverage [email or web address]

## 2021-06-12 ENCOUNTER — Other Ambulatory Visit (HOSPITAL_COMMUNITY): Payer: Self-pay

## 2021-06-12 DIAGNOSIS — E875 Hyperkalemia: Secondary | ICD-10-CM

## 2021-06-12 DIAGNOSIS — J9601 Acute respiratory failure with hypoxia: Secondary | ICD-10-CM

## 2021-06-12 MED ORDER — BUDESONIDE 1 MG/2ML IN SUSP
1.0000 mg | Freq: Every day | RESPIRATORY_TRACT | 2 refills | Status: DC
  Filled 2021-06-12: qty 60, 30d supply, fill #0

## 2021-06-12 MED ORDER — QVAR REDIHALER 80 MCG/ACT IN AERB
1.0000 | INHALATION_SPRAY | Freq: Two times a day (BID) | RESPIRATORY_TRACT | 2 refills | Status: DC
  Filled 2021-06-12: qty 10.6, 30d supply, fill #0
  Filled 2021-07-10: qty 10.6, 30d supply, fill #1

## 2021-06-12 MED ORDER — ALBUTEROL SULFATE (2.5 MG/3ML) 0.083% IN NEBU
INHALATION_SOLUTION | RESPIRATORY_TRACT | 2 refills | Status: DC
  Filled 2021-06-12: qty 180, 15d supply, fill #0
  Filled 2021-07-10: qty 180, fill #1

## 2021-06-12 MED ORDER — ALBUTEROL SULFATE (2.5 MG/3ML) 0.083% IN NEBU: 2.5000 mg | INHALATION_SOLUTION | RESPIRATORY_TRACT | Status: DC | PRN

## 2021-06-12 MED ORDER — PREDNISONE 20 MG PO TABS
40.0000 mg | ORAL_TABLET | Freq: Every day | ORAL | 0 refills | Status: AC
  Filled 2021-06-12: qty 6, 3d supply, fill #0

## 2021-06-12 NOTE — Discharge Summary (Signed)
Physician Discharge Summary   Patient: Christy Barnett MRN: [id number] DOB: [birth year]  Admit date:     06/08/2021  Discharge date: 06/12/21  Discharge Physician: Cordelia Poche, MD   PCP: Marliss Coots, NP   Recommendations at discharge:  PCP follow-up ID referral for hepatitis C  Discharge Diagnoses: Principal Problem:   Asthma Active Problems:   Moderate asthma with acute exacerbation  Resolved Problems:   * No resolved hospital problems. *  Admission HPI: Christy Barnett is a 51 y.o. 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   At baseline, patient uses as needed albuterol nebulizer for asthma.  Starting 1 week ago, she developed a new cough, dry, denies any fever chills no chest pains.  Soon she developed wheezing and shortness of breath and she came to ED 5 days ago, was diagnosed with acute asthma exacerbation, and she was given IV Solu-Medrol magnesium and breathing treatment and her symptoms stabilized.  She was sent home with p.o. steroid for 5 days which and completed yesterday.  Overall she did not feel significant improvement and last night, she started to feel worsening of wheezing and cough shortness of breath and came back to ED.  Again denies any fever chills no chest pains.  She stopped smoking 4 months ago.  Assessment and Plan: Asthma exacerbation Unclear etiology for exacerbation. Patient is managed on albuterol monotherapy for her asthma. Patient started on IV steroids and albuterol prn for inpatient treatment. Patient also started on Breo Ellipta and Pulmicort. Patient with significant improvement in symptoms. Patient discharged on prednisone 40 mg for 3 days. She is also discharged on QVAR for maintenance therapy. Albuterol nebulizer solution provided for use in addition to  her previously prescribed albuterol inhaler.   Acute respiratory failure with hypoxia, resolved Secondary to above. Patient needing as much as 6 L/min of oxygen. Patient weaned to room air. Not requiring oxygen with ambulation.   Hyperkalemia Mild. Resolved.   History of IV heroin use Patient is currently on methadone maintenance therapy. Continue Methadone   Chronic hepatitis C Untreated. Patient will need to follow-up with ID clinic. Referral placed.   Positive RPR Concern for syphilis. On chart review, there has been concern for neurosyphilis. Patient has followed up with neurology and has been referred to infectious disease. 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   Obesity Body mass index is 36.45 kg/m.    Consultants: None Procedures performed: None  Disposition: Home Diet recommendation:  Regular diet  DISCHARGE MEDICATION: Allergies as of 06/12/2021   No Known Allergies      Medication List     STOP taking these medications    amoxicillin 500 MG tablet Commonly known as: AMOXIL   hydrochlorothiazide 25 MG tablet Commonly known as: HYDRODIURIL       TAKE these medications    albuterol 108 (90 Base) MCG/ACT inhaler Commonly known as: VENTOLIN HFA Inhale 2 puffs into the lungs every 6 (six) hours as needed for wheezing or shortness of breath. What changed: Another medication with the same name was added. Make sure you understand how and when to take each.   albuterol (2.5 MG/3ML) 0.083% nebulizer solution Commonly known as: PROVENTIL Use 1 vial (2.5 mg total) by nebulization 4 (four) times daily for 3  days, THEN 1 vial (2.5 mg total) every 4 (four) hours as needed for wheezing or shortness of breath. Start taking on: Jun 12, 2021 What changed: You were already taking a medication with the same name, and this prescription was added. Make sure you understand how and when to  take each.   furosemide 40 MG tablet Commonly known as: LASIX Take 40 mg by mouth daily as needed for fluid or edema.   gabapentin 100 MG capsule Commonly known as: NEURONTIN Take 100 mg by mouth 3 (three) times daily.   ibuprofen 200 MG tablet Commonly known as: ADVIL Take 200 mg by mouth daily as needed for headache (pain).   METHADONE HCL PO Take 100 mg by mouth See admin instructions. Take 100 mg by mouth four times weekly.   OVER THE COUNTER MEDICATION Take 1 capsule by mouth every morning. Focus factor   predniSONE 20 MG tablet Commonly known as: DELTASONE Take 2 tablets (40 mg total) by mouth daily for 3 days. Start taking on: Jun 13, 2021   Qvar RediHaler 80 MCG/ACT inhaler Generic drug: beclomethasone Inhale 1 puff into the lungs 2 (two) times daily. Start taking on: Jun 16, 2021   valACYclovir 500 MG tablet Commonly known as: VALTREX Take 500 mg by mouth every morning.        Follow-up Information     Placey, Audrea Muscat, NP. Call.   Contact information: [address] [place] [address] [phone]                Discharge Exam: BP 110/63   Pulse 70   Temp 98.6 F (37 C)   Resp 17   Ht 5\' 1"  (1.549 m)   Wt 87.5 kg   SpO2 99%   BMI 36.45 kg/m   General exam: Appears calm and comfortable Respiratory system: Clear to auscultation. Respiratory effort normal. Cardiovascular system: S1 & S2 heard, RRR. No murmurs, rubs, gallops or clicks. Gastrointestinal system: Abdomen is nondistended, soft and nontender. Normal bowel sounds heard. Central nervous system: Alert and oriented. No focal neurological deficits. Musculoskeletal: No edema. No calf tenderness Skin: No cyanosis. No rashes Psychiatry: Judgement and insight appear normal. Mood & affect appropriate.   Condition at discharge: stable  The results of significant diagnostics from this hospitalization (including imaging, microbiology, ancillary and laboratory) are listed below  for reference.   Imaging Studies: DG Chest 2 View  Result Date: 06/03/2021 CLINICAL DATA:  Cough and shortness of breath EXAM: CHEST - 2 VIEW COMPARISON:  05/26/2021 FINDINGS: The heart size and mediastinal contours are within normal limits. Both lungs are clear. The visualized skeletal structures are unremarkable. IMPRESSION: No acute abnormality noted. Electronically Signed   By: Inez Catalina M.D.   On: 06/03/2021 22:15   DG Chest Port 1 View  Result Date: 06/08/2021 CLINICAL DATA:  Wheezing.  O2 requirement.  Shortness of breath. EXAM: PORTABLE CHEST 1 VIEW COMPARISON:  Jun 03, 2021 FINDINGS: The heart size and mediastinal contours are within normal limits. Both lungs are clear. The visualized skeletal structures are unremarkable. IMPRESSION: No active disease. Electronically Signed   By: Dorise Bullion III M.D.   On: 06/08/2021 10:44    Microbiology: Results for orders placed or performed during the hospital encounter of 06/08/21  Resp Panel by RT-PCR (Flu A&B, Covid) Nasopharyngeal Swab     Status: None   Collection Time: 06/08/21 11:41 AM   Specimen: Nasopharyngeal Swab; Nasopharyngeal(NP) swabs in vial transport medium  Result Value Ref  Range Status   SARS Coronavirus 2 by RT PCR NEGATIVE NEGATIVE Final    Comment: (NOTE) SARS-CoV-2 target nucleic acids are NOT DETECTED.  The SARS-CoV-2 RNA is generally detectable in upper respiratory specimens during the acute phase of infection. The lowest concentration of SARS-CoV-2 viral copies this assay can detect is 138 copies/mL. A negative result does not preclude SARS-Cov-2 infection and should not be used as the sole basis for treatment or other patient management decisions. A negative result may occur with  improper specimen collection/handling, submission of specimen other than nasopharyngeal swab, presence of viral mutation(s) within the areas targeted by this assay, and inadequate number of viral copies(<138 copies/mL). A negative  result must be combined with clinical observations, patient history, and epidemiological information. The expected result is Negative.  Fact Sheet for Patients:  EntrepreneurPulse.com.au  Fact Sheet for Healthcare Providers:  IncredibleEmployment.be  This test is no t yet approved or cleared by the [place] FDA and  has been authorized for detection and/or diagnosis of SARS-CoV-2 by FDA under an Emergency Use Authorization (EUA). This EUA will remain  in effect (meaning this test can be used) for the duration of the COVID-19 declaration under Section 564(b)(1) of the Act, 21 U.S.C.section 360bbb-3(b)(1), unless the authorization is terminated  or revoked sooner.       Influenza A by PCR NEGATIVE NEGATIVE Final   Influenza B by PCR NEGATIVE NEGATIVE Final    Comment: (NOTE) The Xpert Xpress SARS-CoV-2/FLU/RSV plus assay is intended as an aid in the diagnosis of influenza from Nasopharyngeal swab specimens and should not be used as a sole basis for treatment. Nasal washings and aspirates are unacceptable for Xpert Xpress SARS-CoV-2/FLU/RSV testing.  Fact Sheet for Patients: EntrepreneurPulse.com.au  Fact Sheet for Healthcare Providers: IncredibleEmployment.be  This test is not yet approved or cleared by the [place] FDA and has been authorized for detection and/or diagnosis of SARS-CoV-2 by FDA under an Emergency Use Authorization (EUA). This EUA will remain in effect (meaning this test can be used) for the duration of the COVID-19 declaration under Section 564(b)(1) of the Act, 21 U.S.C. section 360bbb-3(b)(1), unless the authorization is terminated or revoked.  Performed at [facility], [place] [address] [place], [place] [address]   Group A Strep by PCR     Status: None   Collection Time: 06/08/21 11:41 AM   Specimen: Nasopharyngeal Swab; Sterile Swab  Result Value Ref Range Status    Group A Strep by PCR NOT DETECTED NOT DETECTED Final    Comment: Performed at [facility], [place] [address], [place], [place] [address]    Labs: CBC: Recent Labs  Lab 06/08/21 1016 06/10/21 0324  WBC 9.9 13.7*  NEUTROABS  --  12.2*  HGB 13.7 13.2  HCT 42.2 41.1  MCV 89.6 89.5  PLT 302 [id number]   Basic Metabolic Panel: Recent Labs  Lab 06/08/21 1016 06/09/21 0323 06/10/21 0324  NA 139  --  139  K 5.2* 4.4 4.6  CL 102  --  104  CO2 32  --  26  GLUCOSE 126*  --  114*  BUN 15  --  22*  CREATININE 0.90  --  0.86  CALCIUM 9.2  --  8.9    Discharge time spent: 35 minutes.  Signed: Cordelia Poche, MD [facility] 06/12/2021

## 2021-06-12 NOTE — Discharge Instructions (Signed)
Christy Barnett,  You were in the hospital with an asthma exacerbation. This has improved with treatment, thankfully. You will discharge and continue your Albuterol. You are also discharging with new medication for oral and inhaled steroids. Please follow-up with your PCP. I have also sent a referral to the infectious disease clinic for your hepatitis C.

## 2021-06-12 NOTE — TOC Benefit Eligibility Note (Signed)
Transition of Care [facility]) Benefit Eligibility Note    Patient Details  Name: Christy Barnett MRN: [id number] Date of Birth: 1970/10/31   Medication/Dose: PREFERRED: QVAR  INHALER     and      BECLOMETHASONE DIPROPIONATE BUDEAONIDE INHALER: NON-FORMULARY  Covered?: Yes  Tier: 3 Drug  Prescription Coverage Pref[facility], [facility], [facility] , [facility] , [facility] , [facility]  Spoke with Person/Company/Phone Number:: JAKE  @   [facility] #  [phone]  Co-Pay: $15.00  Prior Approval: No  Deductible:  (NO DEDUCTIBLE WITH PLAN)  Additional Notes: FLUTICASONE PROPIONATE MOMETASONE INHALER: COVER- YES , CO-PAY-ZERO DOLLARS and ONE NASAL SPRAY PER MO, TIER- 1 DRUG , PRIOR APPROVAL- NO    Mardene Sayer Phone Number: 06/12/2021, 12:56 PM

## 2021-06-12 NOTE — TOC Transition Note (Signed)
Transition of Care [facility]) - CM/SW Discharge Note   Patient Details  Name: SKYLEIGH WINDLE MRN: [id number] Date of Birth: 1970/09/13  [facility] of [facility]) CM/SW Contact:  Tom-Johnson, Hershal Coria, RN Phone Number: 06/12/2021, 12:54 PM   Clinical Narrative:     Patient is scheduled for discharge today. No PT/OT recommendations noted. Patient screened for Medicaid eligibility and response from [employer] states she was screened and does not appear to qualify at this time. Patient's chart showing [employer] as insurance but patient states she does not know how she got it.  Benefit check done for : 1. BECLOMETHASONE DIPROPIONATE BUDESONIDE INHALER  NON-FORMULARY   BRAND ONLY  QVAR INHALER  COVER- YES  CO-PAY- $15.00  TIER- 3 DRUG  PRIOR APPROVAL- NO    2. FLUTICASONE PROPIONATE MOMETASONE INHALER  COVER- YES  CO-PAY : ZERO DOLLARS AND ONLY ONE NASAL SPRAY PER MO  TIER- 1 DRUG  PRIOR APPROVAL- NO   NO DEDUCTIBLE WITH PLAN   PREFERRED PHARMACY : YES  [facility], [facility], [facility], [facility]  [facility], [facility]   Prescriptions sent to [facility] pharmacy and medications delivered to patient at bedside.   Daughter to transport at discharge. No further TOC needs noted.    Final next level of care: Home/Self Care Barriers to Discharge: Barriers Resolved   Patient Goals and CMS Choice Patient states their goals for this hospitalization and ongoing recovery are:: To return home CMS Medicare.gov Compare Post Acute Care list provided to:: Patient Choice offered to / list presented to : Patient  Discharge Placement                Patient to be transferred to facility by: Daughter      Discharge Plan and Services   Discharge Planning Services: CM Consult Post Acute Care Choice: Durable Medical Equipment          DME Arranged: N/A DME Agency: NA       HH Arranged: NA HH Agency: NA        Social Determinants of Health (SDOH)  Interventions     Readmission Risk Interventions     View : No data to display.

## 2021-06-20 NOTE — Progress Notes (Signed)
Patient Name: Christy Barnett  Date of Birth: 11/21/1970  MRN: [id number]  PCP: [name], NP  Referring Provider: [name], NP, Ph#: [phone]    CC:  New patient - initial evaluation and management of hepatitis C.     HPI/ROS:  Christy Barnett is a 51 y.o. female referred for assistance to manage hepatitis C.    Self reported during ER encounter recently. 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 Intermittently using substances off and on since 18. Husband passed away from AIDS.   First tested about 12 years ago during incarceration.  Had a previous HIV+ positive partner who has since passed. She has been HIV negative with most recent testing 2 weeks ago.   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 She has not had follow up testing for this and worries if she may have it still. She has not had a sexual partner since the beginning of the year 2022.    Review of Systems  Constitutional:  Negative for appetite change, fatigue, fever and unexpected weight change.  Respiratory:  Positive for cough and chest tightness. Negative for shortness of breath.   Cardiovascular:  Negative for chest pain and leg swelling.  Gastrointestinal:  Negative for abdominal pain, blood in stool, nausea and vomiting.  Genitourinary:  Negative for difficulty urinating and hematuria.  Musculoskeletal:  Negative for arthralgias.  Skin:  Negative for color change.  Neurological:  Negative for dizziness, tremors and headaches.  Hematological:  Negative for adenopathy.  Psychiatric/Behavioral:  Negative for confusion.    All other systems reviewed and are negative      Past Medical History:  Diagnosis Date   Asthma    Bronchitis    Hepatitis C    Heroin abuse (HCC)     Prior to Admission medications   Medication Sig  Start Date End Date Taking? Authorizing Provider  albuterol (PROVENTIL) (2.5 MG/3ML) 0.083% nebulizer solution Use 1 vial (2.5 mg total) by nebulization 4 (four) times daily for 3 days, THEN 1 vial (2.5 mg total) every 4 (four) hours as needed for wheezing or shortness of breath. 06/12/21 09/13/21  Narda Bonds, MD  albuterol (VENTOLIN HFA) 108 (90 Base) MCG/ACT inhaler Inhale 2 puffs into the lungs every 6 (six) hours as needed for wheezing or shortness of breath. 05/20/21   [provider]  beclomethasone (QVAR REDIHALER) 80 MCG/ACT inhaler Inhale 1 puff into the lungs 2 (two) times daily. 06/16/21   Narda Bonds, MD  furosemide (LASIX) 40 MG tablet Take 40 mg by mouth daily as needed for fluid or edema. 05/20/21   [provider]  gabapentin (NEURONTIN) 100 MG capsule Take 100 mg by mouth 3 (three) times daily.    [provider]  ibuprofen (ADVIL) 200 MG tablet Take 200 mg by mouth daily as needed for headache (pain).    [provider]  METHADONE HCL PO Take 100 mg by mouth See admin instructions. Take 100 mg by mouth four times weekly.    [provider]  OVER THE COUNTER MEDICATION Take 1 capsule by mouth every morning. Focus factor    [provider]  valACYclovir (VALTREX) 500 MG tablet Take 500 mg by mouth every morning.    [provider]  potassium chloride SA (K-DUR,KLOR-CON) 20  MEQ tablet Take 1 tablet (20 mEq total) by mouth 2 (two) times daily as needed (with lasix). Patient not taking: Reported on 12/02/2018 08/02/17 12/02/18  [name], FNP    No Known Allergies  Social History   Tobacco Use   Smoking status: Every Day    Packs/day: 0.50    Types: Cigarettes   Smokeless tobacco: Never  Vaping Use   Vaping Use: Every day  Substance Use Topics   Alcohol use: No   Drug use: Not Currently    Comment: xanax    Family History  Problem Relation Age of Onset   Heart disease Father    Heart disease Paternal  Grandfather    COPD Mother      Objective:   Vitals:   06/23/21 1432  BP: 124/85  Pulse: 82  Temp: 98 F (36.7 C)  SpO2: 99%   Constitutional: in no apparent distress, non-toxic, and oriented times 3 Eyes: anicteric Cardiovascular: Cor RRR Respiratory: clear Gastrointestinal: Bowel sounds are normal, liver is not enlarged, spleen is not enlarged Musculoskeletal: peripheral pulses normal, no pedal edema, no clubbing or cyanosis Skin: negative for - jaundice, spider hemangioma, telangiectasia, palmar erythema, ecchymosis and atrophy; no porphyria cutanea tarda Lymphatic: no cervical lymphadenopathy   Laboratory: Genotype: No results found for: HCVGENOTYPE HCV viral load: No results found for: HCVQUANT Lab Results  Component Value Date   WBC 13.7 (H) 06/10/2021   HGB 13.2 06/10/2021   HCT 41.1 06/10/2021   MCV 89.5 06/10/2021   PLT 307 06/10/2021    Lab Results  Component Value Date   CREATININE 0.86 06/10/2021   BUN 22 (H) 06/10/2021   NA 139 06/10/2021   K 4.6 06/10/2021   CL 104 06/10/2021   CO2 26 06/10/2021    Lab Results  Component Value Date   ALT 15 10/29/2017   AST 24 10/29/2017   ALKPHOS 56 10/29/2017    Lab Results  Component Value Date   INR 0.95 11/23/2016   BILITOT 0.4 10/29/2017   ALBUMIN 3.6 10/29/2017    Imaging:    Assessment & Plan:   Problem List Items Addressed This Visit       Unprioritized   Hepatitis C antibody positive in blood - Primary    In February 2022 she had a reactive hepatitis C antibody documenting exposure;  Follow up hepatitis RNA was not detected at that time. She has been in remission from any drug use since 2020 and low risk with regards to sexual contact. We discussed that it appears she has spontaneously cleared the viral infection without mediations. Will check hepatitis c viral load today.  Would recommend hepatitis b vaccine with her PCP if not immune.  FU will be discussed via mychart but I anticipate she  can just return PRN.       Relevant Orders   Hepatitis C RNA quantitative ([facility])   History of syphilis    Chart reviewed: non-reactive RPR in July 2019. Reactive with low titer May 2022 --> treated with 3 rounds bicillin for latent syphilis of unknown duration. Follow up in October 2022 with same titer 1:1 and she says she was re-treated.  I do not see any previous confirmatory treponemal antibody tests. Will follow up today with repeated titer. Explained that it is possible she has a serofast titer 1:1 that will persist but does not mean she is still infected - explained it is only a memory of previous infection.  Also possible she had false positive  RPR, but cannot determine since I cannot locate any FTTPAb in the chart.        Relevant Orders   RPR    Rexene AlbertsStephanie Reilley Valentine, MSN, NP-C [facility]  [email or web address] Pager: [phone] Office: [phone] [facility] Main Line: [phone]

## 2021-06-23 ENCOUNTER — Other Ambulatory Visit: Payer: Self-pay

## 2021-06-23 ENCOUNTER — Ambulatory Visit (INDEPENDENT_AMBULATORY_CARE_PROVIDER_SITE_OTHER): Payer: Self-pay | Admitting: Infectious Diseases

## 2021-06-23 ENCOUNTER — Encounter: Payer: Self-pay | Admitting: Infectious Diseases

## 2021-06-23 VITALS — BP 124/85 | HR 82 | Temp 98.0°F | Wt 201.0 lb

## 2021-06-23 DIAGNOSIS — R768 Other specified abnormal immunological findings in serum: Secondary | ICD-10-CM

## 2021-06-23 DIAGNOSIS — Z8619 Personal history of other infectious and parasitic diseases: Secondary | ICD-10-CM

## 2021-06-23 NOTE — Patient Instructions (Addendum)
No need for follow up - will let you know what your lab tests show when they are back.   [phone]) - this is the [facility] help desk if you cannot get linked to [facility].   Would recommend you get the hepatitis B vaccine booster with your primary care provider.

## 2021-06-23 NOTE — Assessment & Plan Note (Signed)
Chart reviewed: non-reactive RPR in July 2019. Reactive with low titer May 2022 --> treated with 3 rounds bicillin for latent syphilis of unknown duration. Follow up in October 2022 with same titer 1:1 and she says she was re-treated.  I do not see any previous confirmatory treponemal antibody tests. Will follow up today with repeated titer. Explained that it is possible she has a serofast titer 1:1 that will persist but does not mean she is still infected - explained it is only a memory of previous infection.  Also possible she had false positive RPR, but cannot determine since I cannot locate any FTTPAb in the chart.

## 2021-06-23 NOTE — Assessment & Plan Note (Addendum)
In February 2022 she had a reactive hepatitis C antibody documenting exposure;  Follow up hepatitis RNA was not detected at that time. She has been in remission from any drug use since 2020 and low risk with regards to sexual contact. We discussed that it appears she has spontaneously cleared the viral infection without mediations. Will check hepatitis c viral load today.  Would recommend hepatitis b vaccine with her PCP if not immune.  FU will be discussed via mychart but I anticipate she can just return PRN.

## 2021-06-25 LAB — RPR: RPR Ser Ql: NONREACTIVE

## 2021-06-25 LAB — HEPATITIS C RNA QUANTITATIVE
HCV Quantitative Log: <1.18 log IU/mL
HCV RNA, PCR, QN: <15 IU/mL

## 2021-07-11 ENCOUNTER — Other Ambulatory Visit (HOSPITAL_COMMUNITY): Payer: Self-pay

## 2021-10-01 ENCOUNTER — Other Ambulatory Visit: Payer: Self-pay

## 2021-10-01 DIAGNOSIS — N6310 Unspecified lump in the right breast, unspecified quadrant: Secondary | ICD-10-CM

## 2021-11-06 ENCOUNTER — Other Ambulatory Visit: Payer: Medicaid Other

## 2021-11-06 ENCOUNTER — Ambulatory Visit: Payer: Self-pay

## 2021-11-07 ENCOUNTER — Other Ambulatory Visit (HOSPITAL_BASED_OUTPATIENT_CLINIC_OR_DEPARTMENT_OTHER): Payer: Self-pay

## 2021-11-07 MED ORDER — COMIRNATY 30 MCG/0.3ML IM SUSY
PREFILLED_SYRINGE | INTRAMUSCULAR | 0 refills | Status: DC
  Filled 2021-11-07: qty 0.3, 1d supply, fill #0

## 2021-11-18 ENCOUNTER — Ambulatory Visit: Payer: Self-pay

## 2021-11-18 DIAGNOSIS — Z1211 Encounter for screening for malignant neoplasm of colon: Secondary | ICD-10-CM

## 2021-11-20 ENCOUNTER — Other Ambulatory Visit: Payer: Self-pay

## 2021-11-27 ENCOUNTER — Other Ambulatory Visit: Payer: Self-pay

## 2021-12-18 ENCOUNTER — Ambulatory Visit
Admission: RE | Admit: 2021-12-18 | Discharge: 2021-12-18 | Disposition: A | Discharge status: Home / Self Care | Payer: Self-pay | Source: Ambulatory Visit | Attending: Obstetrics and Gynecology | Admitting: Obstetrics and Gynecology

## 2021-12-18 ENCOUNTER — Ambulatory Visit
Admission: RE | Admit: 2021-12-18 | Discharge: 2021-12-18 | Disposition: A | Discharge status: Home / Self Care | Payer: No Typology Code available for payment source | Source: Ambulatory Visit | Attending: Obstetrics and Gynecology | Admitting: Obstetrics and Gynecology

## 2021-12-18 ENCOUNTER — Ambulatory Visit: Payer: Self-pay | Admitting: Hematology and Oncology

## 2021-12-18 VITALS — BP 100/60 | Wt 199.1 lb

## 2021-12-18 DIAGNOSIS — Z1211 Encounter for screening for malignant neoplasm of colon: Secondary | ICD-10-CM

## 2021-12-18 DIAGNOSIS — N6310 Unspecified lump in the right breast, unspecified quadrant: Secondary | ICD-10-CM

## 2021-12-18 DIAGNOSIS — Z01419 Encounter for gynecological examination (general) (routine) without abnormal findings: Secondary | ICD-10-CM

## 2021-12-18 NOTE — Patient Instructions (Signed)
Taught Jory Ee about self breast awareness. Patient did need a Pap smear today due to last Pap smear was in 2018 per patient. Let her know [facility] will cover Pap smears every 5 years unless has a history of abnormal Pap smears. Referred patient to the [facility] for diagnostic mammogram. Appointment scheduled for 12/18/21. Patient aware of appointment and will be there. Let patient know will follow up with her within the next couple weeks with results. Jory Ee verbalized understanding. Mammogram next year; Pap smear 2028.  Pascal Lux, NP 11:34 AM

## 2021-12-18 NOTE — Progress Notes (Addendum)
Christy Barnett is a 51 y.o. No obstetric history on file. female who presents to [facility] clinic today with complaint of right breast mass.    Pap Smear: Pap smear completed today. Last Pap smear was 5 years ago and was normal. Per patient has no history of an abnormal Pap smear. Last Pap smear result is not available in Epic.   Physical exam: Breasts Breasts symmetrical. No skin abnormalities bilateral breasts. No nipple retraction bilateral breasts. No nipple discharge bilateral breasts. No lymphadenopathy. No lumps palpated bilateral breasts.        Pelvic/Bimanual Ext Genitalia No lesions, no swelling and no discharge observed on external genitalia.        Vagina Vagina pink and normal texture. No lesions or discharge observed in vagina.        Cervix Cervix is present. Cervix pink and of normal texture. No discharge observed.    Uterus Uterus is present and palpable. Uterus in normal position and normal size.        Adnexae Bilateral ovaries present and palpable. No tenderness on palpation.         Rectovaginal No rectal exam completed today since patient had no rectal complaints. No skin abnormalities observed on exam.     Smoking History: Patient has is a current smoker at 1 packs per day and was referred to quit line.    Patient Navigation: Patient education provided. Access to services provided for patient through [facility] program. No interpreter provided. No transportation provided   Colorectal Cancer Screening: Per patient has never had colonoscopy completed No complaints today. FIT test given.   Breast and Cervical Cancer Risk Assessment: Patient does not have family history of breast cancer, known genetic mutations, or radiation treatment to the chest before age 44. Patient does not have history of cervical dysplasia, immunocompromised, or DES exposure in-utero.  Risk Scores as of 12/18/2021     Dondra Spry           5-year 0.69 %   Lifetime 5.76 %             Last calculated by Meryl Dare, CMA on 12/18/2021 at 11:21 AM        A: [facility] exam with pap smear Complaint of right breast mass; benign exam.  P: Referred patient to the [facility] for a diagnostic mammogram. Appointment scheduled 12/18/2021.  Pascal Lux, NP 12/18/2021 11:32 AM

## 2021-12-19 LAB — CYTOLOGY - PAP
Adequacy: ABSENT
Comment: NEGATIVE
Diagnosis: NEGATIVE
High risk HPV: NEGATIVE

## 2021-12-22 ENCOUNTER — Telehealth: Payer: Self-pay

## 2021-12-22 NOTE — Telephone Encounter (Signed)
Called patient to give pap smear results. Informed patient that pap smear was normal and HPV was negative. Based on this result her next pap smear will be due in 3-5 years. Patient voiced understanding.  

## 2022-02-13 LAB — SPECIMEN STATUS REPORT

## 2022-02-13 LAB — FECAL OCCULT BLOOD, IMMUNOCHEMICAL: Fecal Occult Bld: NEGATIVE

## 2022-03-13 ENCOUNTER — Emergency Department (HOSPITAL_BASED_OUTPATIENT_CLINIC_OR_DEPARTMENT_OTHER)
Admission: EM | Admit: 2022-03-13 | Discharge: 2022-03-13 | Discharge status: Against Medical Advice | Payer: Medicaid Other | Attending: Emergency Medicine | Admitting: Emergency Medicine

## 2022-03-13 ENCOUNTER — Other Ambulatory Visit: Payer: Self-pay

## 2022-03-13 ENCOUNTER — Emergency Department (HOSPITAL_BASED_OUTPATIENT_CLINIC_OR_DEPARTMENT_OTHER): Payer: Medicaid Other

## 2022-03-13 ENCOUNTER — Encounter (HOSPITAL_BASED_OUTPATIENT_CLINIC_OR_DEPARTMENT_OTHER): Payer: Self-pay | Admitting: Urology

## 2022-03-13 DIAGNOSIS — Z5321 Procedure and treatment not carried out due to patient leaving prior to being seen by health care provider: Secondary | ICD-10-CM | POA: Insufficient documentation

## 2022-03-13 DIAGNOSIS — R0789 Other chest pain: Secondary | ICD-10-CM | POA: Insufficient documentation

## 2022-03-13 LAB — BASIC METABOLIC PANEL
Anion gap: 8 (ref 5–15)
BUN: 12 mg/dL (ref 6–20)
CO2: 30 mmol/L (ref 22–32)
Calcium: 9.4 mg/dL (ref 8.9–10.3)
Chloride: 97 mmol/L — ABNORMAL LOW (ref 98–111)
Creatinine, Ser: 1.01 mg/dL — ABNORMAL HIGH (ref 0.44–1.00)
GFR, Estimated: >60 mL/min (ref >60)
Glucose, Bld: 90 mg/dL (ref 70–99)
Potassium: 4 mmol/L (ref 3.5–5.1)
Sodium: 135 mmol/L (ref 135–145)

## 2022-03-13 LAB — CBC
HCT: 40.5 % (ref 36.0–46.0)
Hemoglobin: 13.1 g/dL (ref 12.0–15.0)
MCH: 28.2 pg (ref 26.0–34.0)
MCHC: 32.3 g/dL (ref 30.0–36.0)
MCV: 87.1 fL (ref 80.0–100.0)
Platelets: 325 10*3/uL (ref 150–400)
RBC: 4.65 MIL/uL (ref 3.87–5.11)
RDW: 12.8 % (ref 11.5–15.5)
WBC: 6 10*3/uL (ref 4.0–10.5)
nRBC: 0 % (ref 0.0–0.2)

## 2022-03-13 LAB — TROPONIN I (HIGH SENSITIVITY): Troponin I (High Sensitivity): <2 ng/L (ref <18)

## 2022-03-13 LAB — BRAIN NATRIURETIC PEPTIDE: B Natriuretic Peptide: 34 pg/mL (ref 0.0–100.0)

## 2022-03-13 NOTE — ED Triage Notes (Signed)
Pt states left sided chest pain that started yesterday  Denies any associated symptoms

## 2022-03-13 NOTE — ED Notes (Signed)
Pt requested IV removed states leaving, unable to wait any longer. Instructed pt to return to ER for any new or worsening symptoms

## 2022-11-20 ENCOUNTER — Other Ambulatory Visit: Payer: Self-pay | Admitting: Nurse Practitioner

## 2022-11-20 DIAGNOSIS — N63 Unspecified lump in unspecified breast: Secondary | ICD-10-CM

## 2022-12-23 ENCOUNTER — Ambulatory Visit
Admission: RE | Admit: 2022-12-23 | Discharge: 2022-12-23 | Disposition: A | Discharge status: Home / Self Care | Payer: MEDICAID | Source: Ambulatory Visit | Attending: Nurse Practitioner | Admitting: Nurse Practitioner

## 2022-12-23 ENCOUNTER — Ambulatory Visit
Admission: RE | Admit: 2022-12-23 | Discharge: 2022-12-23 | Disposition: A | Discharge status: Home / Self Care | Payer: Medicaid Other | Source: Ambulatory Visit | Attending: Nurse Practitioner | Admitting: Nurse Practitioner

## 2022-12-23 DIAGNOSIS — N63 Unspecified lump in unspecified breast: Secondary | ICD-10-CM

## 2023-07-19 ENCOUNTER — Emergency Department (HOSPITAL_COMMUNITY)
Admission: EM | Admit: 2023-07-19 | Discharge: 2023-07-20 | Disposition: A | Discharge status: Home / Self Care | Payer: MEDICAID | Attending: Emergency Medicine | Admitting: Emergency Medicine

## 2023-07-19 ENCOUNTER — Encounter (HOSPITAL_COMMUNITY): Payer: Self-pay | Admitting: Emergency Medicine

## 2023-07-19 ENCOUNTER — Other Ambulatory Visit: Payer: Self-pay

## 2023-07-19 DIAGNOSIS — Z7951 Long term (current) use of inhaled steroids: Secondary | ICD-10-CM | POA: Diagnosis not present

## 2023-07-19 DIAGNOSIS — Z79899 Other long term (current) drug therapy: Secondary | ICD-10-CM | POA: Insufficient documentation

## 2023-07-19 DIAGNOSIS — I10 Essential (primary) hypertension: Secondary | ICD-10-CM | POA: Insufficient documentation

## 2023-07-19 DIAGNOSIS — J45909 Unspecified asthma, uncomplicated: Secondary | ICD-10-CM | POA: Insufficient documentation

## 2023-07-19 LAB — CBC WITH DIFFERENTIAL/PLATELET
Abs Immature Granulocytes: 0.02 10*3/uL (ref 0.00–0.07)
Basophils Absolute: 0.1 10*3/uL (ref 0.0–0.1)
Basophils Relative: 1 %
Eosinophils Absolute: 0.1 10*3/uL (ref 0.0–0.5)
Eosinophils Relative: 2 %
HCT: 47.8 % — ABNORMAL HIGH (ref 36.0–46.0)
Hemoglobin: 15.5 g/dL — ABNORMAL HIGH (ref 12.0–15.0)
Immature Granulocytes: 0 %
Lymphocytes Relative: 24 %
Lymphs Abs: 2.2 10*3/uL (ref 0.7–4.0)
MCH: 28.2 pg (ref 26.0–34.0)
MCHC: 32.4 g/dL (ref 30.0–36.0)
MCV: 86.9 fL (ref 80.0–100.0)
Monocytes Absolute: 0.8 10*3/uL (ref 0.1–1.0)
Monocytes Relative: 8 %
Neutro Abs: 5.9 10*3/uL (ref 1.7–7.7)
Neutrophils Relative %: 65 %
Platelets: 336 10*3/uL (ref 150–400)
RBC: 5.5 MIL/uL — ABNORMAL HIGH (ref 3.87–5.11)
RDW: 13.2 % (ref 11.5–15.5)
WBC: 9.1 10*3/uL (ref 4.0–10.5)
nRBC: 0 % (ref 0.0–0.2)

## 2023-07-19 LAB — URINALYSIS, MICROSCOPIC (REFLEX)

## 2023-07-19 LAB — URINALYSIS, ROUTINE W REFLEX MICROSCOPIC
Glucose, UA: NEGATIVE mg/dL
Ketones, ur: NEGATIVE mg/dL
Leukocytes,Ua: NEGATIVE
Nitrite: NEGATIVE
Protein, ur: >300 mg/dL — AB
Specific Gravity, Urine: >1.03 — ABNORMAL HIGH (ref 1.005–1.030)
pH: 6 (ref 5.0–8.0)

## 2023-07-19 LAB — COMPREHENSIVE METABOLIC PANEL WITH GFR
ALT: 17 U/L (ref 0–44)
AST: 20 U/L (ref 15–41)
Albumin: 4 g/dL (ref 3.5–5.0)
Alkaline Phosphatase: 58 U/L (ref 38–126)
Anion gap: 14 (ref 5–15)
BUN: 10 mg/dL (ref 6–20)
CO2: 25 mmol/L (ref 22–32)
Calcium: 9.5 mg/dL (ref 8.9–10.3)
Chloride: 108 mmol/L (ref 98–111)
Creatinine, Ser: 0.73 mg/dL (ref 0.44–1.00)
GFR, Estimated: >60 mL/min (ref >60)
Glucose, Bld: 115 mg/dL — ABNORMAL HIGH (ref 70–99)
Potassium: 3.6 mmol/L (ref 3.5–5.1)
Sodium: 147 mmol/L — ABNORMAL HIGH (ref 135–145)
Total Bilirubin: 0.7 mg/dL (ref 0.0–1.2)
Total Protein: 7.2 g/dL (ref 6.5–8.1)

## 2023-07-19 LAB — RAPID URINE DRUG SCREEN, HOSP PERFORMED
Amphetamines: NOT DETECTED
Barbiturates: NOT DETECTED
Benzodiazepines: NOT DETECTED
Cocaine: NOT DETECTED
Opiates: NOT DETECTED
Tetrahydrocannabinol: NOT DETECTED

## 2023-07-19 LAB — ETHANOL: Alcohol, Ethyl (B): <15 mg/dL (ref <15)

## 2023-07-19 NOTE — ED Triage Notes (Signed)
 Patient went to [facility] for heroin rehab and they sent her here for htn and medical clearance.  Patient denies si/hi.  Patient denies history of htn.  Patient gives verbal consent for MSE.

## 2023-07-20 MED ORDER — AMLODIPINE BESYLATE 5 MG PO TABS
5.0000 mg | ORAL_TABLET | Freq: Once | ORAL | Status: AC
  Administered 2023-07-20: 5 mg via ORAL
  Filled 2023-07-20: qty 1

## 2023-07-20 MED ORDER — AMLODIPINE BESYLATE 5 MG PO TABS: 5.0000 mg | ORAL_TABLET | Freq: Every day | ORAL | 0 refills | Status: DC

## 2023-07-20 NOTE — ED Provider Notes (Signed)
 [place] EMERGENCY DEPARTMENT AT [facility] Provider Note   CSN: [id number] Arrival date & time: 07/19/23  2102     Patient presents with: Hypertension   Christy Barnett is a 53 y.o. female.   The history is provided by the patient.   Patient history of opioid use disorder presents for high blood pressure.  Patient was attempting to get admitted to [facility] for treatment for her opioid use disorder.  Upon intake they told her that her blood pressure was elevated and she needed to be evaluated. Patient currently denies any active chest pain or shortness of breath.  No focal weakness to suggest stroke. Reports last heroin use was around 2 days ago. She has no other acute complaints   Past Medical History:  Diagnosis Date   Asthma    Bronchitis    Hepatitis C    Heroin abuse (HCC)     Prior to Admission medications   Medication Sig Start Date End Date Taking? Authorizing Provider  amLODipine (NORVASC) 5 MG tablet Take 1 tablet (5 mg total) by mouth daily. 07/20/23  Yes Midge Golas, MD  albuterol  (PROVENTIL ) (2.5 MG/3ML) 0.083% nebulizer solution Use 1 vial (2.5 mg total) by nebulization 4 (four) times daily for 3 days, THEN 1 vial (2.5 mg total) every 4 (four) hours as needed for wheezing or shortness of breath. Patient not taking: Reported on 06/23/2021 06/12/21 09/13/21  Briana Elgin DELENA, MD  albuterol  (VENTOLIN  HFA) 108 (90 Base) MCG/ACT inhaler Inhale 2 puffs into the lungs every 6 (six) hours as needed for wheezing or shortness of breath. 05/20/21   [provider]  beclomethasone (QVAR  REDIHALER) 80 MCG/ACT inhaler Inhale 1 puff into the lungs 2 (two) times daily. Patient not taking: Reported on 12/18/2021 06/16/21   Briana Elgin DELENA, MD  COVID-19 mRNA vaccine [phone] (COMIRNATY ) syringe Inject into the muscle. 11/07/21   Luiz Channel, MD  furosemide  (LASIX ) 40 MG tablet Take 40 mg by mouth daily as needed for fluid or edema. 05/20/21   [provider]  gabapentin (NEURONTIN) 100 MG capsule Take 100 mg by mouth 3 (three) times daily. Patient not taking: Reported on 06/23/2021    [provider]  METHADONE  HCL PO Take 100 mg by mouth See admin instructions. Take 100 mg by mouth four times weekly. Patient not taking: Reported on 06/23/2021    [provider]  potassium chloride  SA (K-DUR,KLOR-CON ) 20 MEQ tablet Take 1 tablet (20 mEq total) by mouth 2 (two) times daily as needed (with lasix ). Patient not taking: Reported on 12/02/2018 08/02/17 12/02/18  Vicenta Maduro, FNP    Allergies: Patient has no known allergies.    Review of Systems  Updated Vital Signs BP (!) 153/116 (BP Location: Left Arm)   Pulse 87   Temp 98.4 F (36.9 C)   Resp 16   Wt 86.2 kg   SpO2 98%   BMI 35.90 kg/m   Physical Exam CONSTITUTIONAL: Disheveled and mildly anxious HEAD: Normocephalic/atraumatic ENMT: Mucous membranes moist NECK: supple no meningeal signs SPINE/BACK:entire spine nontender CV: S1/S2 noted, no murmurs/rubs/gallops noted LUNGS: Lungs are clear to auscultation bilaterally, no apparent distress ABDOMEN: soft NEURO: Pt is awake/alert/appropriate, moves all extremitiesx4.  No facial droop.  Patient ambulates without difficulty SKIN: warm, color normal  (all labs ordered are listed, but only abnormal results are displayed) Labs Reviewed  COMPREHENSIVE METABOLIC PANEL WITH GFR - Abnormal; Notable for the following components:      Result Value  Sodium 147 (*)    Glucose, Bld 115 (*)    All other components within normal limits  CBC WITH DIFFERENTIAL/PLATELET - Abnormal; Notable for the following components:   RBC 5.50 (*)    Hemoglobin 15.5 (*)    HCT 47.8 (*)    All other components within normal limits  URINALYSIS, ROUTINE W REFLEX MICROSCOPIC - Abnormal; Notable for the following components:   APPearance CLOUDY (*)    Specific Gravity, Urine >1.030 (*)    Hgb urine dipstick SMALL (*)    Bilirubin  Urine MODERATE (*)    Protein, ur >300 (*)    All other components within normal limits  URINALYSIS, MICROSCOPIC (REFLEX) - Abnormal; Notable for the following components:   Bacteria, UA FEW (*)    All other components within normal limits  ETHANOL  RAPID URINE DRUG SCREEN, HOSP PERFORMED    EKG: EKG Interpretation Date/Time:  Monday July 19 2023 22:04:14 EDT Ventricular Rate:  108 PR Interval:  116 QRS Duration:  78 QT Interval:  344 QTC Calculation: 460 R Axis:   14  Text Interpretation: Sinus tachycardia with occasional Premature ventricular complexes Minimal voltage criteria for LVH, may be normal variant ( R in aVL ) Borderline ECG Interpretation limited secondary to artifact Confirmed by Midge Golas ([id number]) on 07/20/2023 3:15:44 AM  Radiology: No results found.   Procedures   Medications Ordered in the ED  amLODipine (NORVASC) tablet 5 mg (has no administration in time range)                                    Medical Decision Making Amount and/or Complexity of Data Reviewed Labs: ordered.  Risk Prescription drug management.   Patient presents from local residential treatment facility for evaluation of high blood pressure.  Patient is in no acute distress.  No signs of hypertensive emergency.  No signs of CVA or MI.  Elevated blood pressure could be related partially due to her opioid withdrawal However will start low-dose of amlodipine for the next month.  Patient safe for discharge Labs have been reviewed and overall unremarkable    Final diagnoses:  Primary hypertension    ED Discharge Orders          Ordered    amLODipine (NORVASC) 5 MG tablet  Daily        07/20/23 0323               Midge Golas, MD 07/20/23 [phone]

## 2023-07-20 NOTE — Discharge Instructions (Addendum)
 Patient is safe for discharge and can now start blood pressure medications for her high blood pressure She does not require further testing or inpatient management

## 2023-07-20 NOTE — ED Notes (Signed)
 Pt not answering to call for vitals

## 2023-08-23 NOTE — H&P (Addendum)
 [place] [facility]ist Admission History and Physical    Chief Complaint  Nausea, vomiting, abdominal pain   HPI  Christy Barnett is a 53 y.o. year old female with a PMH of moderate intermittent asthma, mild MR, history of heroin use disorder, hepatitis C, bipolar disorder who presents with 2 days of nausea, vomiting, abdominal pain.  Patient reports onset of nausea, vomiting, abdominal pain 2 days ago.  Symptoms started suddenly without provocation.  Not related to food.  She has never had similar symptoms before.  She reports about 10 episodes of emesis daily.  Denies coffee-ground emesis/hematemesis.  She reports constant epigastric abdominal pain.  She has not been able to eat or drink anything over the past 2 days.  Denies diarrhea and last bowel movement was 2 days ago.  States she is not passing gas.  She does have history of appendectomy.  She also notes a sore throat but denies cough.  No shortness of breath.  Denies chest pain but does endorse palpitations.  She also states that she has not urinated in 2 days.  ED course: Patient afebrile and tachycardic up to the 120s with systolic BP in the 160s-170s, 04% on room air.  Labs notable for potassium 3.2, normal renal function, WBC 12.9, negative beta-hCG, bladder scan with 22 mL.   CT abdomen/pelvis with contrast negative for acute abnormalities.     Assessment and Plan  Christy Barnett is a 53 y.o. year old female with a PMH of moderate intermittent asthma, mild MR, history of heroin use disorder, hepatitis C, bipolar disorder who presents with 2 days of nausea, vomiting, abdominal pain.   Assessment & Plan Abdominal pain Intractable nausea and vomiting -- Patient presents with severe epigastric abdominal pain and nausea/vomiting x10 daily.  No guarding.  SBO is a possibility given no bowel movement in 2 days with history of appendectomy, but CT abdomen/pelvis without any abnormalities. Symptoms could be related to peptic ulcer  disease  Plan - General Surgery consulted in the ED after preliminary CT report from radiologist was concerning for dilated/enlarged gallbladder - Hold off on further antibiotics (status post Zosyn x 1 in the ED) - Follow-up blood culture - Clear liquid diet for now, advance as tolerated - Twice daily IV PPI - GI consult tomorrow if no improvement for consideration of EGD - LR at 100ml/hr for 20 hours  - Check H. pylori stool antigen - Tylenol , oxycodone, IV morphine  as needed - IV zofran , IV compazine for N/V Elevated troponin -- Troponin obtained to rule out atypical chest pain/anginal equivalent given her GI symptoms.  First troponin elevated at 39.  Patient has no chest pain and I reviewed EKG, there are no significant ischemic changes.  Likely secondary to her tachycardia, however, will continue to trend troponin Asthma (CMD) -- Not on maintenance therapy, not in acute exacerbation Opioid use disorder -- History of opioid use disorder, states that she has been clean for several years now Mood disorder -- Patient has bipolar listed in the chart, states she is not currently taking anything.  Has Seroquel and Cymbalta prescribed Primary hypertension -- Patient hypertensive to 170s, likely in part due to underlying pain but also poor medication adherence.  Patient has hydrochlorothiazide  and amlodipine  prescribed but is not taking them. Restart hydrochlorothiazide  12.5mg  daily and amlodipine  5mg  daily  Diet: NPO   Code Status: Full Code   DVT Prophylaxis:Lovenox   Anticipated disposition is to Home in 2-3 days.     History  Past  Medical and Surgical History, Family History, Social History Medical History[1] Surgical History[2] Family History[3] Social History   Socioeconomic History  . Marital status: Widowed    Spouse name: Not on file  . Number of children: Not on file  . Years of education: Not on file  . Highest education level: Not on file  Occupational History  .  Not on file  Tobacco Use  . Smoking status: Every Day    Current packs/day: 0.75    Average packs/day: 0.8 packs/day for 39.6 years (29.7 ttl pk-yrs)    Types: Cigarettes    Start date: 01/20/1984  . Smokeless tobacco: Never  . Tobacco comments:    2024-Current 0.5ppd; per 2023 LS form/chart(0.75); varied hx 1ppd-0.5 ppd  Substance and Sexual Activity  . Alcohol use: No  . Drug use: Yes    Types: Heroin  . Sexual activity: Not on file  Other Topics Concern  . Not on file  Social History Narrative  . Not on file   Social Drivers of Health   Food Insecurity: No Food Insecurity (12/18/2021)   Received from [facility]   Food vital sign   . Within the past 12 months, you worried that your food would run out before you got money to buy more: Never true   . Within the past 12 months, the food you bought just didn't last and you didn't have money to get more: Never true  Transportation Needs: Unmet Transportation Needs (12/18/2021)   Received from [facility] - Transportation   . Lack of Transportation (Medical): Yes   . Lack of Transportation (Non-Medical): Yes  Safety: Not on file  Living Situation: Not on file      Allergies  Allergies[4]   Home Medications  Home Medications           * albuterol  HFA (PROVENTIL  HFA;VENTOLIN  HFA;PROAIR  HFA) 90 mcg/actuation inhaler (Expired)    Inhale 2 puffs every 6 (six) hours as needed for wheezing for up to 90 days.   * cetirizine (ZyrTEC) 10 mg tablet (Expired)    Take 10 mg by mouth Once Daily for 10 days.   * DULoxetine (CYMBALTA) 30 mg capsule    TAKE 1 CAPSULE BY MOUTH EVERY DAY   * furosemide  (LASIX ) 40 mg tablet   * gabapentin (NEURONTIN) 300 mg capsule (Expired)    Take 300 mg by mouth 3 (three) times a day for 90 days.   * IBU 600 mg tablet   * naloxone 1 mg/mL injection    For suspected opioid overdose, spray 1 mL in each nostril.  Repeat after 3 minutes if no or minimal response.   * naproxen  (NAPROSYN ) 500 mg  tablet   * QUEtiapine (SEROquel) 300 mg tablet   * valACYclovir (VALTREX) 1 gram tablet (Expired)    Take 1,000 mg by mouth 2 (two) times a day for 30 days. Indications: herpes simplex infection      Flag for Review           * albuterol  HFA (PROVENTIL  HFA;VENTOLIN  HFA;PROAIR  HFA) 90 mcg/actuation inhaler        Review of Systems  Pertinent items are noted in HPI.    Physical Exam  Temp:  [99.2 F (37.3 C)] 99.2 F (37.3 C) Heart Rate:  [100-118] 118 Resp:  [15-20] 15 BP: (168-176)/(95-106) 171/98 Body mass index is 35.9 kg/m.  On examination-  General: Well nourished, moderate acute distress Eyes: Normal conjunctiva, no scleral icterus Pulm: Clear  to auscultation, breath sounds equal bilaterally, no wheezes/crackles/stridor, non-labored respirations   CV: Normal rate, Regular rhythm, normal S1 and S2, no murmurs/rubs/gallops, trace lower extremity edema Abd: Soft, epigastric tenderness to palaption, non-distended, no guarding or rebound,  Skin: Skin is warm/dry/pink, no rashes or lesions Neuro: Alert and oriented to person/place/time/situation, moving all four extremities, no overt asymmetry noted   Psych: Mood and affect are appropriate and congruent     Labs and Results  I have reviewed the following labs and results:  Labs: Lab Results  Component Value Date   WBC 12.94 (H) 08/23/2023   HGB 14.0 08/23/2023   HCT 40.7 08/23/2023   MCV 83.0 08/23/2023   PLT 303 08/23/2023   Lab Results  Component Value Date   GLUCOSE 108 (H) 08/23/2023   CALCIUM 9.9 08/23/2023   NA 140 08/23/2023   K 3.2 (L) 08/23/2023   CO2 30 08/23/2023   CL 99 08/23/2023   BUN 11 08/23/2023   CREATININE 0.73 08/23/2023   Lab Results  Component Value Date   ALT 17 08/23/2023   AST 28 08/23/2023   BILITOT 0.8 08/23/2023   No results found for: INR, PROTIME  Micro: No results found for this visit on 08/23/23 (from the past 48 hours).  Radiology: No orders to display     EKG: No results found for this visit on 08/23/23.    Electronically signed by: Dorise Donnice Hammers, MD 08/23/2023 7:10 PM  On the day of the visit I spent 80 minutes preparing to see the patient, obtaining and/or reviewing separately obtained history, performing a medically appropriate examination and evaluation, ordering medications, test, or procedures, documenting clinical information in the electronic medical record, and individually interpreting results (not separately reported) and communicating results to the patient/caregiver.This time does not include any time spent performing procedures or assesments that are separately billable.        [1] Past Medical History: Diagnosis Date  . Bipolar affective    (CMD)   . Hepatitis C   . Heroin abuse (CMD)   . Methamphetamine abuse (CMD)   . PTSD (post-traumatic stress disorder)   [2] Past Surgical History: Procedure Laterality Date  . APPENDECTOMY     Procedure: APPENDECTOMY  . CESAREAN SECTION, UNSPECIFIED     Procedure: CESAREAN SECTION  . TUBAL LIGATION     Procedure: TUBAL LIGATION  [3] Family History Problem Relation Name Age of Onset  . Breast cancer Neg Hx    [4] No Known Allergies *Some images could not be shown.

## 2023-08-23 NOTE — ED Triage Notes (Signed)
 Abdominal pain with emesis for the past 2 days.

## 2023-08-27 NOTE — Discharge Summary (Signed)
 ------------------------------------------------------------------------------- Attestation signed by Stanly Oris, MD at 08/28/2023  2:14 PM .Discussed with  APP, agree with the assessment and plan of care as documented below  -------------------------------------------------------------------------------  Hospitalist  Discharge Summary   Name: Christy Barnett Age: 53 yrs  MRN: [id number] DOB: 21-Mar-1970  Admit Date: 08/23/2023 Admitting Physician: Dorise Donnice Hammers, MD  Discharge Date: 08/27/2023 Discharge Physician: Lauraine Olivia Critchley, PA-C, Stanly Oris, MD   Admission Diagnosis:   Hypokalemia [E87.6] Epigastric abdominal pain [R10.13] Abdominal pain [R10.9] Common bile duct dilatation [K83.8] Abnormal gall bladder diagnostic imaging [R93.2]   Discharge Diagnoses:   Principal Problem (Resolved):   Abdominal pain Active Problems:   Opioid use disorder   Asthma (CMD)   Mood disorder   Primary hypertension   Opioid abuse (CMD)   Mild episode of recurrent major depressive disorder Resolved Problems:   Elevated troponin   Intractable nausea and vomiting   TO DO List at Follow-up for PCP/Specialist:   Key Medication changes:   Start: amlodipine  and losartan (Rxs were filled by our pharmacy); uncertain if pt picked up prior to leaving Pending labs to follow up on: Bcx NGTD but not finalized at time of dc Incidental findings requiring follow-up: none Other:  Psych provided resources on AVS Pt encouraged to f/u with PCP in near future    Pending Labs     Order Current Status   Blood Culture, 1st Set Preliminary result   Blood Culture, 2nd Set Preliminary result         Hospital Course:   For full details, please see H&P, progress notes, consult notes and ancillary notes.   Briefly, Christy Barnett is a 53 y.o. female with PMHx of moderate intermittent asthma, mild MR, substance use disorder (Hx IV heroin use), hepatitis C, bipolar disorder, appendectomy,  who presents with 10 days of nausea, vomiting, abdominal pain. She was admitted to [facility] for futher work up.   While in hospital, pt underwent extensive GI work up that resulted negative (see below for details). She received supportive care and IVF resuscitation. GI consulted for persistent pain and pt declined EGD. A UDS was obtained and was positive for fentanyl , which pt later admitted to using since the death of her 6 month old grandson. Thus, symptoms were attributed to withdrawal Sx. Psych team consulted and provided pt with OP resources.   On 8/7, pt was medically ready for dc home, however she requested to remain hospitalized until 8/8. She denied safety concerns at home. On 8/8 when visiting pt with plan for dc home, she reports recurrent abdominal pain and nausea (no emesis). KUB obtained, as she hasn't had a BM in hospital (and has chronic constipation) and was unremarkable. Provider had concern for malingering and Psych team performed repeat eval with similar concerns, as pt's story then changed and she was now requesting IP detox (on a Friday afternoon). Pt deemed stable for dc from Psych perspective with OP resources provided.   While pt was medically stable for dc home, she opted to leave immediately - she refused to have repeat visit with provider, get her discharge papers (which included her OP substance use resources), or get her BP meds that were filled by our pharmacy. It is uncertain if pt will pick them up on her way out of hospital.       The patient's detailed hospital course will be summarized in a problem based approach below.   Abdominal pain - RESOLVED Intractable nausea and vomiting - RESOLVED  Presented with 10 days of severe RQ abd pain and N/V. No diarrhea. No guarding.  Pt admitted to use of fentanyl  and query if symptoms reflective of withdrawal Sx. Has no appendex. CT A/P & abd US  without abnormalities. Negative: Lipase, lactic, HCG. Final CT read  without GB issues, and no need for EGS consult (ED consulted w prelim CT c/f GB issue). S/p IVF resuscitation   KUB 8/8 obtained and unremarkable  PLAN -Abd pain and N/V resolved per pt's report on 8/7 -Tolerated Regular diet  -PPI BID  -GI consulted 8/6 - pt declined EGD -Pt never produced BM to check H. pylori stool antigen  -Followed BCx (NGTD) -Received supportive care: PRN Tylenol , zofran , compazine (caution w QTC with frequent use; QTC was WNL on 8/4 at 439). Opiates held by Psych on 8/6.    Elevated troponin - RESOLVED  Trop obtained to r/o atypical CP/anginal equivalent given GI symptoms.  Pt denies CP and EKG without significant ischemic changes.  Trops flat at 39 >> 39 >> 31.   Most likely from hypertensive urgency & fentanyl  use  PLAN -Increased BP meds as below for improved control    HTN Hypertensive urgency - RESOLVED  Elevated BP most likely d/t underlying pain, med non-compliance, fentanyl  use, withdrawal Sx She has hydrochlorothiazide  and amlodipine  prescribed, but is not taking them PLAN -Gave amlodipine  10mg /d and started losartan -Worked on pain control  -NP meds were refilled upon dc    Hypokalemia, mild - RESOLVED Improved s/p IV + PO repletion  Discontinued hydrochlorothiazide  (which she wasn't taking PTA)   RLE swelling Pt reports this is chronic over past 4 years and more edematous than LLE Pt denies any past Hx DVT Duplex of RLE negative on 8/6        Chronic medical problems   Asthma  Stable on RA; no evidence of exacerbation  Not on maintenance therapy   Opioid use disorder Hx of IVDU / opioid use disorder; states she hasn't used for 5 years  UDS positive for fentanyl  and opiates (which pt received). She reported snorting fentanyl  d/t emotional stressors.  Psych involved for withdrawal Sx. They attached OP resources to AVS (which pt did not receive upon dc)   Mood disorder  Pt has bipolar listed in EMR, says she's not currently taking  anything. Has Seroquel and Cymbalta prescribed Psych involved as above    The patient's chronic medical conditions were treated accordingly per the patient's home medication regimen except as noted in the plan above and in the medication list below.    Discharge Condition:   Disposition: Patient discharged to Home or Self Care in stable condition.  Diet at discharge: Adult Diet- Regular  Activity at Discharge: Ambulate ad lib  Wound/Incision Care Instructions: NA      Subjective / Interim Hx / Physical Exam at Discharge  8/8: NAEON, afebrile, BP improved, on RA. HypoK resolved. Psych & GI involved. GI symptoms resolved on 8/7, but pt preferred dc home on 8/8.   Resting comfortably. Reports recurrent onset of abdominal pain and nausea (no emesis). She's tolerating Reg diet, +UOP, -BM yet, +flatus. Denies any withdrawal Sx. Other ROS negative.   I have concern for malingering at this time and requested Psych re-eval pt.  Per Psych, pt cleared for dc home. OP resources placed on AVS.    BP (!) 153/94   Pulse 96   Temp 98.2 F (36.8 C) (Oral)   Resp 18   Ht 1.499 m (4'  11)   Wt 77.8 kg (171 lb 9.6 oz)   SpO2 98%   BMI 34.66 kg/m   General: NAD, cooperative HEENT: NCAT, EOMI, MMM CV: RRR, no m/r/g RESP: CTAB anterior fields, + air movement, on RA GI: +BS, soft, nondistended, +TTP to L of umbilicus, old healed midline C-section scar, belly button ring GU: no suprapubic tenderness EXT: WWP, no cyanosis, RLE slightl pitting edema > LLE (chronic per pt) MSK: normal ROM, moving all extremities upon request Skin: warm and dry, intact, normal color, tattos Neuro: AAO, no apparent focal deficits Psych: flat affect, mood stable    Discharge Medications:      Medication List     START taking these medications    losartan 25 mg tablet Commonly known as: COZAAR Take 1 tablet (25 mg total) by mouth daily.   magnesium  oxide 400 mg (241 mg magnesium ) Tab Take 1 tablet  (400 mg total) by mouth 2 (two) times a day for 2 days.       CHANGE how you take these medications    amLODIPine  10 mg tablet Commonly known as: NORVASC  Take 1 tablet (10 mg total) by mouth daily. What changed:  medication strength how much to take       CONTINUE taking these medications    albuterol  HFA 90 mcg/actuation inhaler Commonly known as: PROVENTIL  HFA;VENTOLIN  HFA;PROAIR  HFA Inhale 2 puffs every 6 (six) hours as needed for wheezing for up to 90 days.       STOP taking these medications    celecoxib 200 mg capsule Commonly known as: CeleBREX   cetirizine 10 mg tablet Commonly known as: ZyrTEC   DULoxetine 30 mg capsule Commonly known as: CYMBALTA   furosemide  40 mg tablet Commonly known as: LASIX    gabapentin 300 mg capsule Commonly known as: NEURONTIN   hydroCHLOROthiazide  25 mg tablet Commonly known as: HYDRODIURIL    IBU 600 mg tablet Generic drug: ibuprofen    ipratropium-albuteroL  0.5-2.5 mg/3 mL nebulizer solution Commonly known as: DUO-NEB   naloxone 1 mg/mL injection   naproxen  500 mg tablet Commonly known as: NAPROSYN    ondansetron  4 mg disintegrating tablet Commonly known as: ZOFRAN -ODT   potassium chloride  20 mEq ER tablet   QUEtiapine 300 mg tablet Commonly known as: SEROquel   valACYclovir 1 gram tablet Commonly known as: VALTREX         Where to Get Your Medications     These medications were sent to [facility]  [address] [place] [address]    Hours: Mon-Fri 8:30am-5pm; Sat-Sun: Closed; Holidays: Closed Phone: [phone]  amLODIPine  10 mg tablet losartan 25 mg tablet magnesium  oxide 400 mg (241 mg magnesium ) Tab     Significant Diagnostic Tests:   LABS:  Lab Results  Component Value Date   WBC 6.59 08/25/2023   HGB 12.8 08/25/2023   HCT 37.5 08/25/2023   PLT 242 08/25/2023   CHOL 174 11/04/2020   TRIG 103 11/04/2020   HDL 51 11/04/2020   LDLDIRECT 103 11/04/2020    ALT 17 08/23/2023   AST 28 08/23/2023   NA 138 08/27/2023   K 3.5 08/27/2023   CL 98 08/27/2023   CREATININE 0.74 08/27/2023   BUN 18 08/27/2023   CO2 28 08/27/2023   HGBA1C 5.5 11/04/2020   IMAGING:  US  Abdomen Limited  Final Result by Kate Almarie Plummer, MD (08/04 2103)  CLINICAL DATA:  Epigastric abdominal pain.    EXAM:  ULTRASOUND ABDOMEN LIMITED RIGHT UPPER  QUADRANT    COMPARISON:  CT abdomen pelvis 08/23/2023, CT abdomen pelvis  09/13/2010 report without imaging.    FINDINGS:  Gallbladder:    No gallstones or wall thickening visualized. No sonographic Murphy  sign noted by sonographer.    Common bile duct:    Diameter: 3 mm.    Liver:    No focal lesion identified. Within normal limits in parenchymal  echogenicity. Portal vein is patent on color Doppler imaging with  normal direction of blood flow towards the liver.    Other: None.    IMPRESSION:  Unremarkable right upper quadrant ultrasound.      Electronically Signed    By: Morgane  Naveau M.D.    On: 08/23/2023 21:03      CT Abdomen Pelvis W Contrast  Final Result by Kate Almarie Plummer, MD (08/04 2041)  CLINICAL DATA:  Peritonitis or perforation suspected    EXAM:  CT ABDOMEN AND PELVIS WITH CONTRAST    TECHNIQUE:  Multidetector CT imaging of the abdomen and pelvis was performed  using the standard protocol following bolus administration of  intravenous contrast.    RADIATION DOSE REDUCTION: This exam was performed according to the  departmental dose-optimization program which includes automated  exposure control, adjustment of the mA and/or kV according to  patient size and/or use of iterative reconstruction technique.    CONTRAST:  100 mL iohexoL (OMNIPAQUE) 350 mg iodine/mL injection  (MDV) 100 mL    COMPARISON:  None Available.    FINDINGS:  Lower chest: Tiny hiatal hernia.  No acute abnormality.    Hepatobiliary: No focal liver abnormality. No gallstones,   gallbladder wall thickening, or pericholecystic fluid. No biliary  dilatation.    Pancreas: No focal lesion. Normal pancreatic contour. No surrounding  inflammatory changes. No main pancreatic ductal dilatation.    Spleen: Normal in size without focal abnormality.    Adrenals/Urinary Tract:    No adrenal nodule bilaterally.    Bilateral kidneys enhance symmetrically.    No hydronephrosis. No hydroureter.    The urinary bladder is unremarkable.    On delayed imaging, there is no urothelial wall thickening and there  are no filling defects in the opacified portions of the bilateral  collecting systems or ureters.    Stomach/Bowel: Stomach is within normal limits. No evidence of bowel  wall thickening or dilatation. No pneumatosis. Status post  appendectomy.    Vascular/Lymphatic: No abdominal aorta or iliac aneurysm. No  abdominal, pelvic, or inguinal lymphadenopathy.    Reproductive: Uterus and bilateral adnexa are unremarkable.    Other: No intraperitoneal free fluid. No intraperitoneal free gas.  No organized fluid collection.    Musculoskeletal:    No abdominal wall hernia or abnormality.    No suspicious lytic or blastic osseous lesions. No acute displaced  fracture. Multilevel degenerative changes of the spine.    IMPRESSION:  1. No acute intra-abdominal or intrapelvic abnormality.  2. Tiny hiatal hernia.  3. Status post appendectomy.      Electronically Signed    By: Morgane  Naveau M.D.    On: 08/23/2023 20:41       CULTURES:  Results for orders placed or performed during the hospital encounter of 08/23/23 (from the past week)  Blood Culture, 1st Set   Specimen: Venous; Blood  Result Value Ref Range   Blood Culture No growth after 3 days.   Blood Culture, 2nd Set   Specimen: Venous; Blood  Result Value Ref Range   Blood Culture No  growth after 3 days.    PATHOLOGY: NA  Surgeries/Procedures:     Other procedures performed: None  Consults:    IP CONSULT TO GENERAL SURGERY IP CONSULT TO HOSPITALIST IP CONSULT TO GASTROENTEROLOGY IP CONSULT TO PSYCHIATRY  Follow-up Appointments:    No future appointments.     Predictive Model Details       14% (Medium) Factor Value   Risk of Unplanned Readmission Model Number of active inpatient medication orders 32    Diagnosis of drug abuse present    Active NSAID inpatient medication order present    Number of ED visits in last six months 2    ECG/EKG order present in last 6 months    Diagnosis of electrolyte disorder present    Imaging order present in last 6 months    Diagnosis of deficiency anemia present    Active anticoagulant inpatient medication order present    Age 49    Current length of stay 3.525 days    Charlson Comorbidity Index 2    Active ulcer inpatient medication order present      This document was created using the aid of voice recognition Dragon dication software. Please excuse any sound alike substitutions, typographic, or transcription errors. Efforts have been made to correct these dictation errors; however some may persist, and this does not reflect the standard of medical care. If there are any questions please do not hesitate to contact me for clarification.   Electronically signed by: Lauraine Olivia Critchley, PA-C 08/27/2023 11:52 AM Time spent on discharge: 50 minutes.

## 2023-08-27 NOTE — Nursing Note (Addendum)
 Pt told RN that she is leaving AMA and has refused to talk to a MD or social worker before leaving. Pt refusing to sign forms. Jules, PA-C made aware. Nursing manager Con made aware.

## 2023-09-03 NOTE — Progress Notes (Signed)
 [facility] Alert Outreach Telephone Call Attempt   Patient is being outreached by the Care Transitions Program for a clinical alert from the [facility] monitoring program.   Outreach Attempt Date: 09/03/2023 Left message.

## 2023-11-05 NOTE — ED Provider Notes (Signed)
 ------------------------------------------------------------------------------- Attestation signed by Norleen Ozell Towana PONCE, MD at 11/05/2023  6:10 AM ________________________________________________________________________ ATTENDING [facility] SERVICE NOTE  I supervised the care provided by the APP. We have discussed the patient's case. I have reviewed the note and agree with the plan of treatment.  I have personally seen and examined the patient, providing direct face to face care.    I performed a substantive portion of the history, physical exam, and medical decision making for the patient encounter, as documented by the APP.    I have personally viewed the imaging studies performed.  Patient's presentation is most consistent with acute presentation with potential threat to life or bodily function.   I was present for the following procedures: None  Time Spent in Critical Care of the patient: None   -------------------------------------------------------------------------------  [facility] Emergency Department Emergency Department Provider Note  This document was created using the aid of voice recognition Dragon dictation software.   Provider at bedside: 11/05/2023 1:23 AM  History obtained from the: Patient  History   Chief Complaint  Patient presents with  . Headache  . Abdominal Pain     History provided by:  Patient Language interpreter used: No     Christy Barnett is a 53 y.o. female who presents to the ED with complaints of headache and abdominal pain.  Patient reports having headache that has been gradual in onset, progressively worsening over the past 2 days.  Associate with photophobia, phonophobia, nausea and vomiting.  She also reports having some upper abdominal pain.  No recent falls or head trauma.  She denies any significant history of headaches.  Prior abdominal surgery notable for C-section.  ______________________ ROS:  Pertinent positives and negatives per HPI.  Physical Exam   Vitals:   11/05/23 0018 11/05/23 0344 11/05/23 0358 11/05/23 0429  BP: (!) 182/96   (!) 202/89  BP Location: Right arm   Right arm  Patient Position: Sitting   Lying  Pulse: 88   107  Resp: 15 18 18 16   Temp: 99.5 F (37.5 C)     TempSrc: Oral     SpO2: 98%   95%  Weight: 82.7 kg (182 lb 6.4 oz)     Height: 149.9 cm (4' 11)       Physical Exam Vitals and nursing note reviewed.  Constitutional:      General: She is not in acute distress.    Appearance: Normal appearance.     Comments: Groaning in pain on the stretcher.   HENT:     Head: Normocephalic and atraumatic.  Eyes:     Pupils: Pupils are equal, round, and reactive to light.     Comments: Tearing noted  Neck:     Comments: Can touch chin to chest Cardiovascular:     Rate and Rhythm: Normal rate and regular rhythm.  Pulmonary:     Effort: Pulmonary effort is normal.     Breath sounds: Normal breath sounds.  Abdominal:     General: There is no distension.     Palpations: Abdomen is soft.     Tenderness: There is abdominal tenderness in the right upper quadrant, epigastric area and left upper quadrant.  Musculoskeletal:        General: Normal range of motion.     Cervical back: Normal range of motion.  Skin:    General: Skin is warm and dry.  Neurological:     Mental Status: She is alert and oriented to person,  place, and time.     Sensory: Sensation is intact.  Psychiatric:        Mood and Affect: Mood normal.     Results    ED Course     Medical Decision Making   DDX: Gastritis, GERD, peptic ulcer disease, cholecystitis, colitis, gastroenteritis, appendicitis, small bowel obstruction, urinary tract infection, mesenteric ischemia, diverticulitis, pancreatitis, ICH, SDH, intracranial mass, meningitis, migraine  Clinical Complexity  Patient's presentation is most consistent with acute presentation with potential threat to life or bodily  function.  Provider time spent in patient care today, inclusive of but not limited to clinical reassessment, review of diagnostic studies, and discharge preparation, was greater than 30 minutes.   All Imaging and lab work personally viewed and interpreted by myself. My interpretations are as follow:  Patient presenting to the ED for evaluation of symptoms as above.  Upon initial evaluation, she is hypertensive and appears uncomfortable.  She has no neurologic deficits. Given that she is most concerned about her headache and has elevated blood pressure here, CT head as well as abdomen/pelvis was obtained.  CT head shows no acute intracranial abnormality. CT abdomen/pelvis shows dilated appearance of the gallbladder without additional findings to suggest acute cholecystitis.  Do not suspect acute cholecystitis given no leukocytosis on CBC and normal LFTs on CMP with no focal right upper quadrant tenderness.  Lipase within normal limits.  COVID, flu and RSV negative.  I later discovered that patient presented with her daughter who is also exhibiting similar symptoms found to be secondary to opioid withdrawal.  It seems very likely that patient's presentation is due to withdrawal from opiates.  Although she states she has not used opiates in the past month, her UDS is positive for opiates as well as fentanyl .  Patient was given droperidol and migraine cocktail in the ED without significant improvement.  Upon reevaluation, she continued to be hypertensive and tachycardic.  I recommended admission for detox and offered patient Suboxone which she very adamantly declined.  While I strongly suspect her symptoms are most secondary to opioid withdrawal, patient repeatedly declined treatment of this or evaluation for detox.  She preferred to be discharged and so patient will be discharged at her request. Discussed with Dr. Towana who is in agreement   Medical Decision Making Problems Addressed: Abdominal pain,  unspecified abdominal location: complicated acute illness or injury Intractable headache, unspecified chronicity pattern, unspecified headache type: complicated acute illness or injury Opioid use disorder: complicated acute illness or injury  Amount and/or Complexity of Data Reviewed Labs: ordered. Radiology: ordered.  Risk Prescription drug management.    ED Clinical Impression   1. Intractable headache, unspecified chronicity pattern, unspecified headache type   2. Abdominal pain, unspecified abdominal location   3. Opioid use disorder    FOLLOW UP [facility] -  EMERGENCY DEPARTMENT [address] [place]  [id number] [phone]  If symptoms worsen   ED Disposition     ED Disposition  Discharge   Condition  Stable   Comment  --        _____________________________

## 2023-11-05 NOTE — ED Triage Notes (Signed)
 Pt arrived to ED with c/o headache and abdominal pain x 2 days.  Pt reports vomiting.

## 2023-11-14 ENCOUNTER — Observation Stay (HOSPITAL_COMMUNITY): Payer: MEDICAID

## 2023-11-14 ENCOUNTER — Emergency Department (HOSPITAL_COMMUNITY): Payer: MEDICAID

## 2023-11-14 ENCOUNTER — Observation Stay (HOSPITAL_COMMUNITY)
Admission: EM | Admit: 2023-11-14 | Discharge: 2023-11-16 | Disposition: A | Discharge status: Home / Self Care | Payer: MEDICAID | Attending: Surgery | Admitting: Surgery

## 2023-11-14 ENCOUNTER — Encounter (HOSPITAL_COMMUNITY): Payer: Self-pay

## 2023-11-14 ENCOUNTER — Other Ambulatory Visit: Payer: Self-pay

## 2023-11-14 DIAGNOSIS — F17210 Nicotine dependence, cigarettes, uncomplicated: Secondary | ICD-10-CM | POA: Diagnosis not present

## 2023-11-14 DIAGNOSIS — Z79899 Other long term (current) drug therapy: Secondary | ICD-10-CM | POA: Diagnosis not present

## 2023-11-14 DIAGNOSIS — K819 Cholecystitis, unspecified: Secondary | ICD-10-CM | POA: Diagnosis present

## 2023-11-14 DIAGNOSIS — J45909 Unspecified asthma, uncomplicated: Secondary | ICD-10-CM | POA: Diagnosis not present

## 2023-11-14 DIAGNOSIS — R030 Elevated blood-pressure reading, without diagnosis of hypertension: Secondary | ICD-10-CM

## 2023-11-14 DIAGNOSIS — I10 Essential (primary) hypertension: Secondary | ICD-10-CM | POA: Diagnosis not present

## 2023-11-14 DIAGNOSIS — K8012 Calculus of gallbladder with acute and chronic cholecystitis without obstruction: Principal | ICD-10-CM | POA: Insufficient documentation

## 2023-11-14 DIAGNOSIS — R1010 Upper abdominal pain, unspecified: Secondary | ICD-10-CM | POA: Diagnosis present

## 2023-11-14 DIAGNOSIS — K810 Acute cholecystitis: Principal | ICD-10-CM

## 2023-11-14 LAB — HIV ANTIBODY (ROUTINE TESTING W REFLEX): HIV Screen 4th Generation wRfx: NONREACTIVE

## 2023-11-14 LAB — COMPREHENSIVE METABOLIC PANEL WITH GFR
ALT: 17 U/L (ref 0–44)
AST: 30 U/L (ref 15–41)
Albumin: 4.7 g/dL (ref 3.5–5.0)
Alkaline Phosphatase: 93 U/L (ref 38–126)
Anion gap: 14 (ref 5–15)
BUN: 7 mg/dL (ref 6–20)
CO2: 28 mmol/L (ref 22–32)
Calcium: 10.1 mg/dL (ref 8.9–10.3)
Chloride: 99 mmol/L (ref 98–111)
Creatinine, Ser: 0.66 mg/dL (ref 0.44–1.00)
GFR, Estimated: >60 mL/min (ref >60)
Glucose, Bld: 101 mg/dL — ABNORMAL HIGH (ref 70–99)
Potassium: 3.7 mmol/L (ref 3.5–5.1)
Sodium: 140 mmol/L (ref 135–145)
Total Bilirubin: 0.6 mg/dL (ref 0.0–1.2)
Total Protein: 8.1 g/dL (ref 6.5–8.1)

## 2023-11-14 LAB — URINALYSIS, ROUTINE W REFLEX MICROSCOPIC
Bilirubin Urine: NEGATIVE
Glucose, UA: NEGATIVE mg/dL
Hgb urine dipstick: NEGATIVE
Ketones, ur: 20 mg/dL — AB
Leukocytes,Ua: NEGATIVE
Nitrite: NEGATIVE
Protein, ur: NEGATIVE mg/dL
Specific Gravity, Urine: >1.046 — ABNORMAL HIGH (ref 1.005–1.030)
pH: 8 (ref 5.0–8.0)

## 2023-11-14 LAB — CBC
HCT: 41.4 % (ref 36.0–46.0)
Hemoglobin: 13.5 g/dL (ref 12.0–15.0)
MCH: 28.5 pg (ref 26.0–34.0)
MCHC: 32.6 g/dL (ref 30.0–36.0)
MCV: 87.3 fL (ref 80.0–100.0)
Platelets: 370 K/uL (ref 150–400)
RBC: 4.74 MIL/uL (ref 3.87–5.11)
RDW: 13.9 % (ref 11.5–15.5)
WBC: 11.5 K/uL — ABNORMAL HIGH (ref 4.0–10.5)
nRBC: 0 % (ref 0.0–0.2)

## 2023-11-14 LAB — LIPASE, BLOOD: Lipase: 22 U/L (ref 11–51)

## 2023-11-14 MED ORDER — ONDANSETRON HCL 4 MG/2ML IJ SOLN: 4.0000 mg | Freq: Four times a day (QID) | INTRAMUSCULAR | Status: DC | PRN

## 2023-11-14 MED ORDER — HYDROMORPHONE HCL 1 MG/ML IJ SOLN
1.0000 mg | Freq: Once | INTRAMUSCULAR | Status: AC
  Administered 2023-11-14: 1 mg via INTRAVENOUS
  Filled 2023-11-14: qty 1

## 2023-11-14 MED ORDER — IOHEXOL 300 MG/ML  SOLN
100.0000 mL | Freq: Once | INTRAMUSCULAR | Status: AC | PRN
  Administered 2023-11-14: 100 mL via INTRAVENOUS

## 2023-11-14 MED ORDER — ONDANSETRON 4 MG PO TBDP: 4.0000 mg | ORAL_TABLET | Freq: Four times a day (QID) | ORAL | Status: DC | PRN

## 2023-11-14 MED ORDER — ONDANSETRON HCL 4 MG/2ML IJ SOLN
4.0000 mg | Freq: Once | INTRAMUSCULAR | Status: AC
  Administered 2023-11-14: 4 mg via INTRAVENOUS
  Filled 2023-11-14 (×2): qty 2

## 2023-11-14 MED ORDER — PNEUMOCOCCAL 20-VAL CONJ VACC 0.5 ML IM SUSY
0.5000 mL | PREFILLED_SYRINGE | INTRAMUSCULAR | Status: DC
  Filled 2023-11-14: qty 0.5

## 2023-11-14 MED ORDER — MORPHINE SULFATE (PF) 2 MG/ML IV SOLN
1.0000 mg | INTRAVENOUS | Status: DC | PRN
  Administered 2023-11-14 – 2023-11-15 (×9): 2 mg via INTRAVENOUS
  Filled 2023-11-14 (×9): qty 1

## 2023-11-14 MED ORDER — MORPHINE SULFATE (PF) 4 MG/ML IV SOLN
4.0000 mg | Freq: Once | INTRAVENOUS | Status: AC
  Administered 2023-11-14: 4 mg via INTRAVENOUS
  Filled 2023-11-14 (×2): qty 1

## 2023-11-14 MED ORDER — PIPERACILLIN-TAZOBACTAM 3.375 G IVPB
3.3750 g | Freq: Three times a day (TID) | INTRAVENOUS | Status: DC
  Administered 2023-11-14 – 2023-11-15 (×3): 3.375 g via INTRAVENOUS
  Filled 2023-11-14 (×3): qty 50

## 2023-11-14 MED ORDER — PIPERACILLIN-TAZOBACTAM 3.375 G IVPB 30 MIN
3.3750 g | Freq: Once | INTRAVENOUS | Status: AC
  Administered 2023-11-14: 3.375 g via INTRAVENOUS
  Filled 2023-11-14: qty 50

## 2023-11-14 MED ORDER — LABETALOL HCL 5 MG/ML IV SOLN
10.0000 mg | Freq: Once | INTRAVENOUS | Status: AC
  Administered 2023-11-14: 10 mg via INTRAVENOUS
  Filled 2023-11-14: qty 4

## 2023-11-14 MED ORDER — SODIUM CHLORIDE 0.9 % IV SOLN: INTRAVENOUS | Status: AC

## 2023-11-14 MED ORDER — SODIUM CHLORIDE 0.9 % IV BOLUS
1000.0000 mL | Freq: Once | INTRAVENOUS | Status: AC
  Administered 2023-11-14: 1000 mL via INTRAVENOUS

## 2023-11-14 MED ORDER — KETOROLAC TROMETHAMINE 15 MG/ML IJ SOLN
15.0000 mg | Freq: Once | INTRAMUSCULAR | Status: AC
  Administered 2023-11-14: 15 mg via INTRAVENOUS
  Filled 2023-11-14: qty 1

## 2023-11-14 MED ORDER — MORPHINE SULFATE (PF) 4 MG/ML IV SOLN
4.0000 mg | Freq: Once | INTRAVENOUS | Status: AC
  Administered 2023-11-14: 4 mg via INTRAVENOUS
  Filled 2023-11-14: qty 1

## 2023-11-14 MED ORDER — PANTOPRAZOLE SODIUM 40 MG IV SOLR
40.0000 mg | Freq: Every day | INTRAVENOUS | Status: DC
  Administered 2023-11-14 – 2023-11-15 (×2): 40 mg via INTRAVENOUS
  Filled 2023-11-14 (×2): qty 10

## 2023-11-14 NOTE — ED Notes (Signed)
 Pt and daughter seen walking into restroom together. Upon this writer walking into the restroom I found pt laying on the floor. Pt was asked why she was on the floor and pt stated  I don't feel good.  This clinical research associate expressed understanding but reminded pt I had no room to be able to put her in yet  and asked the pt to please get off the floor and return to seat in lobby. Pt noncompliant at this time. I gave pt a specimen cup and made her aware of need for urine specimen.

## 2023-11-14 NOTE — ED Notes (Signed)
 Patient voided x1. Per lab urine sample was insufficient quantity to run.

## 2023-11-14 NOTE — ED Notes (Signed)
 Patient is unable to sit still to obtain EKG at this time. Will attempt again when pt is able to stay sill for accurate reading.

## 2023-11-14 NOTE — ED Notes (Signed)
 Pt leaning over the back of chairs in lobby, Moaning loudly. Pt is able to stop moaning long enough to ask daughter a question then goes right back to moaning and rocking on back of chair.

## 2023-11-14 NOTE — ED Provider Notes (Signed)
 [place] EMERGENCY DEPARTMENT AT [facility] Provider Note   CSN: [id number] Arrival date & time: 11/14/23  0445     Patient presents with: Abdominal Pain   Christy Barnett is a 53 y.o. female with no reported past medical history who presents to the emergency department for evaluation of abdominal pain.  Patient states she has had abdominal pain, nausea, vomiting, hematemesis for the last 2 days.  She also has self-reported fever up to 102 and chills, body aches.  She denies recent sick contacts.  Surgical history significant for appendectomy and C-section.  She is describing diffuse abdominal pain as well as some back pain.  She has not taken any pain medication prior to arrival.  Patient has no reported daily medications.    Abdominal Pain      Prior to Admission medications   Medication Sig Start Date End Date Taking? Authorizing Provider  albuterol  (PROVENTIL ) (2.5 MG/3ML) 0.083% nebulizer solution Use 1 vial (2.5 mg total) by nebulization 4 (four) times daily for 3 days, THEN 1 vial (2.5 mg total) every 4 (four) hours as needed for wheezing or shortness of breath. Patient not taking: Reported on 06/23/2021 06/12/21 09/13/21  Briana Elgin DELENA, MD  albuterol  (VENTOLIN  HFA) 108 [phone] Base) MCG/ACT inhaler Inhale 2 puffs into the lungs every 6 (six) hours as needed for wheezing or shortness of breath. 05/20/21   [provider]  amLODipine  (NORVASC ) 5 MG tablet Take 1 tablet (5 mg total) by mouth daily. 07/20/23   Midge Golas, MD  beclomethasone (QVAR  REDIHALER) 80 MCG/ACT inhaler Inhale 1 puff into the lungs 2 (two) times daily. Patient not taking: Reported on 12/18/2021 06/16/21   Briana Elgin DELENA, MD  COVID-19 mRNA vaccine [phone] (COMIRNATY ) syringe Inject into the muscle. 11/07/21   Luiz Channel, MD  furosemide  (LASIX ) 40 MG tablet Take 40 mg by mouth daily as needed for fluid or edema. 05/20/21   [provider]  gabapentin (NEURONTIN) 100 MG capsule  Take 100 mg by mouth 3 (three) times daily. Patient not taking: Reported on 06/23/2021    [provider]  METHADONE  HCL PO Take 100 mg by mouth See admin instructions. Take 100 mg by mouth four times weekly. Patient not taking: Reported on 06/23/2021    [provider]  potassium chloride  SA (K-DUR,KLOR-CON ) 20 MEQ tablet Take 1 tablet (20 mEq total) by mouth 2 (two) times daily as needed (with lasix ). Patient not taking: Reported on 12/02/2018 08/02/17 12/02/18  Vicenta Maduro, FNP    Allergies: Patient has no known allergies.    Review of Systems  Gastrointestinal:  Positive for abdominal pain.    Updated Vital Signs BP (!) 192/115 (BP Location: Left Arm)   Pulse (!) 109   Temp 99.2 F (37.3 C) (Oral)   Resp 12   Ht 5' 1 (1.549 m)   Wt 86.2 kg   SpO2 100%   BMI 35.90 kg/m   Physical Exam Vitals and nursing note reviewed.  Constitutional:      Appearance: Normal appearance.  HENT:     Head: Normocephalic and atraumatic.     Mouth/Throat:     Mouth: Mucous membranes are moist.  Eyes:     General: No scleral icterus.       Right eye: No discharge.        Left eye: No discharge.     Conjunctiva/sclera: Conjunctivae normal.  Cardiovascular:     Rate and Rhythm: Regular rhythm. Tachycardia present.  Pulses: Normal pulses.  Pulmonary:     Effort: Pulmonary effort is normal.     Breath sounds: Normal breath sounds.  Abdominal:     General: There is no distension.     Palpations: Abdomen is soft.     Tenderness: There is abdominal tenderness in the right upper quadrant. There is right CVA tenderness. Positive signs include Murphy's sign.     Comments: Significant RUQ tenderness.  Positive Murphy sign.  Significant right-sided back/flank pain.  Musculoskeletal:        General: No deformity.     Cervical back: Normal range of motion.  Skin:    General: Skin is warm and dry.     Capillary Refill: Capillary refill takes less than 2 seconds.   Neurological:     Mental Status: She is alert.     Motor: No weakness.  Psychiatric:        Mood and Affect: Mood normal.     (all labs ordered are listed, but only abnormal results are displayed) Labs Reviewed  COMPREHENSIVE METABOLIC PANEL WITH GFR - Abnormal; Notable for the following components:      Result Value   Glucose, Bld 101 (*)    All other components within normal limits  CBC - Abnormal; Notable for the following components:   WBC 11.5 (*)    All other components within normal limits  LIPASE, BLOOD  URINALYSIS, ROUTINE W REFLEX MICROSCOPIC    EKG: EKG Interpretation Date/Time:  Sunday November 14 2023 07:45:46 EDT Ventricular Rate:  107 PR Interval:  164 QRS Duration:  93 QT Interval:  352 QTC Calculation: 470 R Axis:   53  Text Interpretation: Sinus tachycardia Baseline wander in lead(s) III aVF Confirmed by Francesca Fallow ([id number]) on 11/14/2023 9:06:39 AM  Radiology: CT ABDOMEN PELVIS W CONTRAST Result Date: 11/14/2023 EXAM: CT ABDOMEN AND PELVIS WITH CONTRAST 11/14/2023 09:03:28 AM TECHNIQUE: CT of the abdomen and pelvis was performed with the administration of 100 mL of iohexol (OMNIPAQUE) 300 MG/ML solution. Multiplanar reformatted images are provided for review. Automated exposure control, iterative reconstruction, and/or weight-based adjustment of the mA/kV was utilized to reduce the radiation dose to as low as reasonably achievable. COMPARISON: None available. CLINICAL HISTORY: Abdominal pain, acute, nonlocalized. Pt states that her stomach hurts, and she has been vomiting x2 days. Due to patient moving in pain and twitching leg did full abd/pel delay incase of motion on initial scan. FINDINGS: LOWER CHEST: Wall thickening involving the distal esophagus. LIVER: The liver is unremarkable. GALLBLADDER AND BILE DUCTS: The gallbladder is distended with diffuse gallbladder wall edema, with wall thickness measuring up to 1.1 cm, and no calcified gallstone is  present. The common bile duct measures up to 8 mm proximally, without intrahepatic bile duct dilatation or signs of common bile duct dilatation. SPLEEN: The spleen is within normal limits in size and appearance. PANCREAS: Pancreas is normal in size and contour without focal lesion or ductal dilatation. ADRENAL GLANDS: Normal size and morphology bilaterally. No nodule, thickening, or hemorrhage. No periadrenal stranding. KIDNEYS, URETERS AND BLADDER: No stones in the kidneys or ureters. No hydronephrosis. No perinephric or periureteral stranding. Urinary bladder is unremarkable. GI AND BOWEL: Stomach demonstrates no acute abnormality. The appendix is not confidently identified. No signs of pericecal inflammation. Mild to moderate stool burden within the colon. There is no bowel obstruction. PERITONEUM AND RETROPERITONEUM: No ascites or focal fluid collections. No free air. VASCULATURE: Normal caliber abdominal aorta. LYMPH NODES: No lymphadenopathy. REPRODUCTIVE ORGANS: Uterus and  adnexal structures appear normal. BONES AND SOFT TISSUES: Remote healed deformity involving the right superior and inferior pubic rami. Likely posttraumatic. No acute osseous abnormality. No focal soft tissue abnormality. IMPRESSION: 1. Distended gallbladder with diffuse wall edema measuring up to 1.1 cm, without calcified gallstones. Findings suggest acute cholecystitis consider further evaluation with gallbladder sonogram. 2. Small hiatal hernia and wall thickening of the distal esophagus. Electronically signed by: Waddell Calk MD 11/14/2023 09:23 AM EDT RP Workstation: [id number]     Procedures   Medications Ordered in the ED  piperacillin-tazobactam (ZOSYN) IVPB 3.375 g (3.375 g Intravenous New Bag/Given 11/14/23 0957)  HYDROmorphone (DILAUDID) injection 1 mg (has no administration in time range)  morphine  (PF) 4 MG/ML injection 4 mg (4 mg Intravenous Given 11/14/23 0735)  ondansetron  (ZOFRAN ) injection 4 mg (4 mg Intravenous  Given 11/14/23 0737)  iohexol (OMNIPAQUE) 300 MG/ML solution 100 mL (100 mLs Intravenous Contrast Given 11/14/23 0856)  morphine  (PF) 4 MG/ML injection 4 mg (4 mg Intravenous Given 11/14/23 0923)  labetalol (NORMODYNE) injection 10 mg (10 mg Intravenous Given 11/14/23 0955)  sodium chloride  0.9 % bolus 1,000 mL (1,000 mLs Intravenous New Bag/Given 11/14/23 0956)                                Medical Decision Making Amount and/or Complexity of Data Reviewed Labs: ordered. Radiology: ordered.  Risk Prescription drug management. Decision regarding hospitalization.   This patient presents to the ED for concern of abdominal pain and nausea, this involves an extensive number of treatment options, and is a complaint that carries with it a high risk of complications and morbidity.  Differential diagnosis includes: Cholecystitis, cholelithiasis, UTI, pyelonephritis, mesenteric ischemia, SBO, gastroenteritis, enteritis  Co morbidities:  history of appendectomy  Social Determinants of Health:   history of heroin abuse   Lab Tests:  I Ordered, and personally interpreted labs.  The pertinent results include:    - WBC: 11.5  Imaging Studies:  I ordered imaging studies including CT abdomen pelvis I independently visualized and interpreted imaging which showed   1. Distended gallbladder with diffuse wall edema measuring up to 1.1 cm,  without calcified gallstones. Findings suggest acute cholecystitis consider  further evaluation with gallbladder sonogram.  2. Small hiatal hernia and wall thickening of the distal esophagus.   I agree with the radiologist interpretation  Cardiac Monitoring/ECG:  The patient was maintained on a cardiac monitor.  I personally viewed and interpreted the cardiac monitored which showed an underlying rhythm of: Sinus tachycardia  Medicines ordered and prescription drug management:  I ordered medication including  Medications  piperacillin-tazobactam  (ZOSYN) IVPB 3.375 g (3.375 g Intravenous New Bag/Given 11/14/23 0957)  HYDROmorphone (DILAUDID) injection 1 mg (has no administration in time range)  morphine  (PF) 4 MG/ML injection 4 mg (4 mg Intravenous Given 11/14/23 0735)  ondansetron  (ZOFRAN ) injection 4 mg (4 mg Intravenous Given 11/14/23 0737)  iohexol (OMNIPAQUE) 300 MG/ML solution 100 mL (100 mLs Intravenous Contrast Given 11/14/23 0856)  morphine  (PF) 4 MG/ML injection 4 mg (4 mg Intravenous Given 11/14/23 0923)  labetalol (NORMODYNE) injection 10 mg (10 mg Intravenous Given 11/14/23 0955)  sodium chloride  0.9 % bolus 1,000 mL (1,000 mLs Intravenous New Bag/Given 11/14/23 0956)   for nausea and pain control Reevaluation of the patient after these medicines showed that the patient stayed the same I have reviewed the patients home medicines and have made adjustments as needed  Test Considered:  none  Critical Interventions:   none  Consultations Obtained: - surgery  Problem List / ED Course:     ICD-10-CM   1. Cholecystitis, acute  K81.0     2. Elevated blood pressure reading  R03.0       MDM: 53 year old female who presents emergency department for evaluation of abdominal pain and vomiting.  Patient reports vomiting x 2 days.  She endorses hematemesis.  No urinary symptoms, change to bowel movements.  She has self-reported fevers and chills.  Surgical history significant for appendectomy and C-section.  Mild leukocytosis 11.5, which is unsurprising in the setting of vomiting.  CT scan shows evidence of cholecystitis with gallbladder distention and diffuse wall edema measuring 1.1 cm.  I started patient on Zosyn and placed consult to general surgery.  I spoke with Dr. Curvin from surgery, who will accept the patient and likely perform surgery tomorrow.  Additionally, patient's blood pressure is markedly elevated.  Per patient, she does not have a known history of hypertension.  Her initial blood pressure was 204/121.  She  is also mildly tachycardic to 112.  I ordered IV labetalol for blood pressure and heart rate control.  I also am giving her IV fluids for her heart rate.  Patient also has a known history of drug abuse.  I have given her 2 doses of morphine  without much response.  I then ordered IV Dilaudid.  Upon reassessment, patient reported her pain had improved.  Her blood pressure improved to 167/101.  I did educate patient on the importance of taking blood pressure medication regularly and checking her blood pressure at home.  She verbalized understanding of this.  Dispostion:  After consideration of the diagnostic results and the patients response to treatment, I feel that the patient would benefit from admission to surgery and likely cholecystectomy.    Final diagnoses:  Elevated blood pressure reading  Cholecystitis, acute    ED Discharge Orders     None          Torrence Marry GORMAN DEVONNA 11/24/23 1849    Francesca Elsie CROME, MD 11/24/23 2109

## 2023-11-14 NOTE — Plan of Care (Signed)

## 2023-11-14 NOTE — H&P (Signed)
 Christy Barnett is an 53 y.o. female.   Chief Complaint: abd pain HPI: The patient is a 53 year old white female who began having upper abdominal pain about 2 days ago.  The pain has become more severe during that time.  The pain has been associated with significant nausea and vomiting.  She came to the emergency department where a CT scan was significant for cholecystitis.  Her liver functions are normal.  She also recently had a positive urine drug screen.  She smokes regularly.  Past Medical History:  Diagnosis Date   Asthma    Bronchitis    Hepatitis C    Heroin abuse (HCC)     Past Surgical History:  Procedure Laterality Date   CESAREAN SECTION      Family History  Problem Relation Age of Onset   COPD Mother    Heart disease Father    Breast cancer Paternal Aunt    Breast cancer Paternal Aunt    Breast cancer Paternal Aunt    Heart disease Paternal Grandfather    Social History:  reports that she has been smoking cigarettes. She has never used smokeless tobacco. She reports that she does not currently use drugs. She reports that she does not drink alcohol.  Allergies: No Known Allergies  (Not in a hospital admission)   Results for orders placed or performed during the hospital encounter of 11/14/23 (from the past 48 hours)  Lipase, blood     Status: None   Collection Time: 11/14/23  7:40 AM  Result Value Ref Range   Lipase 22 11 - 51 U/L    Comment: Performed at [facility], [address], [place], [place] [address]  Comprehensive metabolic panel     Status: Abnormal   Collection Time: 11/14/23  7:40 AM  Result Value Ref Range   Sodium 140 135 - 145 mmol/L   Potassium 3.7 3.5 - 5.1 mmol/L   Chloride 99 98 - 111 mmol/L   CO2 28 22 - 32 mmol/L   Glucose, Bld 101 (H) 70 - 99 mg/dL    Comment: Glucose reference range applies only to samples taken after fasting for at least 8 hours.   BUN 7 6 - 20 mg/dL   Creatinine, Ser 9.33 0.44 - 1.00 mg/dL    Calcium 89.8 8.9 - 89.6 mg/dL   Total Protein 8.1 6.5 - 8.1 g/dL   Albumin 4.7 3.5 - 5.0 g/dL   AST 30 15 - 41 U/L   ALT 17 0 - 44 U/L   Alkaline Phosphatase 93 38 - 126 U/L   Total Bilirubin 0.6 0.0 - 1.2 mg/dL   GFR, Estimated >39 >39 mL/min    Comment: (NOTE) Calculated using the CKD-EPI Creatinine Equation (2021)    Anion gap 14 5 - 15    Comment: Performed at [facility], [address], [place], [place] [address]  CBC     Status: Abnormal   Collection Time: 11/14/23  7:40 AM  Result Value Ref Range   WBC 11.5 (H) 4.0 - 10.5 K/uL   RBC 4.74 3.87 - 5.11 MIL/uL   Hemoglobin 13.5 12.0 - 15.0 g/dL   HCT 58.5 63.9 - 53.9 %   MCV 87.3 80.0 - 100.0 fL   MCH 28.5 26.0 - 34.0 pg   MCHC 32.6 30.0 - 36.0 g/dL   RDW 86.0 88.4 - 84.4 %   Platelets 370 150 - 400 K/uL   nRBC 0.0 0.0 - 0.2 %  Comment: Performed at [facility], [address] [place], [place] [address]   CT ABDOMEN PELVIS W CONTRAST Result Date: 11/14/2023 EXAM: CT ABDOMEN AND PELVIS WITH CONTRAST 11/14/2023 09:03:28 AM TECHNIQUE: CT of the abdomen and pelvis was performed with the administration of 100 mL of iohexol (OMNIPAQUE) 300 MG/ML solution. Multiplanar reformatted images are provided for review. Automated exposure control, iterative reconstruction, and/or weight-based adjustment of the mA/kV was utilized to reduce the radiation dose to as low as reasonably achievable. COMPARISON: None available. CLINICAL HISTORY: Abdominal pain, acute, nonlocalized. Pt states that her stomach hurts, and she has been vomiting x2 days. Due to patient moving in pain and twitching leg did full abd/pel delay incase of motion on initial scan. FINDINGS: LOWER CHEST: Wall thickening involving the distal esophagus. LIVER: The liver is unremarkable. GALLBLADDER AND BILE DUCTS: The gallbladder is distended with diffuse gallbladder wall edema, with wall thickness measuring up to 1.1 cm, and no calcified  gallstone is present. The common bile duct measures up to 8 mm proximally, without intrahepatic bile duct dilatation or signs of common bile duct dilatation. SPLEEN: The spleen is within normal limits in size and appearance. PANCREAS: Pancreas is normal in size and contour without focal lesion or ductal dilatation. ADRENAL GLANDS: Normal size and morphology bilaterally. No nodule, thickening, or hemorrhage. No periadrenal stranding. KIDNEYS, URETERS AND BLADDER: No stones in the kidneys or ureters. No hydronephrosis. No perinephric or periureteral stranding. Urinary bladder is unremarkable. GI AND BOWEL: Stomach demonstrates no acute abnormality. The appendix is not confidently identified. No signs of pericecal inflammation. Mild to moderate stool burden within the colon. There is no bowel obstruction. PERITONEUM AND RETROPERITONEUM: No ascites or focal fluid collections. No free air. VASCULATURE: Normal caliber abdominal aorta. LYMPH NODES: No lymphadenopathy. REPRODUCTIVE ORGANS: Uterus and adnexal structures appear normal. BONES AND SOFT TISSUES: Remote healed deformity involving the right superior and inferior pubic rami. Likely posttraumatic. No acute osseous abnormality. No focal soft tissue abnormality. IMPRESSION: 1. Distended gallbladder with diffuse wall edema measuring up to 1.1 cm, without calcified gallstones. Findings suggest acute cholecystitis consider further evaluation with gallbladder sonogram. 2. Small hiatal hernia and wall thickening of the distal esophagus. Electronically signed by: Waddell Calk MD 11/14/2023 09:23 AM EDT RP Workstation: [id number]    Review of Systems  Constitutional: Negative.   HENT: Negative.    Eyes: Negative.   Respiratory: Negative.    Cardiovascular: Negative.   Gastrointestinal:  Positive for abdominal distention, nausea and vomiting.  Endocrine: Negative.   Genitourinary: Negative.   Musculoskeletal: Negative.   Skin: Negative.   Allergic/Immunologic:  Negative.   Neurological: Negative.   Hematological: Negative.   Psychiatric/Behavioral: Negative.      Blood pressure (!) 192/115, pulse (!) 109, temperature 99.2 F (37.3 C), temperature source Oral, resp. rate 12, height 5' 1 (1.549 m), weight 86.2 kg, SpO2 100%. Physical Exam Vitals reviewed.  Constitutional:      Appearance: Normal appearance.     Comments: Crying in bed  HENT:     Head: Normocephalic and atraumatic.     Right Ear: External ear normal.     Left Ear: External ear normal.     Nose: Nose normal.     Mouth/Throat:     Mouth: Mucous membranes are moist.     Pharynx: Oropharynx is clear.  Eyes:     General: No scleral icterus.    Extraocular Movements: Extraocular movements intact.     Conjunctiva/sclera: Conjunctivae normal.  Pupils: Pupils are equal, round, and reactive to light.  Cardiovascular:     Rate and Rhythm: Regular rhythm. Tachycardia present.     Pulses: Normal pulses.     Heart sounds: Normal heart sounds.  Pulmonary:     Effort: Pulmonary effort is normal. No respiratory distress.     Breath sounds: Normal breath sounds.  Abdominal:     General: Abdomen is flat.     Palpations: Abdomen is soft.     Comments: There is moderate upper abd pain but no guarding and abd is soft  Musculoskeletal:        General: No swelling or deformity. Normal range of motion.     Cervical back: Normal range of motion and neck supple.  Skin:    General: Skin is warm and dry.     Coloration: Skin is not jaundiced.  Neurological:     General: No focal deficit present.     Mental Status: She is alert and oriented to person, place, and time.  Psychiatric:        Mood and Affect: Mood normal.        Behavior: Behavior normal.      Assessment/Plan The patient appears to have cholecystitis.  At this point we will bring her into the hospital and start her on broad-spectrum antibiotic therapy with good IV rehydration.  We will recheck her urine drug screen.  We  will also work on her pain control and her uncontrolled hypertension.  Once her blood pressure is down and she is more comfortable we could consider removing her gallbladder during this hospitalization.  We will discuss this with the primary team tomorrow and continue to monitor closely  Deward Null III, MD 11/14/2023, 10:08 AM

## 2023-11-14 NOTE — Discharge Instructions (Addendum)
 [facility]  LAPAROSCOPIC SURGERY: POST OP INSTRUCTIONS Always review your discharge instruction sheet given to you by the facility where your surgery was performed. IF YOU HAVE DISABILITY OR FAMILY LEAVE FORMS, YOU MUST BRING THEM TO THE OFFICE FOR PROCESSING.   DO NOT GIVE THEM TO YOUR DOCTOR.  PAIN CONTROL  Pain regimen: take over-the-counter tylenol  (acetaminophen ) 1000mg  every six hours, the prescription ibuprofen  (600mg ) every six hours and the robaxin  (methocarbamol ) 750mg  every six hours. With all three of these, you should be taking something every two hours. Example: tylenol  (acetaminophen ) at 8am, ibuprofen  at 10am, robaxin  (methocarbamol ) at 12pm, tylenol  (acetaminophen ) again at 2pm, ibuprofen  again at 4pm, robaxin  (methocarbamol ) at 6pm. You also have a prescription for oxycodone, which should be taken if the tylenol  (acetaminophen ), ibuprofen , and robaxin  (methocarbamol ) are not enough to control your pain. You may take the oxycodone as frequently as every four hours as needed, but if you are taking the other medications as above, you should not need the oxycodone this frequently. You have also been given a prescription for Miralax which is a stool softener. Please take this as prescribed because the oxycodone can cause constipation and the Miralax will minimize or prevent constipation. Do not drive while taking or under the influence of the oxycodone as it is a narcotic medication. Use ice packs to help control pain.  If you need a refill on your pain medication, please contact your pharmacy.  They will contact our office to request authorization. Prescriptions will not be filled after 5pm or on week-ends.  HOME MEDICATIONS Take your usually prescribed medications unless otherwise directed.  DIET You should follow a light diet the first few days after arrival home.  Be sure to include lots of fluids daily.  Do not consume alcohol while taking oxycodone or  ibuprofen .   CONSTIPATION It is common to experience some constipation after surgery and if you are taking pain medication. Constipation will make your abdominal pain worse, so it is best to try to prevent it by increasing fluid intake and taking a stool softener. You have already been given a prescription for a mild laxative, Miralax, which you should be taking once daily. You can increase the Miralax to twice daily or even three times daily until you have a bowel movement. If still no bowel movement 24 hours after taking Miralax three times in one day, you may try magnesium  citrate, available over the counter at a local pharmacy.   WOUND/INCISION CARE Most patients will experience some swelling and bruising in the area of the incisions.  Ice packs will help.  Swelling and bruising can take several days to resolve.  May shower beginning 11/16/2023.  Do not peel off or scrub skin glue. May allow warm soapy water to run over incision, then rinse and pat dry.  Do not soak in any water (tubs, hot tubs, pools, lakes, oceans) for one week.   ACTIVITIES You may resume regular (light) daily activities beginning the next day--such as daily self-care, walking, climbing stairs--gradually increasing activities as tolerated.  You may have sexual intercourse when it is comfortable.   No lifting greater than 5 pounds for six weeks.  You may drive when you are no longer taking narcotic pain medication, you can comfortably wear a seatbelt, and you can safely maneuver your car and apply brakes.  FOLLOW-UP You should see your doctor in the office for a follow-up appointment approximately 2-3 weeks after your surgery.  You should have  been given your post-op/follow-up appointment when your surgery was scheduled.  If you did not receive a post-op/follow-up appointment, make sure that you call for this appointment within a day or two after you arrive home to ensure a convenient appointment time.  WHEN TO CALL YOUR  DOCTOR: Fever over 101.5 Inability to urinate Continued bleeding from incision. Increased pain, redness, or drainage from the incision. Increasing abdominal pain  The clinic staff is available to answer your questions during regular business hours.  Please don't hesitate to call and ask to speak to one of the nurses for clinical concerns.  If you have a medical emergency, go to the nearest emergency room or call 911.  A surgeon from [facility] is always on call at the hospital. [address], [address], [place], [place]  [address] ? [address] [address], [place], [place]   [address] [phone] ? [phone] ? FAX [phone] Web site: [email or web address]

## 2023-11-14 NOTE — ED Triage Notes (Signed)
 PT states that her stomach hurts and she has been vomiting x2 days.

## 2023-11-15 ENCOUNTER — Encounter (HOSPITAL_COMMUNITY): Payer: Self-pay

## 2023-11-15 ENCOUNTER — Other Ambulatory Visit (HOSPITAL_COMMUNITY): Payer: Self-pay

## 2023-11-15 ENCOUNTER — Observation Stay (HOSPITAL_COMMUNITY): Payer: MEDICAID | Admitting: Certified Registered Nurse Anesthetist

## 2023-11-15 ENCOUNTER — Encounter (HOSPITAL_COMMUNITY): Admission: EM | Disposition: A | Payer: Self-pay | Source: Home / Self Care | Attending: Emergency Medicine

## 2023-11-15 DIAGNOSIS — K819 Cholecystitis, unspecified: Secondary | ICD-10-CM | POA: Diagnosis not present

## 2023-11-15 HISTORY — PX: CHOLECYSTECTOMY: SHX55

## 2023-11-15 LAB — CBC
HCT: 37.6 % (ref 36.0–46.0)
Hemoglobin: 12 g/dL (ref 12.0–15.0)
MCH: 28.4 pg (ref 26.0–34.0)
MCHC: 31.9 g/dL (ref 30.0–36.0)
MCV: 89.1 fL (ref 80.0–100.0)
Platelets: 317 K/uL (ref 150–400)
RBC: 4.22 MIL/uL (ref 3.87–5.11)
RDW: 14.1 % (ref 11.5–15.5)
WBC: 6.1 K/uL (ref 4.0–10.5)
nRBC: 0 % (ref 0.0–0.2)

## 2023-11-15 LAB — SURGICAL PCR SCREEN
MRSA, PCR: NEGATIVE
Staphylococcus aureus: NEGATIVE

## 2023-11-15 LAB — URINE DRUG SCREEN
Amphetamines: NEGATIVE
Barbiturates: NEGATIVE
Benzodiazepines: NEGATIVE
Cocaine: NEGATIVE
Fentanyl: POSITIVE — AB
Methadone Scn, Ur: NEGATIVE
Opiates: POSITIVE — AB
Tetrahydrocannabinol: NEGATIVE

## 2023-11-15 SURGERY — LAPAROSCOPIC CHOLECYSTECTOMY
Anesthesia: General

## 2023-11-15 MED ORDER — LABETALOL HCL 5 MG/ML IV SOLN
INTRAVENOUS | Status: AC
  Filled 2023-11-15: qty 4

## 2023-11-15 MED ORDER — HYDROMORPHONE HCL 1 MG/ML IJ SOLN
1.0000 mg | INTRAMUSCULAR | Status: DC | PRN
  Administered 2023-11-15 (×3): 1 mg via INTRAVENOUS
  Filled 2023-11-15 (×3): qty 1

## 2023-11-15 MED ORDER — POLYETHYLENE GLYCOL 3350 17 GM/SCOOP PO POWD
17.0000 g | Freq: Every day | ORAL | 0 refills | Status: DC
  Filled 2023-11-15: qty 238, 14d supply, fill #0

## 2023-11-15 MED ORDER — IBUPROFEN 600 MG PO TABS
600.0000 mg | ORAL_TABLET | Freq: Four times a day (QID) | ORAL | 1 refills | Status: DC
  Filled 2023-11-15: qty 120, 30d supply, fill #0

## 2023-11-15 MED ORDER — OXYCODONE HCL 5 MG PO TABS
5.0000 mg | ORAL_TABLET | ORAL | 0 refills | Status: DC | PRN
  Filled 2023-11-15: qty 15, 3d supply, fill #0

## 2023-11-15 MED ORDER — MIDAZOLAM HCL 2 MG/2ML IJ SOLN
INTRAMUSCULAR | Status: AC
  Filled 2023-11-15: qty 2

## 2023-11-15 MED ORDER — 0.9 % SODIUM CHLORIDE (POUR BTL) OPTIME
TOPICAL | Status: DC | PRN
  Administered 2023-11-15: 1000 mL

## 2023-11-15 MED ORDER — HYDROMORPHONE HCL 1 MG/ML IJ SOLN
INTRAMUSCULAR | Status: AC
  Filled 2023-11-15: qty 1

## 2023-11-15 MED ORDER — BUPIVACAINE-EPINEPHRINE 0.25% -1:200000 IJ SOLN
INTRAMUSCULAR | Status: DC | PRN
  Administered 2023-11-15: 30 mL

## 2023-11-15 MED ORDER — LIDOCAINE HCL (PF) 2 % IJ SOLN
INTRAMUSCULAR | Status: DC | PRN
  Administered 2023-11-15: 100 mg via INTRADERMAL

## 2023-11-15 MED ORDER — OXYCODONE HCL 5 MG PO TABS
5.0000 mg | ORAL_TABLET | ORAL | Status: DC | PRN
  Administered 2023-11-15 – 2023-11-16 (×2): 10 mg via ORAL
  Filled 2023-11-15 (×3): qty 2

## 2023-11-15 MED ORDER — ACETAMINOPHEN 500 MG PO TABS
1000.0000 mg | ORAL_TABLET | Freq: Four times a day (QID) | ORAL | 3 refills | Status: DC
  Filled 2023-11-15: qty 120, 15d supply, fill #0

## 2023-11-15 MED ORDER — PROPOFOL 10 MG/ML IV BOLUS
INTRAVENOUS | Status: AC
  Filled 2023-11-15: qty 20

## 2023-11-15 MED ORDER — HYDROMORPHONE HCL 2 MG/ML IJ SOLN
INTRAMUSCULAR | Status: AC
  Filled 2023-11-15: qty 1

## 2023-11-15 MED ORDER — HYDROMORPHONE HCL 1 MG/ML IJ SOLN: 1.0000 mg | INTRAMUSCULAR | Status: DC | PRN

## 2023-11-15 MED ORDER — HYDROMORPHONE HCL 1 MG/ML IJ SOLN
0.2500 mg | INTRAMUSCULAR | Status: DC | PRN
  Administered 2023-11-15 (×2): 0.5 mg via INTRAVENOUS

## 2023-11-15 MED ORDER — ACETAMINOPHEN 10 MG/ML IV SOLN
1000.0000 mg | Freq: Once | INTRAVENOUS | Status: DC | PRN
  Administered 2023-11-15: 1000 mg via INTRAVENOUS

## 2023-11-15 MED ORDER — ONDANSETRON HCL 4 MG/2ML IJ SOLN
INTRAMUSCULAR | Status: DC | PRN
  Administered 2023-11-15: 4 mg via INTRAVENOUS

## 2023-11-15 MED ORDER — POLYETHYLENE GLYCOL 3350 17 G PO PACK
17.0000 g | PACK | Freq: Every day | ORAL | Status: DC
  Filled 2023-11-15: qty 1

## 2023-11-15 MED ORDER — ACETAMINOPHEN 10 MG/ML IV SOLN
INTRAVENOUS | Status: AC
  Filled 2023-11-15: qty 100

## 2023-11-15 MED ORDER — LACTATED RINGERS IV SOLN: INTRAVENOUS | Status: DC

## 2023-11-15 MED ORDER — FENTANYL CITRATE (PF) 50 MCG/ML IJ SOSY
25.0000 ug | PREFILLED_SYRINGE | INTRAMUSCULAR | Status: DC | PRN
  Administered 2023-11-15 (×3): 50 ug via INTRAVENOUS

## 2023-11-15 MED ORDER — ROCURONIUM BROMIDE 10 MG/ML (PF) SYRINGE
PREFILLED_SYRINGE | INTRAVENOUS | Status: DC | PRN
  Administered 2023-11-15: 50 mg via INTRAVENOUS

## 2023-11-15 MED ORDER — METHOCARBAMOL 750 MG PO TABS
750.0000 mg | ORAL_TABLET | Freq: Four times a day (QID) | ORAL | 1 refills | Status: DC
  Filled 2023-11-15: qty 120, 30d supply, fill #0

## 2023-11-15 MED ORDER — LABETALOL HCL 5 MG/ML IV SOLN
20.0000 mg | INTRAVENOUS | Status: DC | PRN
Start: 2023-11-15 — End: 2023-11-15
  Administered 2023-11-15: 20 mg via INTRAVENOUS

## 2023-11-15 MED ORDER — MIDAZOLAM HCL (PF) 2 MG/2ML IJ SOLN
INTRAMUSCULAR | Status: DC | PRN
  Administered 2023-11-15 (×2): 1 mg via INTRAVENOUS

## 2023-11-15 MED ORDER — BUPIVACAINE-EPINEPHRINE (PF) 0.25% -1:200000 IJ SOLN
INTRAMUSCULAR | Status: AC
  Filled 2023-11-15: qty 30

## 2023-11-15 MED ORDER — AMISULPRIDE (ANTIEMETIC) 5 MG/2ML IV SOLN
INTRAVENOUS | Status: AC
  Filled 2023-11-15: qty 2

## 2023-11-15 MED ORDER — IBUPROFEN 400 MG PO TABS: 600.0000 mg | ORAL_TABLET | Freq: Four times a day (QID) | ORAL | Status: DC

## 2023-11-15 MED ORDER — KETAMINE HCL 50 MG/5ML IJ SOSY
PREFILLED_SYRINGE | INTRAMUSCULAR | Status: AC
  Filled 2023-11-15: qty 5

## 2023-11-15 MED ORDER — FENTANYL CITRATE (PF) 250 MCG/5ML IJ SOLN
INTRAMUSCULAR | Status: AC
  Filled 2023-11-15: qty 5

## 2023-11-15 MED ORDER — ACETAMINOPHEN 500 MG PO TABS
1000.0000 mg | ORAL_TABLET | Freq: Four times a day (QID) | ORAL | Status: DC
  Administered 2023-11-15 (×2): 1000 mg via ORAL
  Filled 2023-11-15 (×3): qty 2

## 2023-11-15 MED ORDER — FENTANYL CITRATE (PF) 50 MCG/ML IJ SOSY
PREFILLED_SYRINGE | INTRAMUSCULAR | Status: AC
  Filled 2023-11-15: qty 1

## 2023-11-15 MED ORDER — FENTANYL CITRATE (PF) 250 MCG/5ML IJ SOLN
INTRAMUSCULAR | Status: DC | PRN
  Administered 2023-11-15 (×3): 50 ug via INTRAVENOUS

## 2023-11-15 MED ORDER — HYDRALAZINE HCL 20 MG/ML IJ SOLN
10.0000 mg | INTRAMUSCULAR | Status: AC | PRN
  Administered 2023-11-15 (×2): 10 mg via INTRAVENOUS
  Filled 2023-11-15: qty 1

## 2023-11-15 MED ORDER — FENTANYL CITRATE (PF) 50 MCG/ML IJ SOSY
PREFILLED_SYRINGE | INTRAMUSCULAR | Status: AC
  Filled 2023-11-15: qty 2

## 2023-11-15 MED ORDER — CHLORHEXIDINE GLUCONATE 0.12 % MT SOLN
15.0000 mL | Freq: Once | OROMUCOSAL | Status: AC
  Administered 2023-11-15: 15 mL via OROMUCOSAL

## 2023-11-15 MED ORDER — LACTATED RINGERS IR SOLN
Status: DC | PRN
  Administered 2023-11-15: 1000 mL

## 2023-11-15 MED ORDER — HYDRALAZINE HCL 20 MG/ML IJ SOLN
INTRAMUSCULAR | Status: AC
Start: 2023-11-15 — End: 2023-11-15
  Filled 2023-11-15: qty 1

## 2023-11-15 MED ORDER — LABETALOL HCL 5 MG/ML IV SOLN
INTRAVENOUS | Status: DC | PRN
  Administered 2023-11-15 (×4): 5 mg via INTRAVENOUS

## 2023-11-15 MED ORDER — PROPOFOL 10 MG/ML IV BOLUS
INTRAVENOUS | Status: DC | PRN
  Administered 2023-11-15: 150 mg via INTRAVENOUS

## 2023-11-15 MED ORDER — DEXAMETHASONE SOD PHOSPHATE PF 10 MG/ML IJ SOLN
INTRAMUSCULAR | Status: DC | PRN
  Administered 2023-11-15: 10 mg via INTRAVENOUS

## 2023-11-15 MED ORDER — ORAL CARE MOUTH RINSE: 15.0000 mL | Freq: Once | OROMUCOSAL | Status: AC

## 2023-11-15 MED ORDER — METHOCARBAMOL 500 MG PO TABS
750.0000 mg | ORAL_TABLET | Freq: Four times a day (QID) | ORAL | Status: DC
  Administered 2023-11-15: 750 mg via ORAL
  Filled 2023-11-15 (×3): qty 2

## 2023-11-15 MED ORDER — HYDROMORPHONE HCL 1 MG/ML IJ SOLN
INTRAMUSCULAR | Status: DC | PRN
  Administered 2023-11-15: 1 mg via INTRAVENOUS
  Administered 2023-11-15 (×2): .5 mg via INTRAVENOUS

## 2023-11-15 MED ORDER — SUGAMMADEX SODIUM 200 MG/2ML IV SOLN
INTRAVENOUS | Status: DC | PRN
  Administered 2023-11-15: 200 mg via INTRAVENOUS

## 2023-11-15 MED ORDER — AMISULPRIDE (ANTIEMETIC) 5 MG/2ML IV SOLN
10.0000 mg | Freq: Once | INTRAVENOUS | Status: AC | PRN
  Administered 2023-11-15: 10 mg via INTRAVENOUS

## 2023-11-15 MED ORDER — HYDROMORPHONE HCL 1 MG/ML IJ SOLN
0.2500 mg | INTRAMUSCULAR | Status: DC | PRN
  Administered 2023-11-15 (×4): 0.5 mg via INTRAVENOUS

## 2023-11-15 MED ORDER — KETAMINE HCL 10 MG/ML IJ SOLN
INTRAMUSCULAR | Status: DC | PRN
  Administered 2023-11-15: 30 mg via INTRAVENOUS

## 2023-11-15 SURGICAL SUPPLY — 38 items
BLADE CLIPPER SURG (BLADE) IMPLANT
CABLE HIGH FREQUENCY MONO STRZ (ELECTRODE) ×2 IMPLANT
CHLORAPREP W/TINT 26 (MISCELLANEOUS) ×2 IMPLANT
CLIP APPLIE 5 13 M/L LIGAMAX5 (MISCELLANEOUS) ×2 IMPLANT
COVER MAYO STAND XLG (MISCELLANEOUS) IMPLANT
COVER SURGICAL LIGHT HANDLE (MISCELLANEOUS) ×2 IMPLANT
DERMABOND ADVANCED .7 DNX12 (GAUZE/BANDAGES/DRESSINGS) ×2 IMPLANT
DISSECTOR BLUNT TIP ENDO 5MM (MISCELLANEOUS) IMPLANT
DRAPE C-ARM 42X120 X-RAY (DRAPES) IMPLANT
ELECT PENCIL ROCKER SW 15FT (MISCELLANEOUS) ×2 IMPLANT
ELECT REM PT RETURN 15FT ADLT (MISCELLANEOUS) ×2 IMPLANT
ENDOLOOP SUT PDS II 0 18 (SUTURE) IMPLANT
GLOVE BIO SURGEON STRL SZ 6.5 (GLOVE) ×2 IMPLANT
GLOVE BIOGEL PI IND STRL 6 (GLOVE) ×2 IMPLANT
GOWN STRL REUS W/ TWL LRG LVL3 (GOWN DISPOSABLE) ×2 IMPLANT
GOWN STRL REUS W/ TWL XL LVL3 (GOWN DISPOSABLE) IMPLANT
HEMOSTAT SNOW SURGICEL 2X4 (HEMOSTASIS) IMPLANT
IRRIGATION SUCT STRKRFLW 2 WTP (MISCELLANEOUS) IMPLANT
KIT BASIN OR (CUSTOM PROCEDURE TRAY) ×2 IMPLANT
LHOOK LAP DISP 36CM (ELECTROSURGICAL) IMPLANT
NDL INSUFFLATION 14GA 120MM (NEEDLE) IMPLANT
NEEDLE INSUFFLATION 14GA 120MM (NEEDLE) IMPLANT
NS IRRIG 1000ML POUR BTL (IV SOLUTION) ×2 IMPLANT
PAD ARMBOARD POSITIONER FOAM (MISCELLANEOUS) ×2 IMPLANT
POUCH RETRIEVAL ECOSAC 10 (ENDOMECHANICALS) ×2 IMPLANT
SCISSORS LAP 5X35 DISP (ENDOMECHANICALS) ×2 IMPLANT
SET CHOLANGIOGRAPH MIX (MISCELLANEOUS) IMPLANT
SET TUBE SMOKE EVAC HIGH FLOW (TUBING) ×2 IMPLANT
SLEEVE ADV FIXATION 5X100MM (TROCAR) ×4 IMPLANT
SUT MNCRL AB 4-0 PS2 18 (SUTURE) ×2 IMPLANT
SUT VIC AB 0 UR5 27 (SUTURE) IMPLANT
SUT VICRYL 0 UR6 27IN ABS (SUTURE) ×2 IMPLANT
SYSTEM BAG RETRIEVAL 10MM (BASKET) IMPLANT
TRAY LAPAROSCOPIC (CUSTOM PROCEDURE TRAY) ×2 IMPLANT
TROCAR ADV FIXATION 5X100MM (TROCAR) ×2 IMPLANT
TROCAR BALLN 12MMX100 BLUNT (TROCAR) ×2 IMPLANT
TROCAR Z-THREAD FIOS 5X100MM (TROCAR) IMPLANT
WATER STERILE IRR 1000ML POUR (IV SOLUTION) ×2 IMPLANT

## 2023-11-15 NOTE — Plan of Care (Signed)
   Problem: Education: Goal: Knowledge of General Education information will improve Description Including pain rating scale, medication(s)/side effects and non-pharmacologic comfort measures Outcome: Progressing   Problem: Health Behavior/Discharge Planning: Goal: Ability to manage health-related needs will improve Outcome: Progressing

## 2023-11-15 NOTE — Anesthesia Preprocedure Evaluation (Addendum)
 Anesthesia Evaluation  Patient identified by MRN, date of birth, ID band Patient awake    Reviewed: Allergy & Precautions, NPO status , Patient's Chart, lab work & pertinent test results  History of Anesthesia Complications Negative for: history of anesthetic complications  Airway Mallampati: II  TM Distance: >3 FB Neck ROM: Full    Dental  (+) Dental Advisory Given, Edentulous Upper, Edentulous Lower   Pulmonary asthma , Current Smoker and Patient abstained from smoking.   breath sounds clear to auscultation       Cardiovascular hypertension, Pt. on medications  Rhythm:Regular Rate:Normal     Neuro/Psych   Anxiety  Bipolar Disorder   negative neurological ROS     GI/Hepatic ,,,(+)     substance abuse  IV drug use, Hepatitis -, CCholecystitis    Endo/Other  negative endocrine ROS    Renal/GU negative Renal ROS     Musculoskeletal negative musculoskeletal ROS (+)    Abdominal   Peds  Hematology  (+) Blood dyscrasia, anemia Lab Results      Component                Value               Date                      WBC                      6.1                 11/15/2023                HGB                      12.0                11/15/2023                HCT                      37.6                11/15/2023                MCV                      89.1                11/15/2023                PLT                      317                 11/15/2023              Anesthesia Other Findings   Reproductive/Obstetrics                              Anesthesia Physical Anesthesia Plan  ASA: 2  Anesthesia Plan: General   Post-op Pain Management: Ketamine IV*   Induction: Intravenous  PONV Risk Score and Plan: 3 and Ondansetron , Dexamethasone, Midazolam and Treatment may vary due to age or medical condition  Airway Management Planned: Mask and Oral ETT  Additional Equipment:  None  Intra-op Plan:  Post-operative Plan: Extubation in OR  Informed Consent: I have reviewed the patients History and Physical, chart, labs and discussed the procedure including the risks, benefits and alternatives for the proposed anesthesia with the patient or authorized representative who has indicated his/her understanding and acceptance.     Dental advisory given  Plan Discussed with: CRNA  Anesthesia Plan Comments:          Anesthesia Quick Evaluation

## 2023-11-15 NOTE — Anesthesia Procedure Notes (Signed)
 Procedure Name: Intubation Date/Time: 11/15/2023 9:04 AM  Performed by: Cena Epps, CRNAPre-anesthesia Checklist: Patient identified, Emergency Drugs available, Suction available and Patient being monitored Patient Re-evaluated:Patient Re-evaluated prior to induction Oxygen Delivery Method: Circle System Utilized Preoxygenation: Pre-oxygenation with 100% oxygen Induction Type: IV induction Ventilation: Mask ventilation without difficulty Laryngoscope Size: Mac and 3 Grade View: Grade I Tube type: Oral Tube size: 7.0 mm Number of attempts: 1 Airway Equipment and Method: Stylet and Oral airway Placement Confirmation: ETT inserted through vocal cords under direct vision, positive ETCO2 and breath sounds checked- equal and bilateral Secured at: 21 cm Tube secured with: Tape Dental Injury: Teeth and Oropharynx as per pre-operative assessment

## 2023-11-15 NOTE — Progress Notes (Signed)
 Patient becoming verbally aggressive with staff over pain medication. RN explained to patient that she will have to start taking oral pain medication because she will be going home tomorrow. Patient calling staff expletives saying that she is going to just leave because she isn't taking oral pain meds. RN offered to grab AMA paper, patient refused. Patient continuously saying that she isn't being treated right because RN is refusing to give her pain medication. RN again explained to patient that she just got pain medication and the time frame for meds to be given. Patient continues to scream at staff and saying expletives.PA made aware of patients refusal to take oral meds.

## 2023-11-15 NOTE — Progress Notes (Signed)
 General Surgery Follow Up Note  Subjective:    Overnight Issues:   Objective:  Vital signs for last 24 hours: Temp:  [98.4 F (36.9 C)-100.2 F (37.9 C)] 99.5 F (37.5 C) (10/27 0800) Pulse Rate:  [82-109] 106 (10/27 0800) Resp:  [11-19] 18 (10/27 0800) BP: (98-195)/(69-116) 188/115 (10/27 0800) SpO2:  [94 %-100 %] 98 % (10/27 0800) Weight:  [86.2 kg] 86.2 kg (10/27 0807)  Hemodynamic parameters for last 24 hours:    Intake/Output from previous day: 10/26 0701 - 10/27 0700 In: 2325.3 [I.V.:1163.3; IV Piggyback:1162] Out: 400 [Urine:400]  Intake/Output this shift: No intake/output data recorded.  Vent settings for last 24 hours:    Physical Exam:  Gen: comfortable, no distress Neuro: follows commands, alert, communicative HEENT: PERRL Neck: supple CV: RRR Pulm: unlabored breathing on RA Abd: soft, NT   GU: urine clear and yellow, +spontaneous void Extr: wwp, no edema  Results for orders placed or performed during the hospital encounter of 11/14/23 (from the past 24 hours)  Urinalysis, Routine w reflex microscopic -     Status: Abnormal   Collection Time: 11/14/23 11:01 AM  Result Value Ref Range   Color, Urine STRAW (A) YELLOW   APPearance CLEAR CLEAR   Specific Gravity, Urine >1.046 (H) 1.005 - 1.030   pH 8.0 5.0 - 8.0   Glucose, UA NEGATIVE NEGATIVE mg/dL   Hgb urine dipstick NEGATIVE NEGATIVE   Bilirubin Urine NEGATIVE NEGATIVE   Ketones, ur 20 (A) NEGATIVE mg/dL   Protein, ur NEGATIVE NEGATIVE mg/dL   Nitrite NEGATIVE NEGATIVE   Leukocytes,Ua NEGATIVE NEGATIVE  HIV Antibody (routine testing w rflx)     Status: None   Collection Time: 11/14/23  1:57 PM  Result Value Ref Range   HIV Screen 4th Generation wRfx Non Reactive Non Reactive  Urine Drug Screen     Status: Abnormal   Collection Time: 11/15/23  3:50 AM  Result Value Ref Range   Opiates POSITIVE (A) NEGATIVE   Cocaine NEGATIVE NEGATIVE   Benzodiazepines NEGATIVE NEGATIVE   Amphetamines  NEGATIVE NEGATIVE   Tetrahydrocannabinol NEGATIVE NEGATIVE   Barbiturates NEGATIVE NEGATIVE   Methadone  Scn, Ur NEGATIVE NEGATIVE   Fentanyl  POSITIVE (A) NEGATIVE  CBC     Status: None   Collection Time: 11/15/23  4:57 AM  Result Value Ref Range   WBC 6.1 4.0 - 10.5 K/uL   RBC 4.22 3.87 - 5.11 MIL/uL   Hemoglobin 12.0 12.0 - 15.0 g/dL   HCT 62.3 63.9 - 53.9 %   MCV 89.1 80.0 - 100.0 fL   MCH 28.4 26.0 - 34.0 pg   MCHC 31.9 30.0 - 36.0 g/dL   RDW 85.8 88.4 - 84.4 %   Platelets 317 150 - 400 K/uL   nRBC 0.0 0.0 - 0.2 %  Surgical pcr screen     Status: None   Collection Time: 11/15/23  6:11 AM   Specimen: Nasal Mucosa; Nasal Swab  Result Value Ref Range   MRSA, PCR NEGATIVE NEGATIVE   Staphylococcus aureus NEGATIVE NEGATIVE    Assessment & Plan:  Present on Admission:  Cholecystitis    LOS: 0 days   Additional comments:I reviewed the patient's new clinical lab test results.   and I reviewed the patients new imaging test results.    Acute cholecystitis - plan lap chole. Informed consent was obtained after detailed explanation of risks, including bleeding, infection, biloma, hematoma, injury to common bile duct, need for IOC to delineate anatomy, and need for  conversion to open procedure. All questions answered to the patient's satisfaction. FEN - NPO except sips/chips DVT - SCDs, LMWH Dispo - plan home post-op    Dreama GEANNIE Hanger, MD Trauma & General Surgery Please use [email or web address] to contact on call provider  11/15/2023  *Care during the described time interval was provided by me. I have reviewed this patient's available data, including medical history, events of note, physical examination and test results as part of my evaluation.

## 2023-11-15 NOTE — Op Note (Signed)
   Operative Note  Date: 11/15/2023  Procedure: laparoscopic cholecystectomy  Pre-op diagnosis: acute cholecystitis Post-op diagnosis: same  Indication and clinical history: The patient is a 53 y.o. year old female with acute cholecystitis  Surgeon: Dreama GEANNIE Hanger, MD  Anesthesiologist: Leopoldo, MD Anesthesia: General  Findings:  Specimen: gallbladder EBL: 10cc Drains/Implants: surgicel  Disposition: PACU - hemodynamically stable.  Description of procedure: The patient was positioned supine on the operating room table. Time-out was performed verifying correct patient, procedure, signature of informed consent, and administration of pre-operative antibiotics. General anesthetic induction and intubation were uneventful. The abdomen was prepped and draped in the usual sterile fashion. An infra-umbilical incision was made using an open technique using zero vicryl stay sutures on either side of the fascia and a 10mm Hassan port inserted. After establishing pneumoperitoneum, which the patient tolerated well, the abdominal cavity was inspected and no injury of any intra-abdominal structures was identified. Additional ports were placed under direct visualization and using local anesthetic: two 5mm ports in the right subcostal region and a 5mm port in the epigastric region. The patient was re-positioned to reverse Trendelenburg and right side up. Adhesiolysis was performed to expose the gallbladder, which was then retracted cephalad. The infundibulum was identified and retracted toward the right lower quadrant. The peritoneum was incised over the infundibulum and the triangle of Calot dissected to expose the critical view of safety. With clear identification and isolation of the cystic duct and cystic artery, the cystic artery was doubly clipped and divided. After this, the cystic duct was identified as a single structure entering the gallbladder, and was also doubly clipped and divided. The  gallbladder was dissected off the liver bed using electrocautery and hemostasis of the liver bed was confirmed prior to separation of the final peritoneal attachments of the gallbladder to the liver bed. The gallbladder fossa was irrigated and fluid returned clear. After transection of the final peritoneal attachments, the gallbladder was placed in an endoscopic specimen retrieval bag, removed via the umbilical port site, and sent to pathology as a permanent specimen. The gallbladder fossa was inspected and hemostasis achieved using electrocautery. A piece of surgicel was also placed. The absence of bile leakage from the cystic duct stump and correct placement of clips on the cystic artery and cystic duct stumps was confirmed. The abdomen was desufflated and the fascia of the umbilical port site was closed using the previously placed stay sutures. Additional local anesthetic was administered at the umbilical port site.  The skin of all incisions was closed with 4-0 monocryl. Sterile dressings were applied. All sponge and instrument counts were correct at the conclusion of the procedure. The patient was awakened from anesthesia, extubated uneventfully, and transported to the PACU - hemodynamically stable.. There were no complications.   Upon entering the abdomen (organ space), I encountered infection of the gallbladder.  CASE DATA:  Type of patient?: LDOW CASE (Surgical Hospitalist [facility] Inpatient)  Status of Case? URGENT Add On  Infection Present At Time Of Surgery (PATOS)?  INFECTION of the gallbladder    Dreama GEANNIE Hanger, MD General and Trauma Surgery [facility] Surgery

## 2023-11-15 NOTE — Transfer of Care (Signed)
 Immediate Anesthesia Transfer of Care Note  Patient: Christy Barnett  Procedure(s) Performed: LAPAROSCOPIC CHOLECYSTECTOMY  Patient Location: PACU  Anesthesia Type:General  Level of Consciousness: awake  Airway & Oxygen Therapy: Patient Spontanous Breathing  Post-op Assessment: Report given to RN and Post -op Vital signs reviewed and stable  Post vital signs: Reviewed and stable  Last Vitals:  Vitals Value Taken Time  BP    Temp    Pulse 76 11/15/23 10:17  Resp 13 11/15/23 10:18  SpO2 100 % 11/15/23 10:17  Vitals shown include unfiled device data.  Last Pain:  Vitals:   11/15/23 0807  TempSrc:   PainSc: 10-Worst pain ever      Patients Stated Pain Goal: 3 (11/15/23 0807)  Complications: No notable events documented.

## 2023-11-16 ENCOUNTER — Other Ambulatory Visit (HOSPITAL_COMMUNITY): Payer: Self-pay

## 2023-11-16 ENCOUNTER — Encounter (HOSPITAL_COMMUNITY): Payer: Self-pay | Admitting: Surgery

## 2023-11-16 LAB — SURGICAL PATHOLOGY

## 2023-11-16 MED ORDER — AMLODIPINE BESYLATE 5 MG PO TABS: 5.0000 mg | ORAL_TABLET | Freq: Every day | ORAL | Status: DC

## 2023-11-16 MED ORDER — AMLODIPINE BESYLATE 5 MG PO TABS
5.0000 mg | ORAL_TABLET | Freq: Every day | ORAL | 0 refills | Status: DC
  Filled 2023-11-16: qty 30, 30d supply, fill #0

## 2023-11-16 NOTE — Plan of Care (Signed)
   Problem: Clinical Measurements: Goal: Ability to maintain clinical measurements within normal limits will improve Outcome: Progressing Goal: Will remain free from infection Outcome: Progressing

## 2023-11-16 NOTE — Anesthesia Postprocedure Evaluation (Signed)
 Anesthesia Post Note  Patient: Christy Barnett  Procedure(s) Performed: LAPAROSCOPIC CHOLECYSTECTOMY     Patient location during evaluation: PACU Anesthesia Type: General Level of consciousness: awake and alert Pain management: pain level controlled Vital Signs Assessment: post-procedure vital signs reviewed and stable Respiratory status: spontaneous breathing, nonlabored ventilation and respiratory function stable Cardiovascular status: blood pressure returned to baseline and stable Postop Assessment: no apparent nausea or vomiting Anesthetic complications: no   No notable events documented.                  Arlone Lenhardt

## 2023-11-16 NOTE — Discharge Summary (Signed)
 [facility] Discharge Summary   Patient ID: Christy Barnett MRN: [id number] DOB/AGE: 06/16/1970 53 y.o.  Admit date: 11/14/2023 Discharge date: 11/16/2023  Admitting Diagnosis: Acute cholecystitis  HTN  Discharge Diagnosis S/P laparoscopic cholecystectomy  HTN  Consultants None   Imaging: US  Abdomen Limited RUQ (LIVER/GB) Result Date: 11/14/2023 EXAM: Right Upper Quadrant Abdominal Ultrasound TECHNIQUE: Real-time ultrasonography of the right upper quadrant of the abdomen was performed. COMPARISON: CT of the abdomen and pelvis 11/14/2023 at 08:53 AM. CLINICAL HISTORY: Cholecystitis. Abnormal CT appearance of the gallbladder. FINDINGS: LIVER: The liver demonstrates normal echogenicity. No intrahepatic biliary ductal dilatation. No mass. BILIARY SYSTEM: The gallbladder is moderately distended. The gallbladder wall is mildly thickened at 3.4 mm. The sonographic Beverley sign is reported. Pericholecystic fluid is present. No cholelithiasis. The common bile duct measures 8 mm, slightly dilated. RIGHT KIDNEY: The right kidney is grossly unremarkable in appearances without evidence of hydronephrosis, echogenic calculi or worrisome mass lesions. PANCREAS: Visualized portions of the pancreas are unremarkable. OTHER: No right upper quadrant ascites. IMPRESSION: 1. Acute cholecystitis. 2. Mild extrahepatic biliary ductal dilatation with common bile duct measuring 8 mm. Electronically signed by: Lonni Necessary MD 11/14/2023 03:03 PM EDT RP Workstation: [id number]   CT ABDOMEN PELVIS W CONTRAST Result Date: 11/14/2023 EXAM: CT ABDOMEN AND PELVIS WITH CONTRAST 11/14/2023 09:03:28 AM TECHNIQUE: CT of the abdomen and pelvis was performed with the administration of 100 mL of iohexol (OMNIPAQUE) 300 MG/ML solution. Multiplanar reformatted images are provided for review. Automated exposure control, iterative reconstruction, and/or weight-based adjustment of the mA/kV was utilized to reduce  the radiation dose to as low as reasonably achievable. COMPARISON: None available. CLINICAL HISTORY: Abdominal pain, acute, nonlocalized. Pt states that her stomach hurts, and she has been vomiting x2 days. Due to patient moving in pain and twitching leg did full abd/pel delay incase of motion on initial scan. FINDINGS: LOWER CHEST: Wall thickening involving the distal esophagus. LIVER: The liver is unremarkable. GALLBLADDER AND BILE DUCTS: The gallbladder is distended with diffuse gallbladder wall edema, with wall thickness measuring up to 1.1 cm, and no calcified gallstone is present. The common bile duct measures up to 8 mm proximally, without intrahepatic bile duct dilatation or signs of common bile duct dilatation. SPLEEN: The spleen is within normal limits in size and appearance. PANCREAS: Pancreas is normal in size and contour without focal lesion or ductal dilatation. ADRENAL GLANDS: Normal size and morphology bilaterally. No nodule, thickening, or hemorrhage. No periadrenal stranding. KIDNEYS, URETERS AND BLADDER: No stones in the kidneys or ureters. No hydronephrosis. No perinephric or periureteral stranding. Urinary bladder is unremarkable. GI AND BOWEL: Stomach demonstrates no acute abnormality. The appendix is not confidently identified. No signs of pericecal inflammation. Mild to moderate stool burden within the colon. There is no bowel obstruction. PERITONEUM AND RETROPERITONEUM: No ascites or focal fluid collections. No free air. VASCULATURE: Normal caliber abdominal aorta. LYMPH NODES: No lymphadenopathy. REPRODUCTIVE ORGANS: Uterus and adnexal structures appear normal. BONES AND SOFT TISSUES: Remote healed deformity involving the right superior and inferior pubic rami. Likely posttraumatic. No acute osseous abnormality. No focal soft tissue abnormality. IMPRESSION: 1. Distended gallbladder with diffuse wall edema measuring up to 1.1 cm, without calcified gallstones. Findings suggest acute  cholecystitis consider further evaluation with gallbladder sonogram. 2. Small hiatal hernia and wall thickening of the distal esophagus. Electronically signed by: Waddell Calk MD 11/14/2023 09:23 AM EDT RP Workstation: [id number]    Procedures Dr. Dreama Hanger (11/15/23) - Laparoscopic Cholecystectomy   Hospital Course:  Patient is a 53 year old female who presented to [facility] with abdominal pain.  Workup showed acute cholecystitis.  Patient was admitted and underwent procedure listed above.  Tolerated procedure well and was transferred to the floor.  Diet was advanced as tolerated.  On POD1, the patient was voiding well, tolerating diet, ambulating well, pain well controlled, vital signs stable, incisions c/d/i and felt stable for discharge home.  Patient will follow up in our office in 3-4 weeks and knows to call with questions or concerns. She will call to confirm appointment date/time.    Physical Exam: General:  Alert, NAD, pleasant, comfortable Abd:  Soft, ND, mild tenderness, incisions C/D/I  I or a member of my team have reviewed this patient in the Controlled Substance Database.   Allergies as of 11/16/2023   No Known Allergies      Medication List     TAKE these medications    acetaminophen  500 MG tablet Commonly known as: TYLENOL  Take 2 tablets (1,000 mg total) by mouth 4 (four) times daily.   amLODipine  5 MG tablet Commonly known as: NORVASC  Take 1 tablet (5 mg total) by mouth daily. What changed: Another medication with the same name was added. Make sure you understand how and when to take each.   amLODipine  5 MG tablet Commonly known as: NORVASC  Take 1 tablet (5 mg total) by mouth daily. What changed: You were already taking a medication with the same name, and this prescription was added. Make sure you understand how and when to take each.   ibuprofen  600 MG tablet Commonly known as: ADVIL  Take 1 tablet (600 mg total) by mouth 4 (four) times daily.    methocarbamol  750 MG tablet Commonly known as: ROBAXIN  Take 1 tablet (750 mg total) by mouth 4 (four) times daily.   oxyCODONE 5 MG immediate release tablet Commonly known as: Roxicodone Take 1 tablet (5 mg total) by mouth every 4 (four) hours as needed.   polyethylene glycol powder 17 GM/SCOOP powder Commonly known as: MiraLax Take 17 g by mouth daily. Dissolve 1 capful (17g) in 4-8 ounces of liquid and take by mouth daily.          Follow-up Information     Maczis, Puja Gosai, PA-C. Go on 12/07/2023.   Specialty: General Surgery Why: 11:15 AM. Please arrive 30 min prior to appointment time. Bring ID and insurance card. Contact information: [address] [facility] [place] [address] [phone]         Center, [facility]. Schedule an appointment as soon as possible for a visit in 1 week(s).   Why: To follow up for high blood pressure Contact information: [address] [place] [address] [phone]                 Signed: Burnard JONELLE Louder , [facility] 11/16/2023, 8:03 AM Please see Amion for pager number during day hours 7:00am-4:30pm

## 2023-11-21 ENCOUNTER — Ambulatory Visit: Payer: Self-pay | Admitting: Surgery

## 2023-11-21 ENCOUNTER — Other Ambulatory Visit: Payer: Self-pay | Admitting: Surgery

## 2023-11-21 ENCOUNTER — Inpatient Hospital Stay: Admit: 2023-11-21 | Payer: MEDICAID | Admitting: Surgery

## 2023-11-21 ENCOUNTER — Encounter (HOSPITAL_COMMUNITY): Payer: Self-pay

## 2023-11-21 DIAGNOSIS — Z9049 Acquired absence of other specified parts of digestive tract: Secondary | ICD-10-CM

## 2023-11-21 NOTE — ED Triage Notes (Signed)
 Pt had her gallbladder removed 5 days ago. She has been vomiting with abdominal pain and not feeling well since last night.

## 2023-11-21 NOTE — ED Notes (Signed)
 [facility] called for consult at [facility].   Lucie Joesph Birmingham, CNA 11/21/23 [phone]

## 2023-11-21 NOTE — ED Notes (Signed)
 3 UA tubes sent to lab at this time in case of need for.    Christy Barnett, [place] 11/21/23 [phone]

## 2023-11-21 NOTE — ED Notes (Signed)
 Pt reports no hx HTN, but for last month reports elevated BPs.   Connell Jaeger, RN 11/21/23 [phone]

## 2023-12-28 ENCOUNTER — Emergency Department (HOSPITAL_COMMUNITY): Payer: MEDICAID

## 2023-12-28 ENCOUNTER — Observation Stay (HOSPITAL_COMMUNITY)
Admission: EM | Admit: 2023-12-28 | Discharge: 2023-12-31 | DRG: 897 | Disposition: A | Payer: MEDICAID | Attending: Emergency Medicine | Admitting: Emergency Medicine

## 2023-12-28 ENCOUNTER — Other Ambulatory Visit: Payer: Self-pay

## 2023-12-28 DIAGNOSIS — Z720 Tobacco use: Secondary | ICD-10-CM | POA: Diagnosis not present

## 2023-12-28 DIAGNOSIS — F1193 Opioid use, unspecified with withdrawal: Secondary | ICD-10-CM

## 2023-12-28 DIAGNOSIS — K209 Esophagitis, unspecified without bleeding: Secondary | ICD-10-CM

## 2023-12-28 DIAGNOSIS — E860 Dehydration: Secondary | ICD-10-CM

## 2023-12-28 DIAGNOSIS — R030 Elevated blood-pressure reading, without diagnosis of hypertension: Secondary | ICD-10-CM | POA: Diagnosis not present

## 2023-12-28 DIAGNOSIS — E872 Acidosis, unspecified: Secondary | ICD-10-CM | POA: Diagnosis not present

## 2023-12-28 DIAGNOSIS — A419 Sepsis, unspecified organism: Principal | ICD-10-CM

## 2023-12-28 DIAGNOSIS — E876 Hypokalemia: Secondary | ICD-10-CM

## 2023-12-28 DIAGNOSIS — R6510 Systemic inflammatory response syndrome (SIRS) of non-infectious origin without acute organ dysfunction: Secondary | ICD-10-CM | POA: Diagnosis not present

## 2023-12-28 DIAGNOSIS — I10 Essential (primary) hypertension: Secondary | ICD-10-CM

## 2023-12-28 DIAGNOSIS — R112 Nausea with vomiting, unspecified: Secondary | ICD-10-CM | POA: Diagnosis not present

## 2023-12-28 LAB — CBC WITH DIFFERENTIAL/PLATELET
Abs Immature Granulocytes: 0.03 K/uL (ref 0.00–0.07)
Basophils Absolute: 0.1 K/uL (ref 0.0–0.1)
Basophils Relative: 1 %
Eosinophils Absolute: 0 K/uL (ref 0.0–0.5)
Eosinophils Relative: 0 %
HCT: 45 % (ref 36.0–46.0)
Hemoglobin: 14.9 g/dL (ref 12.0–15.0)
Immature Granulocytes: 0 %
Lymphocytes Relative: 8 %
Lymphs Abs: 0.9 K/uL (ref 0.7–4.0)
MCH: 28.3 pg (ref 26.0–34.0)
MCHC: 33.1 g/dL (ref 30.0–36.0)
MCV: 85.6 fL (ref 80.0–100.0)
Monocytes Absolute: 0.4 K/uL (ref 0.1–1.0)
Monocytes Relative: 4 %
Neutro Abs: 10.6 K/uL — ABNORMAL HIGH (ref 1.7–7.7)
Neutrophils Relative %: 87 %
Platelets: 383 K/uL (ref 150–400)
RBC: 5.26 MIL/uL — ABNORMAL HIGH (ref 3.87–5.11)
RDW: 13.2 % (ref 11.5–15.5)
WBC: 12 K/uL — ABNORMAL HIGH (ref 4.0–10.5)
nRBC: 0 % (ref 0.0–0.2)

## 2023-12-28 LAB — URINALYSIS, ROUTINE W REFLEX MICROSCOPIC
Bacteria, UA: NONE SEEN
Bilirubin Urine: NEGATIVE
Glucose, UA: NEGATIVE mg/dL
Hgb urine dipstick: NEGATIVE
Ketones, ur: 20 mg/dL — AB
Leukocytes,Ua: NEGATIVE
Nitrite: NEGATIVE
Protein, ur: 30 mg/dL — AB
Specific Gravity, Urine: 1.009 (ref 1.005–1.030)
pH: 8 (ref 5.0–8.0)

## 2023-12-28 LAB — RESP PANEL BY RT-PCR (RSV, FLU A&B, COVID)  RVPGX2
Influenza A by PCR: NEGATIVE
Influenza B by PCR: NEGATIVE
Resp Syncytial Virus by PCR: NEGATIVE
SARS Coronavirus 2 by RT PCR: NEGATIVE

## 2023-12-28 LAB — URINE DRUG SCREEN
Amphetamines: NEGATIVE
Barbiturates: NEGATIVE
Benzodiazepines: NEGATIVE
Cocaine: NEGATIVE
Fentanyl: POSITIVE — AB
Methadone Scn, Ur: NEGATIVE
Opiates: NEGATIVE
Tetrahydrocannabinol: NEGATIVE

## 2023-12-28 LAB — COMPREHENSIVE METABOLIC PANEL WITH GFR
ALT: 18 U/L (ref 0–44)
AST: 32 U/L (ref 15–41)
Albumin: 4.8 g/dL (ref 3.5–5.0)
Alkaline Phosphatase: 131 U/L — ABNORMAL HIGH (ref 38–126)
Anion gap: 18 — ABNORMAL HIGH (ref 5–15)
BUN: 7 mg/dL (ref 6–20)
CO2: 28 mmol/L (ref 22–32)
Calcium: 10.2 mg/dL (ref 8.9–10.3)
Chloride: 98 mmol/L (ref 98–111)
Creatinine, Ser: 0.62 mg/dL (ref 0.44–1.00)
GFR, Estimated: >60 mL/min (ref >60)
Glucose, Bld: 85 mg/dL (ref 70–99)
Potassium: 3.1 mmol/L — ABNORMAL LOW (ref 3.5–5.1)
Sodium: 144 mmol/L (ref 135–145)
Total Bilirubin: 0.4 mg/dL (ref 0.0–1.2)
Total Protein: 8.3 g/dL — ABNORMAL HIGH (ref 6.5–8.1)

## 2023-12-28 LAB — MAGNESIUM: Magnesium: 1.5 mg/dL — ABNORMAL LOW (ref 1.7–2.4)

## 2023-12-28 LAB — I-STAT CG4 LACTIC ACID, ED
Lactic Acid, Venous: 3.1 mmol/L (ref 0.5–1.9)
Lactic Acid, Venous: 2.8 mmol/L (ref 0.5–1.9)

## 2023-12-28 MED ORDER — LABETALOL HCL 5 MG/ML IV SOLN
10.0000 mg | INTRAVENOUS | Status: AC | PRN
  Administered 2023-12-29 (×5): 10 mg via INTRAVENOUS
  Filled 2023-12-28 (×5): qty 4

## 2023-12-28 MED ORDER — ACETAMINOPHEN 650 MG RE SUPP
650.0000 mg | Freq: Once | RECTAL | Status: AC
  Administered 2023-12-28: 650 mg via RECTAL
  Filled 2023-12-28: qty 1

## 2023-12-28 MED ORDER — LABETALOL HCL 5 MG/ML IV SOLN
10.0000 mg | Freq: Once | INTRAVENOUS | Status: AC
  Administered 2023-12-28: 10 mg via INTRAVENOUS
  Filled 2023-12-28: qty 4

## 2023-12-28 MED ORDER — SODIUM CHLORIDE 0.9 % IV SOLN
2.0000 g | Freq: Once | INTRAVENOUS | Status: AC
  Administered 2023-12-28: 2 g via INTRAVENOUS
  Filled 2023-12-28: qty 12.5

## 2023-12-28 MED ORDER — CHLORHEXIDINE GLUCONATE CLOTH 2 % EX PADS
6.0000 | MEDICATED_PAD | Freq: Every day | CUTANEOUS | Status: DC
  Administered 2023-12-29 – 2023-12-30 (×2): 6 via TOPICAL

## 2023-12-28 MED ORDER — LORAZEPAM 2 MG/ML IJ SOLN
1.0000 mg | INTRAMUSCULAR | Status: DC | PRN
  Administered 2023-12-29 (×3): 1 mg via INTRAVENOUS
  Filled 2023-12-28 (×3): qty 1

## 2023-12-28 MED ORDER — LACTATED RINGERS IV BOLUS
1000.0000 mL | Freq: Once | INTRAVENOUS | Status: AC
  Administered 2023-12-28: 1000 mL via INTRAVENOUS

## 2023-12-28 MED ORDER — CLONIDINE HCL 0.1 MG PO TABS
0.1000 mg | ORAL_TABLET | Freq: Once | ORAL | Status: AC
  Administered 2023-12-28: 0.1 mg via ORAL
  Filled 2023-12-28: qty 1

## 2023-12-28 MED ORDER — METOCLOPRAMIDE HCL 5 MG/ML IJ SOLN
10.0000 mg | Freq: Once | INTRAMUSCULAR | Status: AC
  Administered 2023-12-28: 10 mg via INTRAVENOUS
  Filled 2023-12-28: qty 2

## 2023-12-28 MED ORDER — ORAL CARE MOUTH RINSE: 15.0000 mL | OROMUCOSAL | Status: DC | PRN

## 2023-12-28 MED ORDER — CLONIDINE HCL 0.1 MG PO TABS
0.1000 mg | ORAL_TABLET | Freq: Once | ORAL | Status: AC
  Administered 2023-12-29: 0.1 mg via ORAL
  Filled 2023-12-28: qty 1

## 2023-12-28 MED ORDER — ACETAMINOPHEN 325 MG PO TABS
650.0000 mg | ORAL_TABLET | Freq: Four times a day (QID) | ORAL | Status: DC | PRN
  Administered 2023-12-29 – 2023-12-30 (×3): 650 mg via ORAL
  Filled 2023-12-28 (×3): qty 2

## 2023-12-28 MED ORDER — ONDANSETRON HCL 4 MG/2ML IJ SOLN
4.0000 mg | Freq: Once | INTRAMUSCULAR | Status: AC
  Administered 2023-12-28: 4 mg via INTRAVENOUS
  Filled 2023-12-28: qty 2

## 2023-12-28 MED ORDER — IOHEXOL 300 MG/ML  SOLN
100.0000 mL | Freq: Once | INTRAMUSCULAR | Status: AC | PRN
  Administered 2023-12-28: 100 mL via INTRAVENOUS

## 2023-12-28 MED ORDER — ACETAMINOPHEN 650 MG RE SUPP: 650.0000 mg | Freq: Four times a day (QID) | RECTAL | Status: DC | PRN

## 2023-12-28 MED ORDER — LORAZEPAM 2 MG/ML IJ SOLN
0.5000 mg | Freq: Once | INTRAMUSCULAR | Status: AC
  Administered 2023-12-28: 0.5 mg via INTRAVENOUS
  Filled 2023-12-28: qty 1

## 2023-12-28 MED ORDER — SODIUM CHLORIDE 0.9 % IV BOLUS
1000.0000 mL | Freq: Once | INTRAVENOUS | Status: AC
  Administered 2023-12-28: 1000 mL via INTRAVENOUS

## 2023-12-28 MED ORDER — POTASSIUM CHLORIDE 10 MEQ/100ML IV SOLN
10.0000 meq | Freq: Once | INTRAVENOUS | Status: AC
  Administered 2023-12-28: 10 meq via INTRAVENOUS
  Filled 2023-12-28: qty 100

## 2023-12-28 MED ORDER — VANCOMYCIN HCL IN DEXTROSE 1-5 GM/200ML-% IV SOLN: 1000.0000 mg | Freq: Once | INTRAVENOUS | Status: DC

## 2023-12-28 MED ORDER — VANCOMYCIN HCL 1500 MG/300ML IV SOLN
1500.0000 mg | Freq: Once | INTRAVENOUS | Status: AC
  Administered 2023-12-28: 1500 mg via INTRAVENOUS
  Filled 2023-12-28 (×2): qty 300

## 2023-12-28 MED ORDER — METRONIDAZOLE 500 MG/100ML IV SOLN
500.0000 mg | Freq: Once | INTRAVENOUS | Status: AC
  Administered 2023-12-28: 500 mg via INTRAVENOUS
  Filled 2023-12-28: qty 100

## 2023-12-28 MED ORDER — MAGNESIUM SULFATE 2 GM/50ML IV SOLN
2.0000 g | Freq: Once | INTRAVENOUS | Status: AC
  Administered 2023-12-28: 2 g via INTRAVENOUS
  Filled 2023-12-28: qty 50

## 2023-12-28 MED ORDER — ONDANSETRON 4 MG PO TBDP
4.0000 mg | ORAL_TABLET | Freq: Once | ORAL | Status: AC
  Administered 2023-12-28: 4 mg via ORAL
  Filled 2023-12-28: qty 1

## 2023-12-28 MED ORDER — DIPHENHYDRAMINE HCL 50 MG/ML IJ SOLN
25.0000 mg | Freq: Once | INTRAMUSCULAR | Status: AC
  Administered 2023-12-28: 25 mg via INTRAVENOUS
  Filled 2023-12-28: qty 1

## 2023-12-28 MED ORDER — POTASSIUM CHLORIDE 10 MEQ/100ML IV SOLN
10.0000 meq | INTRAVENOUS | Status: AC
  Administered 2023-12-29 (×4): 10 meq via INTRAVENOUS
  Filled 2023-12-28 (×4): qty 100

## 2023-12-28 NOTE — ED Notes (Signed)
 Pt. I-stat CG4 Lactic Acid result 2.78, EDP, [place],MD made aware.

## 2023-12-28 NOTE — Progress Notes (Signed)
[facility] is following Code Sepsis 

## 2023-12-28 NOTE — ED Triage Notes (Addendum)
 PT arrives via EMS from home. Pt has complaints of abdominal pain that began around midnight. Pt urinated on floor upon arrival into ER.   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   EMS VS BP 162/120 P- 130 O2 98%  Glucose 119mg /dL

## 2023-12-28 NOTE — ED Provider Notes (Signed)
 [facility] Provider Note   CSN: [id number] Arrival date & time: 12/28/23  1409     Patient presents with: Abdominal Pain and Withdrawal   Christy Barnett is a 53 y.o. female.   Pt is a 53 yo female with pmhx significant for HTN, hepatitis C, and polysubstance abuse.  She presents to the ED today with n/v and abd pain.  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  She is here with her daughter who has similar sx.  She urinated on the floor upon arrival.        Prior to Admission medications   Medication Sig Start Date End Date Taking? Authorizing Provider  acetaminophen  (TYLENOL ) 500 MG tablet Take 2 tablets (1,000 mg total) by mouth 4 (four) times daily. Patient not taking: Reported on 12/28/2023 11/15/23   Paola Dreama SAILOR, MD  amLODipine  (NORVASC ) 5 MG tablet Take 1 tablet (5 mg total) by mouth daily. Patient not taking: Reported on 11/14/2023 07/20/23   Midge Golas, MD  amLODipine  (NORVASC ) 5 MG tablet Take 1 tablet (5 mg total) by mouth daily. Patient not taking: Reported on 12/28/2023 11/16/23   Vicci Burnard SAUNDERS, PA-C  ibuprofen  (ADVIL ) 600 MG tablet Take 1 tablet (600 mg total) by mouth 4 (four) times daily. Patient not taking: Reported on 12/28/2023 11/15/23   Paola Dreama SAILOR, MD  methocarbamol  (ROBAXIN ) 750 MG tablet Take 1 tablet (750 mg total) by mouth 4 (four) times daily. Patient not taking: Reported on 12/28/2023 11/15/23   Paola Dreama SAILOR, MD  oxyCODONE  (ROXICODONE ) 5 MG immediate release tablet Take 1 tablet (5 mg total) by mouth every 4 (four) hours as needed. Patient not taking: Reported on 12/28/2023 11/15/23 11/14/24  Paola Dreama SAILOR, MD  polyethylene glycol powder (MIRALAX ) 17 GM/SCOOP powder Take 17 g by mouth daily. Dissolve 1 capful (17g) in 4-8 ounces of liquid and take by mouth daily. Patient not taking: Reported on 12/28/2023 11/15/23   Paola Dreama SAILOR, MD  potassium chloride  SA (K-DUR,KLOR-CON ) 20 MEQ tablet  Take 1 tablet (20 mEq total) by mouth 2 (two) times daily as needed (with lasix ). Patient not taking: Reported on 12/02/2018 08/02/17 12/02/18  Vicenta Maduro, FNP    Allergies: Patient has no known allergies.    Review of Systems  Gastrointestinal:  Positive for abdominal pain, nausea and vomiting.  All other systems reviewed and are negative.   Updated Vital Signs BP (!) 222/82   Pulse (!) 123   Temp 99.7 F (37.6 C) (Oral)   Resp 20   SpO2 98%   Physical Exam Vitals and nursing note reviewed.  Constitutional:      Appearance: She is well-developed. She is obese.  HENT:     Head: Normocephalic and atraumatic.     Mouth/Throat:     Mouth: Mucous membranes are dry.  Eyes:     Extraocular Movements: Extraocular movements intact.     Pupils: Pupils are equal, round, and reactive to light.  Cardiovascular:     Rate and Rhythm: Regular rhythm. Tachycardia present.     Heart sounds: Normal heart sounds.  Pulmonary:     Effort: Pulmonary effort is normal.     Breath sounds: Normal breath sounds.  Abdominal:     General: Abdomen is flat. Bowel sounds are normal.     Palpations: Abdomen is soft.     Tenderness: There is generalized abdominal tenderness.  Skin:    General: Skin is warm.  Capillary Refill: Capillary refill takes less than 2 seconds.  Neurological:     General: No focal deficit present.     Mental Status: She is alert and oriented to person, place, and time.  Psychiatric:        Mood and Affect: Mood normal.        Behavior: Behavior normal.     (all labs ordered are listed, but only abnormal results are displayed) Labs Reviewed  CBC WITH DIFFERENTIAL/PLATELET - Abnormal; Notable for the following components:      Result Value   WBC 12.0 (*)    RBC 5.26 (*)    Neutro Abs 10.6 (*)    All other components within normal limits  COMPREHENSIVE METABOLIC PANEL WITH GFR - Abnormal; Notable for the following components:   Potassium 3.1 (*)    Total  Protein 8.3 (*)    Alkaline Phosphatase 131 (*)    Anion gap 18 (*)    All other components within normal limits  URINALYSIS, ROUTINE W REFLEX MICROSCOPIC - Abnormal; Notable for the following components:   Color, Urine STRAW (*)    Ketones, ur 20 (*)    Protein, ur 30 (*)    All other components within normal limits  URINE DRUG SCREEN - Abnormal; Notable for the following components:   Fentanyl  POSITIVE (*)    All other components within normal limits  MAGNESIUM  - Abnormal; Notable for the following components:   Magnesium  1.5 (*)    All other components within normal limits  I-STAT CG4 LACTIC ACID, ED - Abnormal; Notable for the following components:   Lactic Acid, Venous 3.1 (*)    All other components within normal limits  I-STAT CG4 LACTIC ACID, ED - Abnormal; Notable for the following components:   Lactic Acid, Venous 2.8 (*)    All other components within normal limits  RESP PANEL BY RT-PCR (RSV, FLU A&B, COVID)  RVPGX2  CULTURE, BLOOD (ROUTINE X 2)  CULTURE, BLOOD (ROUTINE X 2)    EKG: EKG Interpretation Date/Time:  Tuesday December 28 2023 15:16:48 EST Ventricular Rate:  100 PR Interval:  134 QRS Duration:  89 QT Interval:  384 QTC Calculation: 496 R Axis:   59  Text Interpretation: Sinus tachycardia Left ventricular hypertrophy Borderline prolonged QT interval No significant change since last tracing Confirmed by Dean Clarity [phone]) on 12/28/2023 4:43:59 PM  Radiology: CT CHEST ABDOMEN PELVIS W CONTRAST Result Date: 12/28/2023 EXAM: CT CHEST, ABDOMEN AND PELVIS WITH CONTRAST 12/28/2023 09:26:04 PM TECHNIQUE: CT of the chest, abdomen and pelvis was performed with the administration of 100 mL of iohexol  (OMNIPAQUE ) 300 MG/ML solution. Multiplanar reformatted images are provided for review. Automated exposure control, iterative reconstruction, and/or weight based adjustment of the mA/kV was utilized to reduce the radiation dose to as low as reasonably achievable.  COMPARISON: CT abdomen and pelvis 11/14/2023. CLINICAL HISTORY: Sepsis. FINDINGS: CHEST: MEDIASTINUM AND LYMPH NODES: Heart and pericardium are unremarkable. The central airways are clear. Small hiatal hernia. Distal esophageal wall thickening suggests esophagitis, possibly related to reflux. No mediastinal, hilar or axillary lymphadenopathy. LUNGS AND PLEURA: No focal consolidation or pulmonary edema. No pleural effusion or pneumothorax. ABDOMEN AND PELVIS: LIVER: The liver is unremarkable. GALLBLADDER AND BILE DUCTS: Prior cholecystectomy. No biliary ductal dilatation. SPLEEN: No acute abnormality. PANCREAS: No acute abnormality. ADRENAL GLANDS: No acute abnormality. KIDNEYS, URETERS AND BLADDER: No stones in the kidneys or ureters. No hydronephrosis. Mild bilateral perinephric stranding. No periureteral stranding. Urinary bladder is unremarkable. GI AND BOWEL:  Stomach demonstrates no acute abnormality. There is no bowel obstruction. REPRODUCTIVE ORGANS: No acute abnormality. PERITONEUM AND RETROPERITONEUM: No ascites. No free air. VASCULATURE: Aorta is normal in caliber. ABDOMINAL AND PELVIS LYMPH NODES: No lymphadenopathy. REPRODUCTIVE ORGANS: No acute abnormality. BONES AND SOFT TISSUES: No acute osseous abnormality. No focal soft tissue abnormality. IMPRESSION: 1. Distal esophageal wall thickening suggests esophagitis, possibly related to reflux. 2. Small hiatal hernia. 3. No acute findings in the abdomen or pelvis. Electronically signed by: Franky Crease MD 12/28/2023 09:32 PM EST RP Workstation: [id number]   DG Chest Portable 1 View Result Date: 12/28/2023 CLINICAL DATA:  Shortness of breath. EXAM: PORTABLE CHEST 1 VIEW COMPARISON:  Chest radiograph dated 03/13/2022. FINDINGS: The heart size and mediastinal contours are within normal limits. Both lungs are clear. The visualized skeletal structures are unremarkable. IMPRESSION: No active disease. Electronically Signed   By: Vanetta Chou M.D.   On:  12/28/2023 18:08     Procedures   Medications Ordered in the ED  vancomycin  (VANCOREADY) IVPB 1500 mg/300 mL (has no administration in time range)  labetalol  (NORMODYNE ) injection 10 mg (has no administration in time range)  lactated ringers  bolus 1,000 mL (0 mLs Intravenous Stopped 12/28/23 1700)  cloNIDine  (CATAPRES ) tablet 0.1 mg (0.1 mg Oral Given 12/28/23 1508)  ondansetron  (ZOFRAN -ODT) disintegrating tablet 4 mg (4 mg Oral Given 12/28/23 1509)  ondansetron  (ZOFRAN ) injection 4 mg (4 mg Intravenous Given 12/28/23 1801)  sodium chloride  0.9 % bolus 1,000 mL (0 mLs Intravenous Stopped 12/28/23 1938)  potassium chloride  10 mEq in 100 mL IVPB (0 mEq Intravenous Stopped 12/28/23 1902)  acetaminophen  (TYLENOL ) suppository 650 mg (650 mg Rectal Given 12/28/23 1847)  magnesium  sulfate IVPB 2 g 50 mL (0 g Intravenous Stopped 12/28/23 1938)  ceFEPIme  (MAXIPIME ) 2 g in sodium chloride  0.9 % 100 mL IVPB (0 g Intravenous Stopped 12/28/23 1957)  metroNIDAZOLE  (FLAGYL ) IVPB 500 mg (0 mg Intravenous Stopped 12/28/23 2114)  sodium chloride  0.9 % bolus 1,000 mL (0 mLs Intravenous Stopped 12/28/23 2029)  metoCLOPramide  (REGLAN ) injection 10 mg (10 mg Intravenous Given 12/28/23 2039)  diphenhydrAMINE  (BENADRYL ) injection 25 mg (25 mg Intravenous Given 12/28/23 2039)  iohexol  (OMNIPAQUE ) 300 MG/ML solution 100 mL (100 mLs Intravenous Contrast Given 12/28/23 2111)                                    Medical Decision Making Amount and/or Complexity of Data Reviewed Labs: ordered. Radiology: ordered.  Risk OTC drugs. Prescription drug management. Decision regarding hospitalization.   This patient presents to the ED for concern of n/v and abd pain, this involves an extensive number of treatment options, and is a complaint that carries with it a high risk of complications and morbidity.  The differential diagnosis includes infection, w/dr, electrolyte abn   Co morbidities that complicate the patient  evaluation  HTN, hepatitis C, and polysubstance abuse   Additional history obtained:  Additional history obtained from epic chart review External records from outside source obtained and reviewed including EMS report   Lab Tests:  I Ordered, and personally interpreted labs.  The pertinent results include:  cbc with wbc sl elevateda t 12; cmp with k low at 3.1; mg low at 1.5; uds + fentanyl , ua + ketones/protein; lactic initially 3.1 and down to 2.8 after fluids; covid/flu/rsv neg   Imaging Studies ordered:  I ordered imaging studies including cxr, ct chest/abd/pelvis I independently visualized and interpreted  imaging which showed  CXR: No active disease.  CT chest/abd/pelvis: 1. Distal esophageal wall thickening suggests esophagitis, possibly related to  reflux.  2. Small hiatal hernia.  3. No acute findings in the abdomen or pelvis.   I agree with the radiologist interpretation   Cardiac Monitoring:  The patient was maintained on a cardiac monitor.  I personally viewed and interpreted the cardiac monitored which showed an underlying rhythm of: st   Medicines ordered and prescription drug management:  I ordered medication including ivfs/iv zofran /reglan /benadryl /flagyl /maxipime /vancomycin   for sx  Reevaluation of the patient after these medicines showed that the patient improved I have reviewed the patients home medicines and have made adjustments as needed   Test Considered:  ct   Critical Interventions:  Code sepsis   Consultations Obtained:  I requested consultation with the hospitalist (Dr. Marcene),  and discussed lab and imaging findings as well as pertinent plan -he will admit   Problem List / ED Course:  Hypomagnesemia/hypokalemia:  iv k and mg given Sepsis:  low grade fever initially, but fever has spiked.  Pt's daughter (also here with opiate w/dr sx) has a fever also.  Code sepsis called and pt given ivfs and iv abx.  Etiology unclear, so ct  chest/abd/pelvis done which did not reveal etiology.  Lactic is improving. HTN:  hx HTN.  I doubt pt has been taking her meds.  She is given a dose of labetalol . Opiate withdrawal:  ivfs/iv nausea meds ordered.  She did not want suboxone.   Reevaluation:  After the interventions noted above, I reevaluated the patient and found that they have :improved   Social Determinants of Health:  Lives at home   Dispostion:  After consideration of the diagnostic results and the patients response to treatment, I feel that the patent would benefit from admission.  CRITICAL CARE Performed by: Mliss Boyers   Total critical care time: 30 minutes  Critical care time was exclusive of separately billable procedures and treating other patients.  Critical care was necessary to treat or prevent imminent or life-threatening deterioration.  Critical care was time spent personally by me on the following activities: development of treatment plan with patient and/or surrogate as well as nursing, discussions with consultants, evaluation of patient's response to treatment, examination of patient, obtaining history from patient or surrogate, ordering and performing treatments and interventions, ordering and review of laboratory studies, ordering and review of radiographic studies, pulse oximetry and re-evaluation of patient's condition.        Final diagnoses:  Sepsis, due to unspecified organism, unspecified whether acute organ dysfunction present (HCC)  Hypokalemia  Hypomagnesemia  Opiate withdrawal (HCC)  Dehydration  Nausea and vomiting, unspecified vomiting type  Hypertension, unspecified type    ED Discharge Orders     None          Boyers Mliss, MD 12/28/23 2242

## 2023-12-28 NOTE — ED Provider Triage Note (Signed)
 Emergency Medicine Provider Triage Evaluation Note  Christy Barnett , a 53 y.o. female  was evaluated in triage.  Pt complains of generalized abdominal pain, body aches, diaphoresis.  Review of Systems  Positive: Abdominal pain, body aches, diaphoresis Negative: Headache  Physical Exam  BP (!) 168/97 (BP Location: Left Arm)   Pulse (!) 118   Temp 100.2 F (37.9 C) (Oral)   Resp 20   SpO2 98%  Gen:   Awake, diaphoretic, in acute distress, agitated Resp:  Normal effort MSK:   Moves extremities without difficulty   Medical Decision Making  Medically screening exam initiated at 2:48 PM.  Appropriate orders placed.  Christy Barnett was informed that the remainder of the evaluation will be completed by another provider, this initial triage assessment does not replace that evaluation, and the importance of remaining in the ED until their evaluation is complete.  Patient presents with another family member with similar symptoms, denies any headache, endorses recent heroin use as recently as yesterday, now endorsing withdrawal symptoms with lacrimation, generalized abdominal pain, agitation, muscle aches, diaphoresis.  Initial workup for treatment of withdrawal symptoms initiated from triage.   Christy Agent, MD 12/28/23 [phone]

## 2023-12-28 NOTE — H&P (Signed)
 History and Physical      Christy Barnett FMW:[id number] DOB: 1970-12-30 DOA: 12/28/2023; DOS: 12/28/2023  [facility]P: [facility] *** Patient coming from: home ***  I have personally briefly reviewed patient's old medical records in [facility]  Chief Complaint: ***  HPI: Christy Barnett is a 52 y.o. female with medical history significant for *** who is admitted to [facility] on 12/28/2023 with *** after presenting from home*** to [facility] ED complaining of ***.   ***        ***  ED Course:  Vital signs in the ED were notable for the following: ***  Labs were notable for the following: ***  Per my interpretation, EKG in ED demonstrated the following:  ***  Imaging in the ED, per corresponding formal radiology read, was notable for the following: ***  While in the ED, the following were administered: ***  Subsequently, the patient was admitted  ***  ***red   Review of Systems: As per HPI otherwise 10 point review of systems negative.   Past Medical History:  Diagnosis Date   Asthma    Bronchitis    Hepatitis C    Heroin abuse (HCC)     Past Surgical History:  Procedure Laterality Date   CESAREAN SECTION     CHOLECYSTECTOMY N/A 11/15/2023   Procedure: LAPAROSCOPIC CHOLECYSTECTOMY;  Surgeon: Paola Dreama SAILOR, MD;  Location: [facility] ORS;  Service: General;  Laterality: N/A;    Social History:  reports that she has been smoking cigarettes. She has never used smokeless tobacco. She reports that she does not currently use drugs. She reports that she does not drink alcohol.   No Known Allergies  Family History  Problem Relation Age of Onset   COPD Mother    Heart disease Father    Breast cancer Paternal Aunt    Breast cancer Paternal Aunt    Breast cancer Paternal Aunt    Heart disease Paternal Grandfather     Family history reviewed and not pertinent ***   Prior to Admission medications   Medication Sig Start Date End Date Taking?  Authorizing Provider  acetaminophen  (TYLENOL ) 500 MG tablet Take 2 tablets (1,000 mg total) by mouth 4 (four) times daily. Patient not taking: Reported on 12/28/2023 11/15/23   Paola Dreama SAILOR, MD  amLODipine  (NORVASC ) 5 MG tablet Take 1 tablet (5 mg total) by mouth daily. Patient not taking: Reported on 11/14/2023 07/20/23   Midge Golas, MD  amLODipine  (NORVASC ) 5 MG tablet Take 1 tablet (5 mg total) by mouth daily. Patient not taking: Reported on 12/28/2023 11/16/23   Vicci Burnard SAUNDERS, PA-C  ibuprofen  (ADVIL ) 600 MG tablet Take 1 tablet (600 mg total) by mouth 4 (four) times daily. Patient not taking: Reported on 12/28/2023 11/15/23   Paola Dreama SAILOR, MD  methocarbamol  (ROBAXIN ) 750 MG tablet Take 1 tablet (750 mg total) by mouth 4 (four) times daily. Patient not taking: Reported on 12/28/2023 11/15/23   Paola Dreama SAILOR, MD  oxyCODONE  (ROXICODONE ) 5 MG immediate release tablet Take 1 tablet (5 mg total) by mouth every 4 (four) hours as needed. Patient not taking: Reported on 12/28/2023 11/15/23 11/14/24  Paola Dreama SAILOR, MD  polyethylene glycol powder (MIRALAX ) 17 GM/SCOOP powder Take 17 g by mouth daily. Dissolve 1 capful (17g) in 4-8 ounces of liquid and take by mouth daily. Patient not taking: Reported on 12/28/2023 11/15/23   Paola Dreama SAILOR, MD  potassium chloride  SA (K-DUR,KLOR-CON ) 20 MEQ tablet Take  1 tablet (20 mEq total) by mouth 2 (two) times daily as needed (with lasix ). Patient not taking: Reported on 12/02/2018 08/02/17 12/02/18  Vicenta Maduro, FNP     Objective    Physical Exam: Vitals:   12/28/23 2200 12/28/23 2229 12/28/23 2258 12/28/23 2300  BP: (!) 210/96 (!) 222/82    Pulse: (!) 133 (!) 123  (!) 118  Resp:  20    Temp:   99.4 F (37.4 C)   TempSrc:   Oral   SpO2: 100% 98%  97%    General: appears to be stated age; alert, oriented Skin: warm, dry, no rash Head:  AT/[place] Mouth:  Oral mucosa membranes appear moist, normal dentition Neck: supple; trachea  midline Heart:  RRR; did not appreciate any M/R/G Lungs: CTAB, did not appreciate any wheezes, rales, or rhonchi Abdomen: + BS; soft, ND, NT Vascular: 2+ pedal pulses b/l; 2+ radial pulses b/l Extremities: no peripheral edema, no muscle wasting   *** Neuro: strength and sensation intact in upper and lower extremities b/l  *** Neuro: 5/5 strength of the proximal and distal flexors and extensors of the upper and lower extremities bilaterally; sensation intact in upper and lower extremities b/l; cranial nerves II through XII grossly intact; no pronator drift; no evidence suggestive of slurred speech, dysarthria, or facial droop; Normal muscle tone. No tremors. *** Neuro: In the setting of the patient's current mental status and associated inability to follow instructions, unable to perform full neurologic exam at this time.  As such, assessment of strength, sensation, and cranial nerves is limited at this time. Patient noted to spontaneously move all 4 extremities. No tremors.  ***       Labs on Admission: I have personally reviewed following labs and imaging studies  CBC: Recent Labs  Lab 12/28/23 1554  WBC 12.0*  NEUTROABS 10.6*  HGB 14.9  HCT 45.0  MCV 85.6  PLT 383   Basic Metabolic Panel: Recent Labs  Lab 12/28/23 1554 12/28/23 1730  NA 144  --   K 3.1*  --   CL 98  --   CO2 28  --   GLUCOSE 85  --   BUN 7  --   CREATININE 0.62  --   CALCIUM 10.2  --   MG  --  1.5*   GFR: CrCl cannot be calculated (Unknown ideal weight.). Liver Function Tests: Recent Labs  Lab 12/28/23 1554  AST 32  ALT 18  ALKPHOS 131*  BILITOT 0.4  PROT 8.3*  ALBUMIN 4.8   No results for input(s): LIPASE, AMYLASE in the last 168 hours. No results for input(s): AMMONIA in the last 168 hours. Coagulation Profile: No results for input(s): INR, PROTIME in the last 168 hours. Cardiac Enzymes: No results for input(s): CKTOTAL, CKMB, CKMBINDEX, TROPONINI in the last  168 hours. BNP (last 3 results) No results for input(s): PROBNP in the last 8760 hours. HbA1C: No results for input(s): HGBA1C in the last 72 hours. CBG: No results for input(s): GLUCAP in the last 168 hours. Lipid Profile: No results for input(s): CHOL, HDL, LDLCALC, TRIG, CHOLHDL, LDLDIRECT in the last 72 hours. Thyroid Function Tests: No results for input(s): TSH, T4TOTAL, FREET4, T3FREE, THYROIDAB in the last 72 hours. Anemia Panel: No results for input(s): VITAMINB12, FOLATE, FERRITIN, TIBC, IRON, RETICCTPCT in the last 72 hours. Urine analysis:    Component Value Date/Time   COLORURINE STRAW (A) 12/28/2023 1658   APPEARANCEUR CLEAR 12/28/2023 1658   LABSPEC 1.009 12/28/2023 1658  PHURINE 8.0 12/28/2023 1658   GLUCOSEU NEGATIVE 12/28/2023 1658   HGBUR NEGATIVE 12/28/2023 1658   BILIRUBINUR NEGATIVE 12/28/2023 1658   BILIRUBINUR neg 07/16/2017 1227   KETONESUR 20 (A) 12/28/2023 1658   PROTEINUR 30 (A) 12/28/2023 1658   UROBILINOGEN 1.0 07/16/2017 1227   UROBILINOGEN 1.0 09/17/2010 0254   NITRITE NEGATIVE 12/28/2023 1658   LEUKOCYTESUR NEGATIVE 12/28/2023 1658    Radiological Exams on Admission: CT CHEST ABDOMEN PELVIS W CONTRAST Result Date: 12/28/2023 EXAM: CT CHEST, ABDOMEN AND PELVIS WITH CONTRAST 12/28/2023 09:26:04 PM TECHNIQUE: CT of the chest, abdomen and pelvis was performed with the administration of 100 mL of iohexol  (OMNIPAQUE ) 300 MG/ML solution. Multiplanar reformatted images are provided for review. Automated exposure control, iterative reconstruction, and/or weight based adjustment of the mA/kV was utilized to reduce the radiation dose to as low as reasonably achievable. COMPARISON: CT abdomen and pelvis 11/14/2023. CLINICAL HISTORY: Sepsis. FINDINGS: CHEST: MEDIASTINUM AND LYMPH NODES: Heart and pericardium are unremarkable. The central airways are clear. Small hiatal hernia. Distal esophageal wall thickening suggests  esophagitis, possibly related to reflux. No mediastinal, hilar or axillary lymphadenopathy. LUNGS AND PLEURA: No focal consolidation or pulmonary edema. No pleural effusion or pneumothorax. ABDOMEN AND PELVIS: LIVER: The liver is unremarkable. GALLBLADDER AND BILE DUCTS: Prior cholecystectomy. No biliary ductal dilatation. SPLEEN: No acute abnormality. PANCREAS: No acute abnormality. ADRENAL GLANDS: No acute abnormality. KIDNEYS, URETERS AND BLADDER: No stones in the kidneys or ureters. No hydronephrosis. Mild bilateral perinephric stranding. No periureteral stranding. Urinary bladder is unremarkable. GI AND BOWEL: Stomach demonstrates no acute abnormality. There is no bowel obstruction. REPRODUCTIVE ORGANS: No acute abnormality. PERITONEUM AND RETROPERITONEUM: No ascites. No free air. VASCULATURE: Aorta is normal in caliber. ABDOMINAL AND PELVIS LYMPH NODES: No lymphadenopathy. REPRODUCTIVE ORGANS: No acute abnormality. BONES AND SOFT TISSUES: No acute osseous abnormality. No focal soft tissue abnormality. IMPRESSION: 1. Distal esophageal wall thickening suggests esophagitis, possibly related to reflux. 2. Small hiatal hernia. 3. No acute findings in the abdomen or pelvis. Electronically signed by: Franky Crease MD 12/28/2023 09:32 PM EST RP Workstation: [id number]   DG Chest Portable 1 View Result Date: 12/28/2023 CLINICAL DATA:  Shortness of breath. EXAM: PORTABLE CHEST 1 VIEW COMPARISON:  Chest radiograph dated 03/13/2022. FINDINGS: The heart size and mediastinal contours are within normal limits. Both lungs are clear. The visualized skeletal structures are unremarkable. IMPRESSION: No active disease. Electronically Signed   By: Vanetta Chou M.D.   On: 12/28/2023 18:08      Assessment/Plan    Principal Problem:   Opioid withdrawal  (HCC)  ***        ***                  ***                  ***                    ***                    ***                   ***                    ***                    ***                    ***                    ***                    ***                    ***                   ***  DVT prophylaxis: SCD's ***  Code Status: Full code*** Family Communication: none*** Disposition Plan: Per Rounding Team Consults called: none***;  Admission status: ***    I SPENT GREATER THAN 75 *** MINUTES IN CLINICAL CARE TIME/MEDICAL DECISION-MAKING IN COMPLETING THIS ADMISSION.     Eva NOVAK Kaylum Shrum DO [facility] From 7PM - 7AM   12/28/2023, 11:09 PM   ***

## 2023-12-29 ENCOUNTER — Encounter (HOSPITAL_COMMUNITY): Payer: Self-pay | Admitting: Internal Medicine

## 2023-12-29 DIAGNOSIS — R6510 Systemic inflammatory response syndrome (SIRS) of non-infectious origin without acute organ dysfunction: Secondary | ICD-10-CM | POA: Diagnosis not present

## 2023-12-29 DIAGNOSIS — R030 Elevated blood-pressure reading, without diagnosis of hypertension: Secondary | ICD-10-CM

## 2023-12-29 DIAGNOSIS — Z720 Tobacco use: Secondary | ICD-10-CM | POA: Diagnosis present

## 2023-12-29 DIAGNOSIS — F1193 Opioid use, unspecified with withdrawal: Secondary | ICD-10-CM

## 2023-12-29 DIAGNOSIS — R112 Nausea with vomiting, unspecified: Secondary | ICD-10-CM | POA: Diagnosis present

## 2023-12-29 DIAGNOSIS — E872 Acidosis, unspecified: Secondary | ICD-10-CM | POA: Diagnosis present

## 2023-12-29 DIAGNOSIS — E876 Hypokalemia: Secondary | ICD-10-CM | POA: Diagnosis present

## 2023-12-29 DIAGNOSIS — K209 Esophagitis, unspecified without bleeding: Secondary | ICD-10-CM | POA: Diagnosis present

## 2023-12-29 LAB — COMPREHENSIVE METABOLIC PANEL WITH GFR
ALT: 16 U/L (ref 0–44)
AST: 31 U/L (ref 15–41)
Albumin: 4.2 g/dL (ref 3.5–5.0)
Alkaline Phosphatase: 116 U/L (ref 38–126)
Anion gap: 14 (ref 5–15)
BUN: 5 mg/dL — ABNORMAL LOW (ref 6–20)
CO2: 26 mmol/L (ref 22–32)
Calcium: 8.9 mg/dL (ref 8.9–10.3)
Chloride: 101 mmol/L (ref 98–111)
Creatinine, Ser: 0.53 mg/dL (ref 0.44–1.00)
GFR, Estimated: >60 mL/min (ref >60)
Glucose, Bld: 98 mg/dL (ref 70–99)
Potassium: 3.6 mmol/L (ref 3.5–5.1)
Sodium: 140 mmol/L (ref 135–145)
Total Bilirubin: 0.5 mg/dL (ref 0.0–1.2)
Total Protein: 7.4 g/dL (ref 6.5–8.1)

## 2023-12-29 LAB — CBC WITH DIFFERENTIAL/PLATELET
Abs Immature Granulocytes: 0.04 K/uL (ref 0.00–0.07)
Basophils Absolute: 0.1 K/uL (ref 0.0–0.1)
Basophils Relative: 1 %
Eosinophils Absolute: 0 K/uL (ref 0.0–0.5)
Eosinophils Relative: 0 %
HCT: 43.9 % (ref 36.0–46.0)
Hemoglobin: 14.4 g/dL (ref 12.0–15.0)
Immature Granulocytes: 0 %
Lymphocytes Relative: 14 %
Lymphs Abs: 1.7 K/uL (ref 0.7–4.0)
MCH: 28.1 pg (ref 26.0–34.0)
MCHC: 32.8 g/dL (ref 30.0–36.0)
MCV: 85.6 fL (ref 80.0–100.0)
Monocytes Absolute: 0.9 K/uL (ref 0.1–1.0)
Monocytes Relative: 7 %
Neutro Abs: 9.7 K/uL — ABNORMAL HIGH (ref 1.7–7.7)
Neutrophils Relative %: 78 %
Platelets: 327 K/uL (ref 150–400)
RBC: 5.13 MIL/uL — ABNORMAL HIGH (ref 3.87–5.11)
RDW: 13.3 % (ref 11.5–15.5)
WBC: 12.4 K/uL — ABNORMAL HIGH (ref 4.0–10.5)
nRBC: 0 % (ref 0.0–0.2)

## 2023-12-29 LAB — APTT: aPTT: 34 s (ref 24–36)

## 2023-12-29 LAB — MRSA NEXT GEN BY PCR, NASAL: MRSA by PCR Next Gen: NOT DETECTED

## 2023-12-29 LAB — LACTIC ACID, PLASMA: Lactic Acid, Venous: 1 mmol/L (ref 0.5–1.9)

## 2023-12-29 LAB — PROTIME-INR
INR: 1.1 (ref 0.8–1.2)
Prothrombin Time: 14.5 s (ref 11.4–15.2)

## 2023-12-29 LAB — MAGNESIUM: Magnesium: 2.2 mg/dL (ref 1.7–2.4)

## 2023-12-29 MED ORDER — NICOTINE 14 MG/24HR TD PT24
14.0000 mg | MEDICATED_PATCH | Freq: Every day | TRANSDERMAL | Status: DC | PRN
  Administered 2023-12-29 – 2023-12-31 (×3): 14 mg via TRANSDERMAL
  Filled 2023-12-29 (×3): qty 1

## 2023-12-29 MED ORDER — IBUPROFEN 200 MG PO TABS
400.0000 mg | ORAL_TABLET | Freq: Four times a day (QID) | ORAL | Status: DC | PRN
  Administered 2023-12-29 – 2023-12-30 (×3): 400 mg via ORAL
  Filled 2023-12-29 (×4): qty 2

## 2023-12-29 MED ORDER — PANTOPRAZOLE SODIUM 40 MG IV SOLR
40.0000 mg | Freq: Two times a day (BID) | INTRAVENOUS | Status: DC
  Administered 2023-12-29 – 2023-12-30 (×4): 40 mg via INTRAVENOUS
  Filled 2023-12-29 (×4): qty 10

## 2023-12-29 MED ORDER — SODIUM CHLORIDE 0.9 % IV SOLN: INTRAVENOUS | Status: AC | PRN

## 2023-12-29 MED ORDER — CLONIDINE HCL 0.1 MG PO TABS: 0.1000 mg | ORAL_TABLET | Freq: Every day | ORAL | Status: DC

## 2023-12-29 MED ORDER — LORAZEPAM 2 MG/ML IJ SOLN: 1.0000 mg | INTRAMUSCULAR | Status: DC | PRN

## 2023-12-29 MED ORDER — CLONIDINE HCL 0.1 MG PO TABS
0.1000 mg | ORAL_TABLET | ORAL | Status: DC
  Administered 2023-12-31: 0.1 mg via ORAL
  Filled 2023-12-29: qty 1

## 2023-12-29 MED ORDER — HYDROXYZINE HCL 25 MG PO TABS
25.0000 mg | ORAL_TABLET | Freq: Once | ORAL | Status: AC
  Administered 2023-12-30: 25 mg via ORAL
  Filled 2023-12-29: qty 1

## 2023-12-29 MED ORDER — CLONIDINE HCL 0.1 MG PO TABS
0.1000 mg | ORAL_TABLET | Freq: Once | ORAL | Status: AC
  Administered 2023-12-29: 0.1 mg via ORAL
  Filled 2023-12-29: qty 1

## 2023-12-29 MED ORDER — TRAMADOL HCL 50 MG PO TABS
50.0000 mg | ORAL_TABLET | Freq: Four times a day (QID) | ORAL | Status: DC | PRN
  Administered 2023-12-29 – 2023-12-30 (×2): 50 mg via ORAL
  Filled 2023-12-29 (×3): qty 1

## 2023-12-29 MED ORDER — PROCHLORPERAZINE EDISYLATE 10 MG/2ML IJ SOLN
5.0000 mg | Freq: Once | INTRAMUSCULAR | Status: AC | PRN
  Administered 2023-12-29: 5 mg via INTRAVENOUS
  Filled 2023-12-29: qty 2

## 2023-12-29 MED ORDER — LOPERAMIDE HCL 2 MG PO CAPS: 2.0000 mg | ORAL_CAPSULE | ORAL | Status: DC | PRN

## 2023-12-29 MED ORDER — CLONIDINE HCL 0.1 MG PO TABS
0.1000 mg | ORAL_TABLET | Freq: Four times a day (QID) | ORAL | Status: AC
  Administered 2023-12-29 – 2023-12-30 (×8): 0.1 mg via ORAL
  Filled 2023-12-29 (×8): qty 1

## 2023-12-29 MED ORDER — NICOTINE POLACRILEX 2 MG MT GUM: 2.0000 mg | CHEWING_GUM | OROMUCOSAL | Status: DC | PRN

## 2023-12-29 MED ORDER — METHOCARBAMOL 500 MG PO TABS
500.0000 mg | ORAL_TABLET | Freq: Three times a day (TID) | ORAL | Status: DC | PRN
  Administered 2023-12-29 – 2023-12-31 (×3): 500 mg via ORAL
  Filled 2023-12-29 (×4): qty 1

## 2023-12-29 MED ORDER — LACTATED RINGERS IV SOLN: INTRAVENOUS | Status: AC

## 2023-12-29 MED ORDER — HYDRALAZINE HCL 20 MG/ML IJ SOLN
20.0000 mg | Freq: Four times a day (QID) | INTRAMUSCULAR | Status: DC | PRN
  Administered 2023-12-29 (×2): 20 mg via INTRAVENOUS
  Filled 2023-12-29 (×2): qty 1

## 2023-12-29 MED ORDER — LORAZEPAM 2 MG/ML IJ SOLN
1.0000 mg | Freq: Once | INTRAMUSCULAR | Status: AC
  Administered 2023-12-29: 1 mg via INTRAVENOUS
  Filled 2023-12-29: qty 1

## 2023-12-29 MED ORDER — ALUM & MAG HYDROXIDE-SIMETH 200-200-20 MG/5ML PO SUSP
30.0000 mL | ORAL | Status: DC | PRN
  Administered 2023-12-29: 30 mL via ORAL
  Filled 2023-12-29: qty 30

## 2023-12-29 MED ORDER — LORAZEPAM 1 MG PO TABS
1.0000 mg | ORAL_TABLET | ORAL | Status: DC | PRN
  Administered 2023-12-29 – 2023-12-31 (×5): 1 mg via ORAL
  Filled 2023-12-29 (×6): qty 1

## 2023-12-29 NOTE — Plan of Care (Signed)

## 2023-12-29 NOTE — Progress Notes (Signed)
 Progress Note   Patient: Christy Barnett FMW:[id number] DOB: Apr 13, 1970 DOA: 12/28/2023     0 DOS: the patient was seen and examined on 12/29/2023   Brief hospital course: Christy Barnett is a 53 y.o. female with medical history significant for chronic opioid abuse, who is admitted to [facility] on 12/28/2023 with acute opioid withdrawal, after presenting from home to [facility] ED complaining of nausea vomiting.  Patient is admitted to hospitalist service for further management evaluation of acute opioid withdrawal.  Assessment and Plan: Acute opioid withdrawal Presented with persistent nausea, vomiting, generalized abdominal discomfort. History of chronic opiate abuse, last used heroin and fentanyl  1225. Continue clonidine , Ativan  as needed. Continue IV antiemetics as needed nausea, Imodium  as needed for diarrhea. I started her on gentle IV fluids, Motrin  for moderate pain, tramadol  for severe pain.  Continue Tylenol  as needed. Continue clear liquid diet once more awake. Patient will be closely monitored in stepdown unit, admission changed to full.  SIRS- Low-grade temp, leukocytosis, tachycardia tachypnea in the setting of opiate withdrawal. WBC 12.4, lactic acid normalized.  Continue to follow blood cultures. Patient got IV cefepime  in the ED, no indication for antibiotics at this time.  Elevated blood pressure- In the setting of acute opioid withdrawal. Patient will be continued on clonidine  taper, IV labetalol  as needed for high blood pressures.  Hypokalemia: Continue to replace as needed and recheck.  Hypomagnesemia: Repleted.     Out of bed to chair. Incentive spirometry. Nursing supportive care. Fall, aspiration precautions. Diet:  Diet Orders (From admission, onward)     Start     Ordered   12/28/23 2302  Diet clear liquid Room service appropriate? Yes; Fluid consistency: Thin  Diet effective now       Question Answer Comment  Room service appropriate? Yes    Fluid consistency: Thin      12/28/23 2301           DVT prophylaxis: SCDs Start: 12/28/23 2302  Level of care: Stepdown   Code Status: Full Code  Subjective: Patient is seen and examined today morning.  She is more sleepy and lethargic, admits having nausea, diffuse abdominal discomfort.  Blood pressure elevated noted.  Tmax 100.9, tachycardia persist.  Physical Exam: Vitals:   12/29/23 1000 12/29/23 1100 12/29/23 1154 12/29/23 1200  BP: (!) 198/95 (!) 198/105  (!) 196/128  Pulse: (!) 112 98  96  Resp: 15 19  20   Temp:   99 F (37.2 C)   TempSrc:   Axillary   SpO2: 95% 92%  93%  Weight:      Height:        General - Middle aged ill Caucasian female, sleeping, upon awake distress due to pain HEENT - PERRLA, EOMI, atraumatic head, non tender sinuses. Lung - Clear, no rales, rhonchi, wheezes. Heart - S1, S2 heard, no murmurs, rubs, trace pedal edema. Abdomen - Soft, diffuse tenderness, no guarding or rigidity, bowel sounds good Neuro - lethargic, arousable, non focal exam. Skin - Warm and dry.  Data Reviewed:      Latest Ref Rng & Units 12/29/2023    3:26 AM 12/28/2023    3:54 PM 11/15/2023    4:57 AM  CBC  WBC 4.0 - 10.5 K/uL 12.4  12.0  6.1   Hemoglobin 12.0 - 15.0 g/dL 85.5  85.0  87.9   Hematocrit 36.0 - 46.0 % 43.9  45.0  37.6   Platelets 150 - 400 K/uL 327  383  317  Latest Ref Rng & Units 12/29/2023    3:26 AM 12/28/2023    3:54 PM 11/14/2023    7:40 AM  BMP  Glucose 70 - 99 mg/dL 98  85  898   BUN 6 - 20 mg/dL 5  7  7    Creatinine 0.44 - 1.00 mg/dL 9.46  9.37  9.33   Sodium 135 - 145 mmol/L 140  144  140   Potassium 3.5 - 5.1 mmol/L 3.6  3.1  3.7   Chloride 98 - 111 mmol/L 101  98  99   CO2 22 - 32 mmol/L 26  28  28    Calcium 8.9 - 10.3 mg/dL 8.9  89.7  89.8    CT CHEST ABDOMEN PELVIS W CONTRAST Result Date: 12/28/2023 EXAM: CT CHEST, ABDOMEN AND PELVIS WITH CONTRAST 12/28/2023 09:26:04 PM TECHNIQUE: CT of the chest, abdomen and pelvis  was performed with the administration of 100 mL of iohexol  (OMNIPAQUE ) 300 MG/ML solution. Multiplanar reformatted images are provided for review. Automated exposure control, iterative reconstruction, and/or weight based adjustment of the mA/kV was utilized to reduce the radiation dose to as low as reasonably achievable. COMPARISON: CT abdomen and pelvis 11/14/2023. CLINICAL HISTORY: Sepsis. FINDINGS: CHEST: MEDIASTINUM AND LYMPH NODES: Heart and pericardium are unremarkable. The central airways are clear. Small hiatal hernia. Distal esophageal wall thickening suggests esophagitis, possibly related to reflux. No mediastinal, hilar or axillary lymphadenopathy. LUNGS AND PLEURA: No focal consolidation or pulmonary edema. No pleural effusion or pneumothorax. ABDOMEN AND PELVIS: LIVER: The liver is unremarkable. GALLBLADDER AND BILE DUCTS: Prior cholecystectomy. No biliary ductal dilatation. SPLEEN: No acute abnormality. PANCREAS: No acute abnormality. ADRENAL GLANDS: No acute abnormality. KIDNEYS, URETERS AND BLADDER: No stones in the kidneys or ureters. No hydronephrosis. Mild bilateral perinephric stranding. No periureteral stranding. Urinary bladder is unremarkable. GI AND BOWEL: Stomach demonstrates no acute abnormality. There is no bowel obstruction. REPRODUCTIVE ORGANS: No acute abnormality. PERITONEUM AND RETROPERITONEUM: No ascites. No free air. VASCULATURE: Aorta is normal in caliber. ABDOMINAL AND PELVIS LYMPH NODES: No lymphadenopathy. REPRODUCTIVE ORGANS: No acute abnormality. BONES AND SOFT TISSUES: No acute osseous abnormality. No focal soft tissue abnormality. IMPRESSION: 1. Distal esophageal wall thickening suggests esophagitis, possibly related to reflux. 2. Small hiatal hernia. 3. No acute findings in the abdomen or pelvis. Electronically signed by: Franky Crease MD 12/28/2023 09:32 PM EST RP Workstation: [id number]   DG Chest Portable 1 View Result Date: 12/28/2023 CLINICAL DATA:  Shortness of  breath. EXAM: PORTABLE CHEST 1 VIEW COMPARISON:  Chest radiograph dated 03/13/2022. FINDINGS: The heart size and mediastinal contours are within normal limits. Both lungs are clear. The visualized skeletal structures are unremarkable. IMPRESSION: No active disease. Electronically Signed   By: Vanetta Chou M.D.   On: 12/28/2023 18:08    Family Communication: Discussed with patient, understand and agree. All questions answered.  Disposition: Status is: Inpatient Remains inpatient appropriate because: Acute opiate withdrawal, electrolyte abnormalities, IV Ativan   Planned Discharge Destination: Home with Home Health     Time spent: 51 minutes  Author: Concepcion Riser, MD 12/29/2023 1:07 PM Secure chat 7am to 7pm For on call review [email or web address].

## 2023-12-30 MED ORDER — IPRATROPIUM-ALBUTEROL 0.5-2.5 (3) MG/3ML IN SOLN
3.0000 mL | Freq: Two times a day (BID) | RESPIRATORY_TRACT | Status: DC
  Administered 2023-12-30 – 2023-12-31 (×3): 3 mL via RESPIRATORY_TRACT
  Filled 2023-12-30 (×3): qty 3

## 2023-12-30 MED ORDER — HYDRALAZINE HCL 20 MG/ML IJ SOLN: 10.0000 mg | INTRAMUSCULAR | Status: DC | PRN

## 2023-12-30 MED ORDER — GLUCAGON HCL RDNA (DIAGNOSTIC) 1 MG IJ SOLR: 1.0000 mg | INTRAMUSCULAR | Status: DC | PRN

## 2023-12-30 MED ADMIN — Labetalol HCl Tab 200 MG: 200 mg | ORAL | NDC 60687045011

## 2023-12-30 MED ADMIN — Ondansetron HCl Inj 4 MG/2ML (2 MG/ML): 4 mg | INTRAVENOUS | NDC 00409475518

## 2023-12-30 MED ADMIN — Trazodone HCl Tab 50 MG: 50 mg | ORAL | NDC 68382080510

## 2023-12-30 MED FILL — Labetalol HCl Tab 200 MG: 200.0000 mg | ORAL | Qty: 1 | Status: AC

## 2023-12-30 MED FILL — Trazodone HCl Tab 50 MG: 50.0000 mg | ORAL | Qty: 1 | Status: AC

## 2023-12-30 MED FILL — Ondansetron HCl Inj 4 MG/2ML (2 MG/ML): 4.0000 mg | INTRAMUSCULAR | Qty: 2 | Status: AC

## 2023-12-30 NOTE — Plan of Care (Signed)

## 2023-12-30 NOTE — Progress Notes (Signed)
 At 2315, pt noted to have heart rate increase to the 130s, and then climb to 140s and then 150s (from rate of 110-120s).  Pt moaning and rocking head and torso side to side in the bed.  COWs score 19, pt c/o 9/10 pain to head.  Tramadol  given per order, NP notified and EKG obtained and reviewed with NP.  Order received for x1 dose IV Ativan  1mg  - given with + effect; an order was also received for PO atarax  25mg  x1 dose - ok to delay per NP and given at 0330 when pt started to become restless at 0330, with + effect. QTC noted 440-460 this shift.

## 2023-12-30 NOTE — Progress Notes (Signed)
 PROGRESS NOTE    Christy Barnett  FMW:[id number] DOB: [birth year] DOA: 12/28/2023 PCP: [facility]    Brief Narrative:   53 y.o. female with medical history significant for chronic opioid abuse, who is admitted to [facility] on 12/28/2023 with acute opioid withdrawal, after presenting from home to [facility] ED complaining of nausea vomiting.  Patient is admitted to hospitalist service for further management evaluation of acute opioid withdrawal.   Assessment & Plan:   Acute opioid withdrawal Presented with persistent nausea, vomiting, generalized abdominal discomfort. History of chronic opiate abuse, last used heroin and fentanyl  1225. Continue clonidine , Ativan  as needed. Continue IV antiemetics as needed nausea, Imodium  as needed for diarrhea. I started her on gentle IV fluids, Motrin  for moderate pain, tramadol  for severe pain.  Continue Tylenol  as needed. Continue clear liquid diet once more awake. Patient will be closely monitored in stepdown unit, admission changed to full.   SIRS Low-grade temp, leukocytosis, tachycardia tachypnea in the setting of opiate withdrawal.   Elevated blood pressure/tachycardia Continue clonidine , add labetalol  twice daily IV as needed  Esophagitis - PPI.  Seen on CT scan   Hypokalemia/HypoMg -As needed repletion  DVT prophylaxis: SCDs Start: 12/28/23 2302   Code Status: Full Code Family Communication:   Status is: Inpatient Remains inpatient appropriate because: Continue hospital stay for evaluation and management of opioid withdrawal   PT Follow up Recs:   Subjective:  Seen at bedside appears to be little calm this morning after receiving Ativan  and clonidine .  Examination:  General exam: Appears calm and comfortable  Respiratory system: Mild bilateral rhonchi Cardiovascular system: S1 & S2 heard, RRR. No JVD, murmurs, rubs, gallops or clicks. No pedal edema. Gastrointestinal system: Abdomen is nondistended,  soft and nontender. No organomegaly or masses felt. Normal bowel sounds heard. Central nervous system: Alert and oriented. No focal neurological deficits. Extremities: Symmetric 5 x 5 power. Skin: No rashes, lesions or ulcers Psychiatry: Judgement and insight appear normal. Mood & affect appropriate.                Diet Orders (From admission, onward)     Start     Ordered   12/28/23 2302  Diet clear liquid Room service appropriate? Yes; Fluid consistency: Thin  Diet effective now       Question Answer Comment  Room service appropriate? Yes   Fluid consistency: Thin      12/28/23 2301            Objective: Vitals:   12/30/23 0811 12/30/23 0900 12/30/23 1000 12/30/23 1104  BP:  (!) 131/109 (!) 148/83 116/79  Pulse:  (!) 114 99 95  Resp: 15 13 12 11   Temp: 98.9 F (37.2 C)   98 F (36.7 C)  TempSrc:    Oral  SpO2:  98% 92% 93%  Weight:      Height:        Intake/Output Summary (Last 24 hours) at 12/30/2023 1133 Last data filed at 12/30/2023 0250 Gross per 24 hour  Intake 1529.34 ml  Output 1600 ml  Net -70.66 ml   Filed Weights   12/29/23 0030 12/29/23 0400 12/30/23 0439  Weight: 77.2 kg 77.2 kg 75.6 kg    Scheduled Meds:  Chlorhexidine  Gluconate Cloth  6 each Topical Daily   cloNIDine   0.1 mg Oral QID   Followed by   NOREEN ON 12/31/2023] cloNIDine   0.1 mg Oral BH-qamhs   Followed by   NOREEN ON 01/02/2024] cloNIDine   0.1  mg Oral QAC breakfast   ipratropium-albuterol   3 mL Nebulization BID   labetalol   200 mg Oral BID   pantoprazole  (PROTONIX ) IV  40 mg Intravenous Q12H   Continuous Infusions:  lactated ringers  75 mL/hr at 12/30/23 0250    Nutritional status     Body mass index is 31.49 kg/m.  Data Reviewed:   CBC: Recent Labs  Lab 12/28/23 1554 12/29/23 0326  WBC 12.0* 12.4*  NEUTROABS 10.6* 9.7*  HGB 14.9 14.4  HCT 45.0 43.9  MCV 85.6 85.6  PLT 383 327   Basic Metabolic Panel: Recent Labs  Lab 12/28/23 1554  12/28/23 1730 12/29/23 0326  NA 144  --  140  K 3.1*  --  3.6  CL 98  --  101  CO2 28  --  26  GLUCOSE 85  --  98  BUN 7  --  5*  CREATININE 0.62  --  0.53  CALCIUM 10.2  --  8.9  MG  --  1.5* 2.2   GFR: Estimated Creatinine Clearance: 74.7 mL/min (by C-G formula based on SCr of 0.53 mg/dL). Liver Function Tests: Recent Labs  Lab 12/28/23 1554 12/29/23 0326  AST 32 31  ALT 18 16  ALKPHOS 131* 116  BILITOT 0.4 0.5  PROT 8.3* 7.4  ALBUMIN 4.8 4.2   No results for input(s): LIPASE, AMYLASE in the last 168 hours. No results for input(s): AMMONIA in the last 168 hours. Coagulation Profile: Recent Labs  Lab 12/29/23 0811  INR 1.1   Cardiac Enzymes: No results for input(s): CKTOTAL, CKMB, CKMBINDEX, TROPONINI in the last 168 hours. BNP (last 3 results) No results for input(s): PROBNP in the last 8760 hours. HbA1C: No results for input(s): HGBA1C in the last 72 hours. CBG: No results for input(s): GLUCAP in the last 168 hours. Lipid Profile: No results for input(s): CHOL, HDL, LDLCALC, TRIG, CHOLHDL, LDLDIRECT in the last 72 hours. Thyroid Function Tests: No results for input(s): TSH, T4TOTAL, FREET4, T3FREE, THYROIDAB in the last 72 hours. Anemia Panel: No results for input(s): VITAMINB12, FOLATE, FERRITIN, TIBC, IRON, RETICCTPCT in the last 72 hours. Sepsis Labs: Recent Labs  Lab 12/28/23 1740 12/28/23 1935 12/29/23 0811  LATICACIDVEN 3.1* 2.8* 1.0    Recent Results (from the past 240 hours)  Culture, blood (routine x 2)     Status: None (Preliminary result)   Collection Time: 12/28/23  5:30 PM   Specimen: BLOOD LEFT ARM  Result Value Ref Range Status   Specimen Description   Final    BLOOD LEFT ARM Performed at [facility], [address] [place], [place] [address]    Special Requests   Final    BOTTLES DRAWN AEROBIC AND ANAEROBIC Blood Culture adequate volume Performed at [facility], [address], [place], [place] [address]    Culture   Final    NO GROWTH 2 DAYS Performed at [facility], [address] [place], [place] [address]    Report Status PENDING  Incomplete  Resp panel by RT-PCR (RSV, Flu A&B, Covid) Anterior Nasal Swab     Status: None   Collection Time: 12/28/23  5:58 PM   Specimen: Anterior Nasal Swab  Result Value Ref Range Status   SARS Coronavirus 2 by RT PCR NEGATIVE NEGATIVE Final    Comment: (NOTE) SARS-CoV-2 target nucleic acids are NOT DETECTED.  The SARS-CoV-2 RNA is generally detectable in upper respiratory specimens during the acute phase of infection. The lowest  concentration of SARS-CoV-2 viral copies this assay can detect is 138 copies/mL. A negative result does not preclude SARS-Cov-2 infection and should not be used as the sole basis for treatment or other patient management decisions. A negative result may occur with  improper specimen collection/handling, submission of specimen other than nasopharyngeal swab, presence of viral mutation(s) within the areas targeted by this assay, and inadequate number of viral copies(<138 copies/mL). A negative result must be combined with clinical observations, patient history, and epidemiological information. The expected result is Negative.  Fact Sheet for Patients:  bloggercourse.com  Fact Sheet for Healthcare Providers:  seriousbroker.it  This test is no t yet approved or cleared by the [place]  FDA and  has been authorized for detection and/or diagnosis of SARS-CoV-2 by FDA under an Emergency Use Authorization (EUA). This EUA will remain  in effect (meaning this test can be used) for the duration of the COVID-19 declaration under Section 564(b)(1) of the Act, 21 U.S.C.section 360bbb-3(b)(1), unless the authorization is terminated  or revoked sooner.       Influenza A by PCR NEGATIVE NEGATIVE Final    Influenza B by PCR NEGATIVE NEGATIVE Final    Comment: (NOTE) The Xpert Xpress SARS-CoV-2/FLU/RSV plus assay is intended as an aid in the diagnosis of influenza from Nasopharyngeal swab specimens and should not be used as a sole basis for treatment. Nasal washings and aspirates are unacceptable for Xpert Xpress SARS-CoV-2/FLU/RSV testing.  Fact Sheet for Patients: bloggercourse.com  Fact Sheet for Healthcare Providers: seriousbroker.it  This test is not yet approved or cleared by the [place]  FDA and has been authorized for detection and/or diagnosis of SARS-CoV-2 by FDA under an Emergency Use Authorization (EUA). This EUA will remain in effect (meaning this test can be used) for the duration of the COVID-19 declaration under Section 564(b)(1) of the Act, 21 U.S.C. section 360bbb-3(b)(1), unless the authorization is terminated or revoked.     Resp Syncytial Virus by PCR NEGATIVE NEGATIVE Final    Comment: (NOTE) Fact Sheet for Patients: bloggercourse.com  Fact Sheet for Healthcare Providers: seriousbroker.it  This test is not yet approved or cleared by the [place]  FDA and has been authorized for detection and/or diagnosis of SARS-CoV-2 by FDA under an Emergency Use Authorization (EUA). This EUA will remain in effect (meaning this test can be used) for the duration of the COVID-19 declaration under Section 564(b)(1) of the Act, 21 U.S.C. section 360bbb-3(b)(1), unless the authorization is terminated or revoked.  Performed at [facility], [address], [place], [place] [address]   Culture, blood (routine x 2)     Status: None (Preliminary result)   Collection Time: 12/28/23  7:28 PM   Specimen: BLOOD  Result Value Ref Range Status   Specimen Description   Final    BLOOD RIGHT ANTECUBITAL Performed at [facility], [address], [place], [place] [address]    Special Requests   Final    BOTTLES DRAWN AEROBIC AND ANAEROBIC Blood Culture results may not be optimal due to an inadequate volume of blood received in culture bottles Performed at [facility], [address], [place], [place] [address]    Culture   Final    NO GROWTH 2 DAYS Performed at [facility], [address], [place], [place] [address]    Report Status PENDING  Incomplete  MRSA Next Gen by PCR, Nasal     Status: None   Collection Time: 12/29/23  1:55 AM   Specimen: Nasal Mucosa; Nasal Swab  Result Value Ref Range Status   MRSA by PCR Next Gen NOT DETECTED NOT DETECTED Final    Comment: (NOTE) The GeneXpert MRSA Assay (FDA approved for NASAL specimens only), is one component of a comprehensive MRSA colonization surveillance program. It is not intended to diagnose MRSA infection nor to guide or monitor treatment for MRSA infections. Test performance is not FDA approved in patients less than 33 years old. Performed at [facility], [address], [place], [place] [address]          Radiology Studies: CT CHEST ABDOMEN PELVIS W CONTRAST Result Date: 12/28/2023 EXAM: CT CHEST, ABDOMEN AND PELVIS WITH CONTRAST 12/28/2023 09:26:04 PM TECHNIQUE: CT of the chest, abdomen and pelvis was performed with the administration of 100 mL of iohexol  (OMNIPAQUE ) 300 MG/ML solution. Multiplanar reformatted images are provided for review. Automated exposure control, iterative reconstruction, and/or weight based adjustment of the mA/kV was utilized to reduce the radiation dose to as low as reasonably achievable. COMPARISON: CT abdomen and pelvis 11/14/2023. CLINICAL HISTORY: Sepsis. FINDINGS: CHEST: MEDIASTINUM AND LYMPH NODES: Heart and pericardium are unremarkable. The central airways are clear. Small hiatal hernia. Distal esophageal wall thickening suggests esophagitis, possibly related to reflux. No mediastinal, hilar  or axillary lymphadenopathy. LUNGS AND PLEURA: No focal consolidation or pulmonary edema. No pleural effusion or pneumothorax. ABDOMEN AND PELVIS: LIVER: The liver is unremarkable. GALLBLADDER AND BILE DUCTS: Prior cholecystectomy. No biliary ductal dilatation. SPLEEN: No acute abnormality. PANCREAS: No acute abnormality. ADRENAL GLANDS: No acute abnormality. KIDNEYS, URETERS AND BLADDER: No stones in the kidneys or ureters. No hydronephrosis. Mild bilateral perinephric stranding. No periureteral stranding. Urinary bladder is unremarkable. GI AND BOWEL: Stomach demonstrates no acute abnormality. There is no bowel obstruction. REPRODUCTIVE ORGANS: No acute abnormality. PERITONEUM AND RETROPERITONEUM: No ascites. No free air. VASCULATURE: Aorta is normal in caliber. ABDOMINAL AND PELVIS LYMPH NODES: No lymphadenopathy. REPRODUCTIVE ORGANS: No acute abnormality. BONES AND SOFT TISSUES: No acute osseous abnormality. No focal soft tissue abnormality. IMPRESSION: 1. Distal esophageal wall thickening suggests esophagitis, possibly related to reflux. 2. Small hiatal hernia. 3. No acute findings in the abdomen or pelvis. Electronically signed by: Franky Crease MD 12/28/2023 09:32 PM EST RP Workstation: [id number]   DG Chest Portable 1 View Result Date: 12/28/2023 CLINICAL DATA:  Shortness of breath. EXAM: PORTABLE CHEST 1 VIEW COMPARISON:  Chest radiograph dated 03/13/2022. FINDINGS: The heart size and mediastinal contours are within normal limits. Both lungs are clear. The visualized skeletal structures are unremarkable. IMPRESSION: No active disease. Electronically Signed   By: Vanetta Chou M.D.   On: 12/28/2023 18:08           LOS: 1 day   Time spent= 35 mins    Burgess JAYSON Dare, MD [facility]  If 7PM-7AM, please contact night-coverage  12/30/2023, 11:33 AM

## 2023-12-30 NOTE — Plan of Care (Signed)
  Problem: Education: Goal: Knowledge of General Education information will improve Description: Including pain rating scale, medication(s)/side effects and non-pharmacologic comfort measures Outcome: Progressing   Problem: Clinical Measurements: Goal: Ability to maintain clinical measurements within normal limits will improve Outcome: Progressing Goal: Will remain free from infection Outcome: Progressing Goal: Diagnostic test results will improve Outcome: Progressing Goal: Respiratory complications will improve Outcome: Progressing Goal: Cardiovascular complication will be avoided Outcome: Progressing   Problem: Activity: Goal: Risk for activity intolerance will decrease Outcome: Progressing   Problem: Nutrition: Goal: Adequate nutrition will be maintained Outcome: Progressing   Problem: Pain Managment: Goal: General experience of comfort will improve and/or be controlled Outcome: Progressing   Problem: Safety: Goal: Ability to remain free from injury will improve Outcome: Progressing   Problem: Skin Integrity: Goal: Risk for impaired skin integrity will decrease Outcome: Progressing

## 2023-12-30 NOTE — Hospital Course (Addendum)
 Brief Narrative:   53 y.o. female with medical history significant for chronic opioid abuse, who is admitted to [facility] on 12/28/2023 with acute opioid withdrawal, after presenting from home to [facility] ED complaining of nausea vomiting.  Patient is admitted to hospitalist service for further management evaluation of acute opioid withdrawal.   Assessment & Plan:   Acute opioid withdrawal Presented with persistent nausea, vomiting, generalized abdominal discomfort. History of chronic opiate abuse, last used heroin and fentanyl  1225. Continue clonidine , Ativan  as needed. Continue IV antiemetics as needed nausea, Imodium  as needed for diarrhea. I started her on gentle IV fluids, Motrin  for moderate pain, tramadol  for severe pain.  Continue Tylenol  as needed. Continue clear liquid diet once more awake. Patient will be closely monitored in stepdown unit, admission changed to full.   SIRS Low-grade temp, leukocytosis, tachycardia tachypnea in the setting of opiate withdrawal.   Elevated blood pressure/tachycardia Continue clonidine , add labetalol  twice daily IV as needed  Esophagitis - PPI.  Seen on CT scan   Hypokalemia/HypoMg -As needed repletion  DVT prophylaxis: SCDs Start: 12/28/23 2302   Code Status: Full Code Family Communication:   Status is: Inpatient Remains inpatient appropriate because: Continue hospital stay for evaluation and management of opioid withdrawal   PT Follow up Recs:   Subjective:  Seen at bedside appears to be little calm this morning after receiving Ativan  and clonidine .  Examination:  General exam: Appears calm and comfortable  Respiratory system: Mild bilateral rhonchi Cardiovascular system: S1 & S2 heard, RRR. No JVD, murmurs, rubs, gallops or clicks. No pedal edema. Gastrointestinal system: Abdomen is nondistended, soft and nontender. No organomegaly or masses felt. Normal bowel sounds heard. Central nervous system: Alert and oriented. No  focal neurological deficits. Extremities: Symmetric 5 x 5 power. Skin: No rashes, lesions or ulcers Psychiatry: Judgement and insight appear normal. Mood & affect appropriate.

## 2023-12-31 ENCOUNTER — Other Ambulatory Visit (HOSPITAL_COMMUNITY): Payer: Self-pay

## 2023-12-31 DIAGNOSIS — F1193 Opioid use, unspecified with withdrawal: Secondary | ICD-10-CM | POA: Diagnosis not present

## 2023-12-31 LAB — CBC
HCT: 38.8 % (ref 36.0–46.0)
Hemoglobin: 12.5 g/dL (ref 12.0–15.0)
MCH: 28.3 pg (ref 26.0–34.0)
MCHC: 32.2 g/dL (ref 30.0–36.0)
MCV: 88 fL (ref 80.0–100.0)
Platelets: 254 K/uL (ref 150–400)
RBC: 4.41 MIL/uL (ref 3.87–5.11)
RDW: 13.7 % (ref 11.5–15.5)
WBC: 5.9 K/uL (ref 4.0–10.5)
nRBC: 0 % (ref 0.0–0.2)

## 2023-12-31 LAB — BASIC METABOLIC PANEL WITH GFR
Anion gap: 9 (ref 5–15)
BUN: 9 mg/dL (ref 6–20)
CO2: 29 mmol/L (ref 22–32)
Calcium: 8.8 mg/dL — ABNORMAL LOW (ref 8.9–10.3)
Chloride: 104 mmol/L (ref 98–111)
Creatinine, Ser: 0.66 mg/dL (ref 0.44–1.00)
GFR, Estimated: >60 mL/min (ref >60)
Glucose, Bld: 99 mg/dL (ref 70–99)
Potassium: 3.4 mmol/L — ABNORMAL LOW (ref 3.5–5.1)
Sodium: 141 mmol/L (ref 135–145)

## 2023-12-31 LAB — MAGNESIUM: Magnesium: 2.1 mg/dL (ref 1.7–2.4)

## 2023-12-31 MED ORDER — POTASSIUM CHLORIDE CRYS ER 20 MEQ PO TBCR
40.0000 meq | EXTENDED_RELEASE_TABLET | Freq: Once | ORAL | Status: AC
  Administered 2023-12-31: 40 meq via ORAL
  Filled 2023-12-31: qty 2

## 2023-12-31 MED ORDER — PANTOPRAZOLE SODIUM 40 MG PO TBEC
40.0000 mg | DELAYED_RELEASE_TABLET | Freq: Two times a day (BID) | ORAL | Status: DC
  Administered 2023-12-31: 40 mg via ORAL
  Filled 2023-12-31: qty 1

## 2023-12-31 MED ORDER — LABETALOL HCL 200 MG PO TABS
200.0000 mg | ORAL_TABLET | Freq: Two times a day (BID) | ORAL | 0 refills | Status: AC
  Filled 2023-12-31: qty 60, 30d supply, fill #0

## 2023-12-31 MED ORDER — PANTOPRAZOLE SODIUM 40 MG PO TBEC
40.0000 mg | DELAYED_RELEASE_TABLET | Freq: Every day | ORAL | 0 refills | Status: AC
  Filled 2023-12-31: qty 90, 90d supply, fill #0

## 2023-12-31 MED ORDER — NICOTINE 14 MG/24HR TD PT24
14.0000 mg | MEDICATED_PATCH | Freq: Every day | TRANSDERMAL | 0 refills | Status: AC | PRN
  Filled 2023-12-31: qty 28, 28d supply, fill #0

## 2023-12-31 MED ORDER — CLONIDINE HCL 0.1 MG PO TABS
ORAL_TABLET | ORAL | 0 refills | Status: DC
  Filled 2023-12-31: qty 4, 3d supply, fill #0

## 2023-12-31 NOTE — Plan of Care (Signed)
  Problem: Health Behavior/Discharge Planning: Goal: Ability to manage health-related needs will improve Outcome: Progressing   Problem: Clinical Measurements: Goal: Ability to maintain clinical measurements within normal limits will improve Outcome: Progressing Goal: Will remain free from infection Outcome: Progressing   Problem: Education: Goal: Knowledge of General Education information will improve Description: Including pain rating scale, medication(s)/side effects and non-pharmacologic comfort measures Outcome: Not Progressing

## 2023-12-31 NOTE — Discharge Summary (Signed)
 Physician Discharge Summary  Christy Barnett FMW:[id number] DOB: 08-03-1970 DOA: 12/28/2023  PCP: [facility]  Admit date: 12/28/2023 Discharge date: 12/31/2023  Admitted From: Home Disposition: Home  Recommendations for Outpatient Follow-up:  Follow up with PCP in 1-2 weeks Please obtain BMP/CBC in one week your next doctors visit.  Clonidine  taper over next 2 days Added labetalol  twice daily for better blood pressure control Counseled to quit using illicit drugs   Discharge Condition: Stable CODE STATUS: Full code Diet recommendation: Low-salt  Brief/Interim Summary: Brief Narrative:   53 y.o. female with medical history significant for chronic opioid abuse, who is admitted to [facility] on 12/28/2023 with acute opioid withdrawal, after presenting from home to [facility] ED complaining of nausea vomiting.  Patient is admitted to hospitalist service for further management evaluation of acute opioid withdrawal.  Slowly her vital signs improved without any severe signs of withdrawal.  Labetalol  was started due to elevated blood pressures.  Blood pressures are improved, requesting to be discharged.  Assessment & Plan:   Acute opioid withdrawal, improving Likely from opioid withdrawal.  Now doing much better on clonidine  taper and supportive care.  Will complete her home clonidine  taper   SIRS Low-grade temp, leukocytosis, tachycardia tachypnea in the setting of opiate withdrawal.   Elevated blood pressure/tachycardia Continue clonidine , add labetalol  twice daily Prescription has been given  Esophagitis - PPI.  Seen on CT scan   Hypokalemia/HypoMg -As needed repletion  DVT prophylaxis: SCDs Start: 12/28/23 2302   Code Status: Full Code Family Communication:   Status is: Inpatient Remains inpatient appropriate because: Continue hospital stay for evaluation and management of opioid withdrawal   PT Follow up Recs:   Subjective: Blood pressure appears to  have improved [place] about being discharged today  Examination:  General exam: Appears calm and comfortable  Respiratory system: Mild bilateral rhonchi, resolved Cardiovascular system: S1 & S2 heard, RRR. No JVD, murmurs, rubs, gallops or clicks. No pedal edema. Gastrointestinal system: Abdomen is nondistended, soft and nontender. No organomegaly or masses felt. Normal bowel sounds heard. Central nervous system: Alert and oriented. No focal neurological deficits. Extremities: Symmetric 5 x 5 power. Skin: No rashes, lesions or ulcers Psychiatry: Judgement and insight appear normal. Mood & affect appropriate.    Discharge Diagnoses:  Principal Problem:   Opiate withdrawal (HCC) Active Problems:   Nausea & vomiting   SIRS (systemic inflammatory response syndrome) (HCC)   Esophagitis   Elevated blood pressure reading   Hypokalemia   Hypomagnesemia   Lactic acidosis   Tobacco abuse   Opioid withdrawal (HCC)      Discharge Exam: Vitals:   12/31/23 0822 12/31/23 0926  BP: (!) 156/101   Pulse: 99   Resp: 18   Temp: 98.7 F (37.1 C)   SpO2: 95% 97%   Vitals:   12/31/23 0500 12/31/23 0600 12/31/23 0822 12/31/23 0926  BP: (!) 134/98 134/68 (!) 156/101   Pulse: 98 81 99   Resp: 11 (!) 24 18   Temp:   98.7 F (37.1 C)   TempSrc:      SpO2: 98% 99% 95% 97%  Weight:      Height:          Discharge Instructions   Allergies as of 12/31/2023   No Known Allergies      Medication List     STOP taking these medications    Acetaminophen  Extra Strength 500 MG Tabs Commonly known as: TYLENOL    amLODipine  5 MG tablet  Commonly known as: NORVASC    ibuprofen  600 MG tablet Commonly known as: ADVIL    oxyCODONE  5 MG immediate release tablet Commonly known as: Roxicodone        TAKE these medications    cloNIDine  0.1 MG tablet Commonly known as: CATAPRES  Take 1 tablet by mouth 2 times daily in the am and at bedtime. for 1 day, THEN 1 tablet daily before  breakfast for 2 days. Start taking on: December 31, 2023   labetalol  200 MG tablet Commonly known as: NORMODYNE  Take 1 tablet (200 mg total) by mouth 2 (two) times daily.   methocarbamol  750 MG tablet Commonly known as: ROBAXIN  Take 1 tablet (750 mg total) by mouth 4 (four) times daily.   nicotine  14 mg/24hr patch Commonly known as: NICODERM CQ  - dosed in mg/24 hours Place 1 patch (14 mg total) onto the skin daily as needed (smoking cessation).   pantoprazole  40 MG tablet Commonly known as: PROTONIX  Take 1 tablet (40 mg total) by mouth daily before breakfast.   polyethylene glycol powder 17 GM/SCOOP powder Commonly known as: MiraLax  Take 17 g by mouth daily. Dissolve 1 capful (17g) in 4-8 ounces of liquid and take by mouth daily.        Follow-up Information     [facility] Follow up in 1 week(s).   Contact information: [address] [place] [address] [phone]                Allergies[1]  You were cared for by a hospitalist during your hospital stay. If you have any questions about your discharge medications or the care you received while you were in the hospital after you are discharged, you can call the unit and asked to speak with the hospitalist on call if the hospitalist that took care of you is not available. Once you are discharged, your primary care physician will handle any further medical issues. Please note that no refills for any discharge medications will be authorized once you are discharged, as it is imperative that you return to your primary care physician (or establish a relationship with a primary care physician if you do not have one) for your aftercare needs so that they can reassess your need for medications and monitor your lab values.  You were cared for by a hospitalist during your hospital stay. If you have any questions about your discharge medications or the care you received while you were in the hospital after you are  discharged, you can call the unit and asked to speak with the hospitalist on call if the hospitalist that took care of you is not available. Once you are discharged, your primary care physician will handle any further medical issues. Please note that NO REFILLS for any discharge medications will be authorized once you are discharged, as it is imperative that you return to your primary care physician (or establish a relationship with a primary care physician if you do not have one) for your aftercare needs so that they can reassess your need for medications and monitor your lab values.  Please request your Prim.MD to go over all Hospital Tests and Procedure/Radiological results at the follow up, please get all Hospital records sent to your Prim MD by signing hospital release before you go home.  Get CBC, CMP, 2 view Chest X ray checked  by Primary MD during your next visit or SNF MD in 5-7 days ( we routinely change or add medications that can affect your baseline  labs and fluid status, therefore we recommend that you get the mentioned basic workup next visit with your PCP, your PCP may decide not to get them or add new tests based on their clinical decision)  On your next visit with your primary care physician please Get Medicines reviewed and adjusted.  If you experience worsening of your admission symptoms, develop shortness of breath, life threatening emergency, suicidal or homicidal thoughts you must seek medical attention immediately by calling 911 or calling your MD immediately  if symptoms less severe.  You Must read complete instructions/literature along with all the possible adverse reactions/side effects for all the Medicines you take and that have been prescribed to you. Take any new Medicines after you have completely understood and accpet all the possible adverse reactions/side effects.   Do not drive, operate heavy machinery, perform activities at heights, swimming or participation in water  activities or provide baby sitting services if your were admitted for syncope or siezures until you have seen by Primary MD or a Neurologist and advised to do so again.  Do not drive when taking Pain medications.   Procedures/Studies: CT CHEST ABDOMEN PELVIS W CONTRAST Result Date: 12/28/2023 EXAM: CT CHEST, ABDOMEN AND PELVIS WITH CONTRAST 12/28/2023 09:26:04 PM TECHNIQUE: CT of the chest, abdomen and pelvis was performed with the administration of 100 mL of iohexol  (OMNIPAQUE ) 300 MG/ML solution. Multiplanar reformatted images are provided for review. Automated exposure control, iterative reconstruction, and/or weight based adjustment of the mA/kV was utilized to reduce the radiation dose to as low as reasonably achievable. COMPARISON: CT abdomen and pelvis 11/14/2023. CLINICAL HISTORY: Sepsis. FINDINGS: CHEST: MEDIASTINUM AND LYMPH NODES: Heart and pericardium are unremarkable. The central airways are clear. Small hiatal hernia. Distal esophageal wall thickening suggests esophagitis, possibly related to reflux. No mediastinal, hilar or axillary lymphadenopathy. LUNGS AND PLEURA: No focal consolidation or pulmonary edema. No pleural effusion or pneumothorax. ABDOMEN AND PELVIS: LIVER: The liver is unremarkable. GALLBLADDER AND BILE DUCTS: Prior cholecystectomy. No biliary ductal dilatation. SPLEEN: No acute abnormality. PANCREAS: No acute abnormality. ADRENAL GLANDS: No acute abnormality. KIDNEYS, URETERS AND BLADDER: No stones in the kidneys or ureters. No hydronephrosis. Mild bilateral perinephric stranding. No periureteral stranding. Urinary bladder is unremarkable. GI AND BOWEL: Stomach demonstrates no acute abnormality. There is no bowel obstruction. REPRODUCTIVE ORGANS: No acute abnormality. PERITONEUM AND RETROPERITONEUM: No ascites. No free air. VASCULATURE: Aorta is normal in caliber. ABDOMINAL AND PELVIS LYMPH NODES: No lymphadenopathy. REPRODUCTIVE ORGANS: No acute abnormality. BONES AND SOFT  TISSUES: No acute osseous abnormality. No focal soft tissue abnormality. IMPRESSION: 1. Distal esophageal wall thickening suggests esophagitis, possibly related to reflux. 2. Small hiatal hernia. 3. No acute findings in the abdomen or pelvis. Electronically signed by: Franky Crease MD 12/28/2023 09:32 PM EST RP Workstation: [id number]   DG Chest Portable 1 View Result Date: 12/28/2023 CLINICAL DATA:  Shortness of breath. EXAM: PORTABLE CHEST 1 VIEW COMPARISON:  Chest radiograph dated 03/13/2022. FINDINGS: The heart size and mediastinal contours are within normal limits. Both lungs are clear. The visualized skeletal structures are unremarkable. IMPRESSION: No active disease. Electronically Signed   By: Vanetta Chou M.D.   On: 12/28/2023 18:08     The results of significant diagnostics from this hospitalization (including imaging, microbiology, ancillary and laboratory) are listed below for reference.     Microbiology: Recent Results (from the past 240 hours)  Culture, blood (routine x 2)     Status: None (Preliminary result)   Collection Time: 12/28/23  5:30 PM  Specimen: BLOOD LEFT ARM  Result Value Ref Range Status   Specimen Description   Final    BLOOD LEFT ARM Performed at [facility], [address], [place], [place] [address]    Special Requests   Final    BOTTLES DRAWN AEROBIC AND ANAEROBIC Blood Culture adequate volume Performed at [facility], [address] [place], [place] [address]    Culture   Final    NO GROWTH 3 DAYS Performed at [facility], [address] [place], [place] [address]    Report Status PENDING  Incomplete  Resp panel by RT-PCR (RSV, Flu A&B, Covid) Anterior Nasal Swab     Status: None   Collection Time: 12/28/23  5:58 PM   Specimen: Anterior Nasal Swab  Result Value Ref Range Status   SARS Coronavirus 2 by RT PCR NEGATIVE NEGATIVE Final    Comment: (NOTE) SARS-CoV-2 target nucleic acids are NOT  DETECTED.  The SARS-CoV-2 RNA is generally detectable in upper respiratory specimens during the acute phase of infection. The lowest concentration of SARS-CoV-2 viral copies this assay can detect is 138 copies/mL. A negative result does not preclude SARS-Cov-2 infection and should not be used as the sole basis for treatment or other patient management decisions. A negative result may occur with  improper specimen collection/handling, submission of specimen other than nasopharyngeal swab, presence of viral mutation(s) within the areas targeted by this assay, and inadequate number of viral copies(<138 copies/mL). A negative result must be combined with clinical observations, patient history, and epidemiological information. The expected result is Negative.  Fact Sheet for Patients:  bloggercourse.com  Fact Sheet for Healthcare Providers:  seriousbroker.it  This test is no t yet approved or cleared by the [place]  FDA and  has been authorized for detection and/or diagnosis of SARS-CoV-2 by FDA under an Emergency Use Authorization (EUA). This EUA will remain  in effect (meaning this test can be used) for the duration of the COVID-19 declaration under Section 564(b)(1) of the Act, 21 U.S.C.section 360bbb-3(b)(1), unless the authorization is terminated  or revoked sooner.       Influenza A by PCR NEGATIVE NEGATIVE Final   Influenza B by PCR NEGATIVE NEGATIVE Final    Comment: (NOTE) The Xpert Xpress SARS-CoV-2/FLU/RSV plus assay is intended as an aid in the diagnosis of influenza from Nasopharyngeal swab specimens and should not be used as a sole basis for treatment. Nasal washings and aspirates are unacceptable for Xpert Xpress SARS-CoV-2/FLU/RSV testing.  Fact Sheet for Patients: bloggercourse.com  Fact Sheet for Healthcare Providers: seriousbroker.it  This test is not yet  approved or cleared by the [place]  FDA and has been authorized for detection and/or diagnosis of SARS-CoV-2 by FDA under an Emergency Use Authorization (EUA). This EUA will remain in effect (meaning this test can be used) for the duration of the COVID-19 declaration under Section 564(b)(1) of the Act, 21 U.S.C. section 360bbb-3(b)(1), unless the authorization is terminated or revoked.     Resp Syncytial Virus by PCR NEGATIVE NEGATIVE Final    Comment: (NOTE) Fact Sheet for Patients: bloggercourse.com  Fact Sheet for Healthcare Providers: seriousbroker.it  This test is not yet approved or cleared by the [place]  FDA and has been authorized for detection and/or diagnosis of SARS-CoV-2 by FDA under an Emergency Use Authorization (EUA). This EUA will remain in effect (meaning this test can be used) for the duration of the COVID-19 declaration under Section 564(b)(1) of the Act, 21  U.S.C. section 360bbb-3(b)(1), unless the authorization is terminated or revoked.  Performed at [facility], [address], [place], [place] [address]   Culture, blood (routine x 2)     Status: None (Preliminary result)   Collection Time: 12/28/23  7:28 PM   Specimen: BLOOD  Result Value Ref Range Status   Specimen Description   Final    BLOOD RIGHT ANTECUBITAL Performed at [facility], [address], [place], [place] [address]    Special Requests   Final    BOTTLES DRAWN AEROBIC AND ANAEROBIC Blood Culture results may not be optimal due to an inadequate volume of blood received in culture bottles Performed at [facility], [address], [place], [place] [address]    Culture   Final    NO GROWTH 3 DAYS Performed at [facility], [address] [place], [place] [address]    Report Status PENDING  Incomplete  MRSA Next Gen by PCR, Nasal     Status: None   Collection Time: 12/29/23   1:55 AM   Specimen: Nasal Mucosa; Nasal Swab  Result Value Ref Range Status   MRSA by PCR Next Gen NOT DETECTED NOT DETECTED Final    Comment: (NOTE) The GeneXpert MRSA Assay (FDA approved for NASAL specimens only), is one component of a comprehensive MRSA colonization surveillance program. It is not intended to diagnose MRSA infection nor to guide or monitor treatment for MRSA infections. Test performance is not FDA approved in patients less than 57 years old. Performed at [facility], [address], [place], [place] [address]      Labs: BNP (last 3 results) No results for input(s): BNP in the last 8760 hours. Basic Metabolic Panel: Recent Labs  Lab 12/28/23 1554 12/28/23 1730 12/29/23 0326 12/31/23 0327  NA 144  --  140 141  K 3.1*  --  3.6 3.4*  CL 98  --  101 104  CO2 28  --  26 29  GLUCOSE 85  --  98 99  BUN 7  --  5* 9  CREATININE 0.62  --  0.53 0.66  CALCIUM 10.2  --  8.9 8.8*  MG  --  1.5* 2.2 2.1   Liver Function Tests: Recent Labs  Lab 12/28/23 1554 12/29/23 0326  AST 32 31  ALT 18 16  ALKPHOS 131* 116  BILITOT 0.4 0.5  PROT 8.3* 7.4  ALBUMIN 4.8 4.2   No results for input(s): LIPASE, AMYLASE in the last 168 hours. No results for input(s): AMMONIA in the last 168 hours. CBC: Recent Labs  Lab 12/28/23 1554 12/29/23 0326 12/31/23 0327  WBC 12.0* 12.4* 5.9  NEUTROABS 10.6* 9.7*  --   HGB 14.9 14.4 12.5  HCT 45.0 43.9 38.8  MCV 85.6 85.6 88.0  PLT 383 327 254   Cardiac Enzymes: No results for input(s): CKTOTAL, CKMB, CKMBINDEX, TROPONINI in the last 168 hours. BNP: Invalid input(s): POCBNP CBG: No results for input(s): GLUCAP in the last 168 hours. D-Dimer No results for input(s): DDIMER in the last 72 hours. Hgb A1c No results for input(s): HGBA1C in the last 72 hours. Lipid Profile No results for input(s): CHOL, HDL, LDLCALC, TRIG, CHOLHDL, LDLDIRECT in the last 72 hours. Thyroid  function studies No results for input(s): TSH, T4TOTAL, T3FREE, THYROIDAB in the last 72 hours.  Invalid input(s): FREET3 Anemia work up No results for input(s): VITAMINB12, FOLATE, FERRITIN, TIBC, IRON, RETICCTPCT in the last 72  hours. Urinalysis    Component Value Date/Time   COLORURINE STRAW (A) 12/28/2023 1658   APPEARANCEUR CLEAR 12/28/2023 1658   LABSPEC 1.009 12/28/2023 1658   PHURINE 8.0 12/28/2023 1658   GLUCOSEU NEGATIVE 12/28/2023 1658   HGBUR NEGATIVE 12/28/2023 1658   BILIRUBINUR NEGATIVE 12/28/2023 1658   BILIRUBINUR neg 07/16/2017 1227   KETONESUR 20 (A) 12/28/2023 1658   PROTEINUR 30 (A) 12/28/2023 1658   UROBILINOGEN 1.0 07/16/2017 1227   UROBILINOGEN 1.0 09/17/2010 0254   NITRITE NEGATIVE 12/28/2023 1658   LEUKOCYTESUR NEGATIVE 12/28/2023 1658   Sepsis Labs Recent Labs  Lab 12/28/23 1554 12/29/23 0326 12/31/23 0327  WBC 12.0* 12.4* 5.9   Microbiology Recent Results (from the past 240 hours)  Culture, blood (routine x 2)     Status: None (Preliminary result)   Collection Time: 12/28/23  5:30 PM   Specimen: BLOOD LEFT ARM  Result Value Ref Range Status   Specimen Description   Final    BLOOD LEFT ARM Performed at [facility], [address], [place], [place] [address]    Special Requests   Final    BOTTLES DRAWN AEROBIC AND ANAEROBIC Blood Culture adequate volume Performed at [facility], [address], [place], [place] [address]    Culture   Final    NO GROWTH 3 DAYS Performed at [facility], [address], [place], [place] [address]    Report Status PENDING  Incomplete  Resp panel by RT-PCR (RSV, Flu A&B, Covid) Anterior Nasal Swab     Status: None   Collection Time: 12/28/23  5:58 PM   Specimen: Anterior Nasal Swab  Result Value Ref Range Status   SARS Coronavirus 2 by RT PCR NEGATIVE NEGATIVE Final    Comment: (NOTE) SARS-CoV-2 target nucleic acids are NOT  DETECTED.  The SARS-CoV-2 RNA is generally detectable in upper respiratory specimens during the acute phase of infection. The lowest concentration of SARS-CoV-2 viral copies this assay can detect is 138 copies/mL. A negative result does not preclude SARS-Cov-2 infection and should not be used as the sole basis for treatment or other patient management decisions. A negative result may occur with  improper specimen collection/handling, submission of specimen other than nasopharyngeal swab, presence of viral mutation(s) within the areas targeted by this assay, and inadequate number of viral copies(<138 copies/mL). A negative result must be combined with clinical observations, patient history, and epidemiological information. The expected result is Negative.  Fact Sheet for Patients:  bloggercourse.com  Fact Sheet for Healthcare Providers:  seriousbroker.it  This test is no t yet approved or cleared by the [place]  FDA and  has been authorized for detection and/or diagnosis of SARS-CoV-2 by FDA under an Emergency Use Authorization (EUA). This EUA will remain  in effect (meaning this test can be used) for the duration of the COVID-19 declaration under Section 564(b)(1) of the Act, 21 U.S.C.section 360bbb-3(b)(1), unless the authorization is terminated  or revoked sooner.       Influenza A by PCR NEGATIVE NEGATIVE Final   Influenza B by PCR NEGATIVE NEGATIVE Final    Comment: (NOTE) The Xpert Xpress SARS-CoV-2/FLU/RSV plus assay is intended as an aid in the diagnosis of influenza from Nasopharyngeal swab specimens and should not be used as a sole basis for treatment. Nasal washings and aspirates are unacceptable for Xpert Xpress SARS-CoV-2/FLU/RSV testing.  Fact Sheet for Patients: bloggercourse.com  Fact Sheet for Healthcare Providers: seriousbroker.it  This test is not yet  approved  or cleared by the [place]  FDA and has been authorized for detection and/or diagnosis of SARS-CoV-2 by FDA under an Emergency Use Authorization (EUA). This EUA will remain in effect (meaning this test can be used) for the duration of the COVID-19 declaration under Section 564(b)(1) of the Act, 21 U.S.C. section 360bbb-3(b)(1), unless the authorization is terminated or revoked.     Resp Syncytial Virus by PCR NEGATIVE NEGATIVE Final    Comment: (NOTE) Fact Sheet for Patients: bloggercourse.com  Fact Sheet for Healthcare Providers: seriousbroker.it  This test is not yet approved or cleared by the [place]  FDA and has been authorized for detection and/or diagnosis of SARS-CoV-2 by FDA under an Emergency Use Authorization (EUA). This EUA will remain in effect (meaning this test can be used) for the duration of the COVID-19 declaration under Section 564(b)(1) of the Act, 21 U.S.C. section 360bbb-3(b)(1), unless the authorization is terminated or revoked.  Performed at [facility], [address], [place], [place] [address]   Culture, blood (routine x 2)     Status: None (Preliminary result)   Collection Time: 12/28/23  7:28 PM   Specimen: BLOOD  Result Value Ref Range Status   Specimen Description   Final    BLOOD RIGHT ANTECUBITAL Performed at [facility], [address] [place], [place] [address]    Special Requests   Final    BOTTLES DRAWN AEROBIC AND ANAEROBIC Blood Culture results may not be optimal due to an inadequate volume of blood received in culture bottles Performed at [facility], [address] [place], [place] [address]    Culture   Final    NO GROWTH 3 DAYS Performed at [facility], [address], [place], [place] [address]    Report Status PENDING  Incomplete  MRSA Next Gen by PCR, Nasal     Status: None   Collection Time: 12/29/23   1:55 AM   Specimen: Nasal Mucosa; Nasal Swab  Result Value Ref Range Status   MRSA by PCR Next Gen NOT DETECTED NOT DETECTED Final    Comment: (NOTE) The GeneXpert MRSA Assay (FDA approved for NASAL specimens only), is one component of a comprehensive MRSA colonization surveillance program. It is not intended to diagnose MRSA infection nor to guide or monitor treatment for MRSA infections. Test performance is not FDA approved in patients less than 80 years old. Performed at [facility], [address] [place], [place] [address]      Time coordinating discharge:  I have spent 35 minutes face to face with the patient and on the ward discussing the patients care, assessment, plan and disposition with other care givers. >50% of the time was devoted counseling the patient about the risks and benefits of treatment/Discharge disposition and coordinating care.   SIGNED:   Burgess JAYSON Dare, MD  [facility] 12/31/2023, 12:13 PM   If 7PM-7AM, please contact night-coverage      [1] No Known Allergies

## 2023-12-31 NOTE — TOC Initial Note (Signed)
 Transition of Care [facility]) - Initial/Assessment Note    Patient Details  Name: Christy Barnett MRN: [id number] Date of Birth: 27-Mar-1970  Transition of Care [facility]) CM/SW Contact:    Sonda Manuella Quill, RN Phone Number: 12/31/2023, 11:29 AM  Clinical Narrative:                 Beatris w/ pt's sister Macario Benders [phone]); she said pt lives at home; family plans for her to return w/ their support at d/c; she said pt has transportation; insurance/PCP verified; she denied pt experiencing SDOH risks; pt does not have DME, HH services, or home oxygen; no IP CM needs.  Expected Discharge Plan: Home/Self Care Barriers to Discharge: No Barriers Identified   Patient Goals and CMS Choice Patient states their goals for this hospitalization and ongoing recovery are:: patient's sister Macario Benders said pt will return home          Expected Discharge Plan and Services   Discharge Planning Services: CM Consult   Living arrangements for the past 2 months: Apartment Expected Discharge Date: 12/31/23               DME Arranged: N/A DME Agency: NA       HH Arranged: NA HH Agency: NA        Prior Living Arrangements/Services Living arrangements for the past 2 months: Apartment Lives with:: Adult Children Patient language and need for interpreter reviewed:: Yes Do you feel safe going back to the place where you live?: Yes      Need for Family Participation in Patient Care: Yes (Comment) Care giver support system in place?: Yes (comment) Current home services:  (n/a) Criminal Activity/Legal Involvement Pertinent to Current Situation/Hospitalization: No - Comment as needed  Activities of Daily Living   ADL Screening (condition at time of admission) Independently performs ADLs?: Yes (appropriate for developmental age) Is the patient deaf or have difficulty hearing?: No Does the patient have difficulty seeing, even when wearing glasses/contacts?: No Does the patient have  difficulty concentrating, remembering, or making decisions?: No  Permission Sought/Granted Permission sought to share information with : Case Manager Permission granted to share information with : Yes, Verbal Permission Granted  Share Information with NAME: Case Manager     Permission granted to share info w Relationship: Macario Benders (sister) [phone]     Emotional Assessment Appearance:: Other (Comment Required (unable to assess) Attitude/Demeanor/Rapport: Unable to Assess Affect (typically observed): Unable to Assess Orientation: :  (unable to assess) Alcohol / Substance Use: Not Applicable Psych Involvement: No (comment)  Admission diagnosis:  Dehydration [E86.0] Hypokalemia [E87.6] Hypomagnesemia [E83.42] Opioid withdrawal (HCC) [F11.93] Opiate withdrawal (HCC) [F11.93] Hypertension, unspecified type [I10] Sepsis, due to unspecified organism, unspecified whether acute organ dysfunction present (HCC) [A41.9] Nausea and vomiting, unspecified vomiting type [R11.2] Patient Active Problem List   Diagnosis Date Noted   Nausea & vomiting 12/29/2023   SIRS (systemic inflammatory response syndrome) (HCC) 12/29/2023   Esophagitis 12/29/2023   Elevated blood pressure reading 12/29/2023   Hypokalemia 12/29/2023   Hypomagnesemia 12/29/2023   Lactic acidosis 12/29/2023   Tobacco abuse 12/29/2023   Opioid withdrawal (HCC) 12/29/2023   Opiate withdrawal (HCC) 12/28/2023   Cholecystitis 11/14/2023   Hepatitis C antibody positive in blood 06/23/2021   History of syphilis 06/23/2021   Acute respiratory failure with hypoxia (HCC) 06/12/2021   Hyperkalemia 06/12/2021   Moderate asthma with acute exacerbation 06/08/2021   Asthma 06/08/2021   Opioid abuse with opioid-induced mood disorder (HCC) 10/30/2017  Bilateral lower extremity edema 07/16/2017   Heart murmur 07/16/2017   Bipolar affective disorder in remission 07/16/2017   PTSD (post-traumatic stress disorder) 07/16/2017    History of substance abuse (HCC) 07/16/2017   Family history of DVT 07/16/2017   Anemia 07/16/2017   PCP:  [facility]:   [id number] [address] [place] [address] [place] [address] Phone: [phone] Fax: [phone]  [facility] [address] [place] [address] Phone: [phone] Fax: [phone]  [facility] - [facility] [address] [place] [address] Phone: [phone] Fax: [phone]     Social Drivers of Health (SDOH) Social History: SDOH Screenings   Food Insecurity: No Food Insecurity (12/31/2023)  Housing: Low Risk (12/31/2023)  Transportation Needs: No Transportation Needs (12/31/2023)  Utilities: Not At Risk (12/31/2023)  Depression (PHQ2-9): Low Risk (06/23/2021)  Tobacco Use: High Risk (12/29/2023)   SDOH Interventions: Food Insecurity Interventions: Intervention Not Indicated, Inpatient TOC Housing Interventions: Intervention Not Indicated, Inpatient TOC Transportation Interventions: Intervention Not Indicated, Inpatient TOC Utilities Interventions: Intervention Not Indicated, Inpatient TOC   Readmission Risk Interventions    12/31/2023   11:26 AM  Readmission Risk Prevention Plan  Transportation Screening Complete  PCP or Specialist Appt within 5-7 Days Complete  Home Care Screening Complete  Medication Review (RN CM) Complete

## 2023-12-31 NOTE — TOC Progression Note (Addendum)
 Transition of Care [facility]) - Progression Note    Patient Details  Name: Christy Barnett MRN: [id number] Date of Birth: Jan 29, 1970  Transition [facility]) CM/SW Contact  Sonda Manuella Quill, RN Phone Number: 12/31/2023, 11:16 AM  Clinical Narrative:    Pt d/c'd before assessment completed; LVM for her at [phone]; also attempted to contact her sister Macario Benders ([phone]); left message w/ her husband Thresa Benders; awaiting return call.  [phone] return call from pt's sister; see initial note.                    Expected Discharge Plan and Services         Expected Discharge Date: 12/31/23                                     Social Drivers of Health (SDOH) Interventions SDOH Screenings   Food Insecurity: No Food Insecurity (12/29/2023)  Housing: Low Risk (12/29/2023)  Transportation Needs: No Transportation Needs (12/29/2023)  Utilities: Not At Risk (12/29/2023)  Depression (PHQ2-9): Low Risk (06/23/2021)  Tobacco Use: High Risk (12/29/2023)    Readmission Risk Interventions     No data to display

## 2023-12-31 NOTE — Progress Notes (Signed)
 PHARMACIST - PHYSICIAN COMMUNICATION  DR:   Caleen  CONCERNING: IV to Oral Route Change Policy  RECOMMENDATION: This patient is receiving pantoprazole  by the intravenous route.  Based on criteria approved by the Pharmacy and Therapeutics Committee, the intravenous medication(s) is/are being converted to the equivalent oral dose form(s).   DESCRIPTION: These criteria include: The patient is eating (either orally or via tube) and/or has been taking other orally administered medications for a least 24 hours The patient has no evidence of active gastrointestinal bleeding or impaired GI absorption (gastrectomy, short bowel, patient on TNA or NPO).  If you have questions about this conversion, please contact the Pharmacy Department  []   [phone] )  [facility] []   [phone] )  [facility] []   [phone] )  [facility] []   [phone] )  [facility] [x]   [phone] )  [facility]   [place], [facility] 12/31/2023 7:35 AM

## 2023-12-31 NOTE — Progress Notes (Signed)
 Discharge Medications delivered from TOC meds to bed [facility] outpatient pharmacy by this RN.

## 2023-12-31 NOTE — Plan of Care (Signed)
°  Problem: Education: Goal: Knowledge of General Education information will improve Description: Including pain rating scale, medication(s)/side effects and non-pharmacologic comfort measures 12/31/2023 1006 by Alaina Dozier PARAS, RN Outcome: Adequate for Discharge 12/31/2023 1006 by Alaina Dozier PARAS, RN Outcome: Not Progressing   Problem: Health Behavior/Discharge Planning: Goal: Ability to manage health-related needs will improve 12/31/2023 1006 by Alaina Dozier PARAS, RN Outcome: Adequate for Discharge 12/31/2023 1006 by Alaina Dozier PARAS, RN Outcome: Progressing   Problem: Clinical Measurements: Goal: Ability to maintain clinical measurements within normal limits will improve 12/31/2023 1006 by Alaina Dozier PARAS, RN Outcome: Adequate for Discharge 12/31/2023 1006 by Alaina Dozier PARAS, RN Outcome: Progressing Goal: Will remain free from infection 12/31/2023 1006 by Alaina Dozier PARAS, RN Outcome: Adequate for Discharge 12/31/2023 1006 by Alaina Dozier PARAS, RN Outcome: Progressing Goal: Diagnostic test results will improve Outcome: Adequate for Discharge Goal: Respiratory complications will improve Outcome: Adequate for Discharge Goal: Cardiovascular complication will be avoided Outcome: Adequate for Discharge   Problem: Activity: Goal: Risk for activity intolerance will decrease Outcome: Adequate for Discharge   Problem: Nutrition: Goal: Adequate nutrition will be maintained Outcome: Adequate for Discharge   Problem: Coping: Goal: Level of anxiety will decrease Outcome: Adequate for Discharge   Problem: Elimination: Goal: Will not experience complications related to bowel motility Outcome: Adequate for Discharge Goal: Will not experience complications related to urinary retention Outcome: Adequate for Discharge   Problem: Pain Managment: Goal: General experience of comfort will improve and/or be controlled Outcome: Adequate for  Discharge   Problem: Safety: Goal: Ability to remain free from injury will improve Outcome: Adequate for Discharge   Problem: Skin Integrity: Goal: Risk for impaired skin integrity will decrease Outcome: Adequate for Discharge

## 2024-01-02 LAB — CULTURE, BLOOD (ROUTINE X 2)
Culture: NO GROWTH
Culture: NO GROWTH
Special Requests: ADEQUATE

## 2024-01-05 ENCOUNTER — Emergency Department (HOSPITAL_COMMUNITY)
Admission: EM | Admit: 2024-01-05 | Discharge: 2024-01-05 | Discharge status: Against Medical Advice | Payer: MEDICAID | Attending: Emergency Medicine | Admitting: Emergency Medicine

## 2024-01-05 ENCOUNTER — Encounter (HOSPITAL_COMMUNITY): Payer: Self-pay

## 2024-01-05 ENCOUNTER — Other Ambulatory Visit: Payer: Self-pay

## 2024-01-05 DIAGNOSIS — Z5321 Procedure and treatment not carried out due to patient leaving prior to being seen by health care provider: Secondary | ICD-10-CM | POA: Insufficient documentation

## 2024-01-05 DIAGNOSIS — R112 Nausea with vomiting, unspecified: Secondary | ICD-10-CM | POA: Insufficient documentation

## 2024-01-05 LAB — CBC WITH DIFFERENTIAL/PLATELET
Abs Immature Granulocytes: 0.01 K/uL (ref 0.00–0.07)
Basophils Absolute: 0.1 K/uL (ref 0.0–0.1)
Basophils Relative: 1 %
Eosinophils Absolute: 0.5 K/uL (ref 0.0–0.5)
Eosinophils Relative: 8 %
HCT: 38.5 % (ref 36.0–46.0)
Hemoglobin: 12.6 g/dL (ref 12.0–15.0)
Immature Granulocytes: 0 %
Lymphocytes Relative: 42 %
Lymphs Abs: 2.4 K/uL (ref 0.7–4.0)
MCH: 28.6 pg (ref 26.0–34.0)
MCHC: 32.7 g/dL (ref 30.0–36.0)
MCV: 87.3 fL (ref 80.0–100.0)
Monocytes Absolute: 0.6 K/uL (ref 0.1–1.0)
Monocytes Relative: 11 %
Neutro Abs: 2.2 K/uL (ref 1.7–7.7)
Neutrophils Relative %: 38 %
Platelets: 277 K/uL (ref 150–400)
RBC: 4.41 MIL/uL (ref 3.87–5.11)
RDW: 13.1 % (ref 11.5–15.5)
WBC: 5.6 K/uL (ref 4.0–10.5)
nRBC: 0 % (ref 0.0–0.2)

## 2024-01-05 LAB — COMPREHENSIVE METABOLIC PANEL WITH GFR
ALT: 14 U/L (ref 0–44)
AST: 23 U/L (ref 15–41)
Albumin: 4.1 g/dL (ref 3.5–5.0)
Alkaline Phosphatase: 91 U/L (ref 38–126)
Anion gap: 10 (ref 5–15)
BUN: 8 mg/dL (ref 6–20)
CO2: 28 mmol/L (ref 22–32)
Calcium: 9.5 mg/dL (ref 8.9–10.3)
Chloride: 101 mmol/L (ref 98–111)
Creatinine, Ser: 0.71 mg/dL (ref 0.44–1.00)
GFR, Estimated: >60 mL/min (ref >60)
Glucose, Bld: 135 mg/dL — ABNORMAL HIGH (ref 70–99)
Potassium: 4.3 mmol/L (ref 3.5–5.1)
Sodium: 139 mmol/L (ref 135–145)
Total Bilirubin: 0.2 mg/dL (ref 0.0–1.2)
Total Protein: 6.7 g/dL (ref 6.5–8.1)

## 2024-01-05 LAB — LIPASE, BLOOD: Lipase: 21 U/L (ref 11–51)

## 2024-01-05 NOTE — ED Triage Notes (Addendum)
 Pt BIB EMS from home with reports of nausea and vomiting since yesterday. Pt states that she is detoxing off of fentanyl . Pt last used yesterday.   Zofran  IM 4 mg  Cbg 160

## 2024-01-13 ENCOUNTER — Encounter (HOSPITAL_COMMUNITY): Payer: Self-pay | Admitting: Emergency Medicine

## 2024-01-13 ENCOUNTER — Emergency Department (HOSPITAL_COMMUNITY): Payer: MEDICAID

## 2024-01-13 ENCOUNTER — Emergency Department (HOSPITAL_COMMUNITY)
Admission: EM | Admit: 2024-01-13 | Discharge: 2024-01-13 | Disposition: A | Discharge status: Home / Self Care | Payer: MEDICAID | Attending: Emergency Medicine | Admitting: Emergency Medicine

## 2024-01-13 ENCOUNTER — Other Ambulatory Visit: Payer: Self-pay

## 2024-01-13 DIAGNOSIS — F1123 Opioid dependence with withdrawal: Secondary | ICD-10-CM | POA: Diagnosis present

## 2024-01-13 DIAGNOSIS — Z79899 Other long term (current) drug therapy: Secondary | ICD-10-CM | POA: Diagnosis not present

## 2024-01-13 DIAGNOSIS — E876 Hypokalemia: Secondary | ICD-10-CM | POA: Diagnosis not present

## 2024-01-13 DIAGNOSIS — I10 Essential (primary) hypertension: Secondary | ICD-10-CM | POA: Insufficient documentation

## 2024-01-13 DIAGNOSIS — F1193 Opioid use, unspecified with withdrawal: Secondary | ICD-10-CM

## 2024-01-13 DIAGNOSIS — F419 Anxiety disorder, unspecified: Secondary | ICD-10-CM | POA: Insufficient documentation

## 2024-01-13 LAB — CBC WITH DIFFERENTIAL/PLATELET
Abs Immature Granulocytes: 0.01 K/uL (ref 0.00–0.07)
Basophils Absolute: 0 K/uL (ref 0.0–0.1)
Basophils Relative: 1 %
Eosinophils Absolute: 0.1 K/uL (ref 0.0–0.5)
Eosinophils Relative: 1 %
HCT: 38.4 % (ref 36.0–46.0)
Hemoglobin: 12.7 g/dL (ref 12.0–15.0)
Immature Granulocytes: 0 %
Lymphocytes Relative: 25 %
Lymphs Abs: 1.1 K/uL (ref 0.7–4.0)
MCH: 28.3 pg (ref 26.0–34.0)
MCHC: 33.1 g/dL (ref 30.0–36.0)
MCV: 85.7 fL (ref 80.0–100.0)
Monocytes Absolute: 0.3 K/uL (ref 0.1–1.0)
Monocytes Relative: 8 %
Neutro Abs: 3 K/uL (ref 1.7–7.7)
Neutrophils Relative %: 65 %
Platelets: 273 K/uL (ref 150–400)
RBC: 4.48 MIL/uL (ref 3.87–5.11)
RDW: 13 % (ref 11.5–15.5)
WBC: 4.5 K/uL (ref 4.0–10.5)
nRBC: 0 % (ref 0.0–0.2)

## 2024-01-13 LAB — BASIC METABOLIC PANEL WITH GFR
Anion gap: 11 (ref 5–15)
BUN: 7 mg/dL (ref 6–20)
CO2: 26 mmol/L (ref 22–32)
Calcium: 9.3 mg/dL (ref 8.9–10.3)
Chloride: 105 mmol/L (ref 98–111)
Creatinine, Ser: 0.6 mg/dL (ref 0.44–1.00)
GFR, Estimated: >60 mL/min
Glucose, Bld: 114 mg/dL — ABNORMAL HIGH (ref 70–99)
Potassium: 3.1 mmol/L — ABNORMAL LOW (ref 3.5–5.1)
Sodium: 142 mmol/L (ref 135–145)

## 2024-01-13 LAB — TROPONIN T, HIGH SENSITIVITY: Troponin T High Sensitivity: <15 ng/L (ref 0–19)

## 2024-01-13 MED ORDER — BUPRENORPHINE HCL-NALOXONE HCL 8-2 MG SL SUBL: 1.0000 | SUBLINGUAL_TABLET | Freq: Two times a day (BID) | SUBLINGUAL | Status: DC

## 2024-01-13 MED ORDER — POTASSIUM CHLORIDE 10 MEQ/100ML IV SOLN
10.0000 meq | INTRAVENOUS | Status: DC
  Administered 2024-01-13: 10 meq via INTRAVENOUS
  Filled 2024-01-13 (×2): qty 100

## 2024-01-13 MED ORDER — CLONIDINE HCL 0.1 MG PO TABS: ORAL_TABLET | ORAL | 0 refills | Status: DC

## 2024-01-13 MED ORDER — CLONIDINE HCL 0.1 MG PO TABS
0.1000 mg | ORAL_TABLET | Freq: Once | ORAL | Status: AC
  Administered 2024-01-13: 0.1 mg via ORAL
  Filled 2024-01-13: qty 1

## 2024-01-13 MED ORDER — POTASSIUM CHLORIDE CRYS ER 20 MEQ PO TBCR
40.0000 meq | EXTENDED_RELEASE_TABLET | Freq: Once | ORAL | Status: DC
  Filled 2024-01-13: qty 2

## 2024-01-13 MED ORDER — BUPRENORPHINE HCL-NALOXONE HCL 2-0.5 MG SL SUBL: 2.0000 | SUBLINGUAL_TABLET | SUBLINGUAL | Status: DC | PRN

## 2024-01-13 MED ORDER — BUPRENORPHINE HCL-NALOXONE HCL 8-2 MG SL SUBL
1.0000 | SUBLINGUAL_TABLET | Freq: Once | SUBLINGUAL | Status: DC
  Filled 2024-01-13: qty 1

## 2024-01-13 MED ORDER — SODIUM CHLORIDE 0.9 % IV BOLUS
1000.0000 mL | Freq: Once | INTRAVENOUS | Status: AC
  Administered 2024-01-13: 1000 mL via INTRAVENOUS

## 2024-01-13 MED ORDER — POTASSIUM CHLORIDE 10 MEQ/100ML IV SOLN
10.0000 meq | Freq: Once | INTRAVENOUS | Status: AC
  Administered 2024-01-13: 10 meq via INTRAVENOUS

## 2024-01-13 NOTE — ED Triage Notes (Signed)
 Pt BIB EMS from home, states that she is detoxing off of fentanyl . Pt last used 3 days ago. Generalized pain all over.     BP 184/100 P 100 SpO2 98%

## 2024-01-13 NOTE — ED Notes (Signed)
 CSW reviewed consult and added substance abuse counseling/ education and other resources regarding the risk of Intentional and accidental overdose to AVS.

## 2024-01-13 NOTE — ED Provider Notes (Signed)
 "  EMERGENCY DEPARTMENT AT [facility] Provider Note   CSN: [id number] Arrival date & time: 01/13/24  1203     Patient presents with: Withdrawal   Christy Barnett is a 53 y.o. female who presented to the ED today with concerns of opioid withdrawal.  She is previously admitted secondary to opiate withdrawal and discharged on 12th December with a clonidine  taper.  She states that 3 days ago she used fentanyl  and today has globalized pain as well as chest pain, shortness of breath, diaphoresis.  Comes in by EMS for the same, noted moderate hypertension though has a history of primary hypertension.  Further previous medical diagnoses are noted of cholecystitis, PTSD, and bipolar disorder.   HPI     Prior to Admission medications  Medication Sig Start Date End Date Taking? Authorizing Provider  cloNIDine  (CATAPRES ) 0.1 MG tablet Take 1 tablet by mouth 2 times daily in the am and at bedtime. for 1 day, THEN 1 tablet daily before breakfast for 2 days. 01/13/24 01/16/24  Myriam Dorn BROCKS, PA  labetalol  (NORMODYNE ) 200 MG tablet Take 1 tablet (200 mg total) by mouth 2 (two) times daily. 12/31/23   Amin, Ankit C, MD  methocarbamol  (ROBAXIN ) 750 MG tablet Take 1 tablet (750 mg total) by mouth 4 (four) times daily. Patient not taking: Reported on 12/28/2023 11/15/23   Paola Dreama SAILOR, MD  nicotine  (NICODERM CQ  - DOSED IN MG/24 HOURS) 14 mg/24hr patch Place 1 patch (14 mg total) onto the skin daily as needed (smoking cessation). 12/31/23   Caleen Burgess BROCKS, MD  pantoprazole  (PROTONIX ) 40 MG tablet Take 1 tablet (40 mg total) by mouth daily before breakfast. 12/31/23   Amin, Ankit C, MD  polyethylene glycol powder (MIRALAX ) 17 GM/SCOOP powder Take 17 g by mouth daily. Dissolve 1 capful (17g) in 4-8 ounces of liquid and take by mouth daily. Patient not taking: Reported on 12/28/2023 11/15/23   Paola Dreama SAILOR, MD  potassium chloride  SA (K-DUR,KLOR-CON ) 20 MEQ tablet Take 1 tablet  (20 mEq total) by mouth 2 (two) times daily as needed (with lasix ). Patient not taking: Reported on 12/02/2018 08/02/17 12/02/18  Vicenta Maduro, FNP    Allergies: Patient has no known allergies.    Review of Systems  Cardiovascular:  Positive for chest pain.  Musculoskeletal:  Positive for arthralgias and myalgias.  All other systems reviewed and are negative.   Updated Vital Signs BP (!) 177/101   Pulse 90   Temp 98.9 F (37.2 C) (Oral)   Resp 17   SpO2 99%   Physical Exam Vitals and nursing note reviewed.  Constitutional:      General: She is not in acute distress.    Appearance: Normal appearance.  HENT:     Head: Normocephalic and atraumatic.     Mouth/Throat:     Mouth: Mucous membranes are moist.     Pharynx: Oropharynx is clear.  Eyes:     Extraocular Movements: Extraocular movements intact.     Conjunctiva/sclera: Conjunctivae normal.     Pupils: Pupils are equal, round, and reactive to light.  Cardiovascular:     Rate and Rhythm: Normal rate and regular rhythm.     Pulses: Normal pulses.     Heart sounds: Normal heart sounds, S1 normal and S2 normal. No murmur heard.    No friction rub. No gallop.  Pulmonary:     Effort: Pulmonary effort is normal.     Breath sounds: Normal breath sounds and  air entry.  Abdominal:     General: Abdomen is flat. Bowel sounds are normal.     Palpations: Abdomen is soft.  Musculoskeletal:        General: Normal range of motion.     Cervical back: Normal range of motion and neck supple.     Right lower leg: No edema.     Left lower leg: No edema.  Lymphadenopathy:     Cervical: No cervical adenopathy.  Skin:    General: Skin is warm and moist.     Capillary Refill: Capillary refill takes less than 2 seconds.     Coloration: Skin is not pale.  Neurological:     General: No focal deficit present.     Mental Status: She is alert and oriented to person, place, and time. Mental status is at baseline.     GCS: GCS eye subscore  is 4. GCS verbal subscore is 5. GCS motor subscore is 6.  Psychiatric:        Mood and Affect: Affect normal. Mood is anxious.     (all labs ordered are listed, but only abnormal results are displayed) Labs Reviewed  BASIC METABOLIC PANEL WITH GFR - Abnormal; Notable for the following components:      Result Value   Potassium 3.1 (*)    Glucose, Bld 114 (*)    All other components within normal limits  CBC WITH DIFFERENTIAL/PLATELET  TROPONIN T, HIGH SENSITIVITY    EKG: EKG Interpretation Date/Time:  Thursday January 13 2024 14:41:37 EST Ventricular Rate:  91 PR Interval:  150 QRS Duration:  98 QT Interval:  388 QTC Calculation: 478 R Axis:   63  Text Interpretation: Sinus arrhythmia Borderline ST depression, diffuse leads Baseline wander in lead(s) II III aVL aVF V1 V2 V4 No significant change since last tracing Confirmed by Patt Alm DEL ([id number]) on 01/13/2024 5:14:39 PM  Radiology: [facility] Chest Port 1 View Result Date: 01/13/2024 CLINICAL DATA:  Generalized pain while detoxing off of fentanyl . EXAM: PORTABLE CHEST 1 VIEW COMPARISON:  December 28, 2023 FINDINGS: The heart size and mediastinal contours are within normal limits. Both lungs are clear. The visualized skeletal structures are unremarkable. IMPRESSION: No active disease. Electronically Signed   By: Suzen Dials M.D.   On: 01/13/2024 13:14     Procedures   Medications Ordered in the ED  sodium chloride  0.9 % bolus 1,000 mL (0 mLs Intravenous Stopped 01/13/24 1616)  potassium chloride  10 mEq in 100 mL IVPB (10 mEq Intravenous New Bag/Given 01/13/24 1723)  cloNIDine  (CATAPRES ) tablet 0.1 mg (0.1 mg Oral Given 01/13/24 1748)                                    Medical Decision Making Amount and/or Complexity of Data Reviewed Labs: ordered. Radiology: ordered.  Risk Prescription drug management.   Medical Decision Making:   Christy Barnett is a 53 y.o. female who presented to the ED today with concerns  for chest pain as well as opiate withdrawal detailed above.    Patient's presentation is complicated by their history of opiate use disorder.  Complete initial physical exam performed, notably the patient  was alert and oriented in no apparent distress however visibly anxious.  Pulmonary auscultation is unremarkable with good clear equal breath sounds in all lung fields. Reviewed and confirmed nursing documentation for past medical history, family history, social history.  Initial Assessment:   With the patient's presentation of chest pain along with opiate withdrawal, differential includes primarily opiate withdrawal however given complaints of chest pain along with diaphoresis consider possible ACS/STEMI.   Initial Plan:  Obtain UDS to evaluate for polysubstance abuse Screening labs including CBC and Metabolic panel to evaluate for infectious or metabolic etiology of disease.  Urinalysis with reflex culture ordered to evaluate for UTI or relevant urologic/nephrologic pathology.  CXR to evaluate for structural/infectious intrathoracic pathology.  EKG and serial troponin to evaluate for cardiac pathology. Objective evaluation as below reviewed   Initial Study Results:   Laboratory  All laboratory results reviewed without evidence of clinically relevant pathology.   Exceptions include: Potassium is 3.1  EKG EKG was reviewed independently. Rate, rhythm, axis, intervals all examined and without medically relevant abnormality. ST segments without concerns for elevations.    Radiology: All images reviewed independently. Agree with radiology report at this time.   DG Chest Port 1 View Result Date: 01/13/2024 CLINICAL DATA:  Generalized pain while detoxing off of fentanyl . EXAM: PORTABLE CHEST 1 VIEW COMPARISON:  December 28, 2023 FINDINGS: The heart size and mediastinal contours are within normal limits. Both lungs are clear. The visualized skeletal structures are unremarkable. IMPRESSION: No  active disease. Electronically Signed   By: Suzen Dials M.D.   On: 01/13/2024 13:14   CT CHEST ABDOMEN PELVIS W CONTRAST Result Date: 12/28/2023 EXAM: CT CHEST, ABDOMEN AND PELVIS WITH CONTRAST 12/28/2023 09:26:04 PM TECHNIQUE: CT of the chest, abdomen and pelvis was performed with the administration of 100 mL of iohexol  (OMNIPAQUE ) 300 MG/ML solution. Multiplanar reformatted images are provided for review. Automated exposure control, iterative reconstruction, and/or weight based adjustment of the mA/kV was utilized to reduce the radiation dose to as low as reasonably achievable. COMPARISON: CT abdomen and pelvis 11/14/2023. CLINICAL HISTORY: Sepsis. FINDINGS: CHEST: MEDIASTINUM AND LYMPH NODES: Heart and pericardium are unremarkable. The central airways are clear. Small hiatal hernia. Distal esophageal wall thickening suggests esophagitis, possibly related to reflux. No mediastinal, hilar or axillary lymphadenopathy. LUNGS AND PLEURA: No focal consolidation or pulmonary edema. No pleural effusion or pneumothorax. ABDOMEN AND PELVIS: LIVER: The liver is unremarkable. GALLBLADDER AND BILE DUCTS: Prior cholecystectomy. No biliary ductal dilatation. SPLEEN: No acute abnormality. PANCREAS: No acute abnormality. ADRENAL GLANDS: No acute abnormality. KIDNEYS, URETERS AND BLADDER: No stones in the kidneys or ureters. No hydronephrosis. Mild bilateral perinephric stranding. No periureteral stranding. Urinary bladder is unremarkable. GI AND BOWEL: Stomach demonstrates no acute abnormality. There is no bowel obstruction. REPRODUCTIVE ORGANS: No acute abnormality. PERITONEUM AND RETROPERITONEUM: No ascites. No free air. VASCULATURE: Aorta is normal in caliber. ABDOMINAL AND PELVIS LYMPH NODES: No lymphadenopathy. REPRODUCTIVE ORGANS: No acute abnormality. BONES AND SOFT TISSUES: No acute osseous abnormality. No focal soft tissue abnormality. IMPRESSION: 1. Distal esophageal wall thickening suggests esophagitis,  possibly related to reflux. 2. Small hiatal hernia. 3. No acute findings in the abdomen or pelvis. Electronically signed by: Franky Crease MD 12/28/2023 09:32 PM EST RP Workstation: [id number]   DG Chest Portable 1 View Result Date: 12/28/2023 CLINICAL DATA:  Shortness of breath. EXAM: PORTABLE CHEST 1 VIEW COMPARISON:  Chest radiograph dated 03/13/2022. FINDINGS: The heart size and mediastinal contours are within normal limits. Both lungs are clear. The visualized skeletal structures are unremarkable. IMPRESSION: No active disease. Electronically Signed   By: Vanetta Chou M.D.   On: 12/28/2023 18:08    Reassessment and Plan:   Due to hypokalemia patient was provided with a initial course  of oral potassium however patient refused this and was thus transferred to IV therapy.  Cardiac workup did not show any elevated troponin nor were there are any concerning findings on the EKG. given this do not feel the patient has ACS or other concerning cardiac issues at this time.  Patient is managed with initial dose of clonidine , was going to attempt Suboxone  treatment however patient declined this at this time.  After reassessment after clonidine  administration patient's symptoms have improved, and therefore find that this will be a suitable continuation for opioid withdrawal maintenance.  She has been given information by social work after consultation with [facility] for follow-up with outpatient detox for substance abuse.  This was thoroughly discussed with the patient that she needs to continue outpatient management for successful cessation of opiate abuse.  Also discussed not using any other opiates during and after the clonidine  taper.  She understands and agrees has no further concerns at this time.  Since she does not have any concerning findings on the workup that was done today, and that she has had good response to the clonidine  management of her opiate withdrawals, fine she is likely having symptoms consistent  with opiate withdrawal and will continue outpatient management as discussed.       Final diagnoses:  Opioid withdrawal [facility])    ED Discharge Orders          Ordered    cloNIDine  (CATAPRES ) 0.1 MG tablet  Multiple Frequencies        01/13/24 1847               Myriam Dorn BROCKS, [place] 01/13/24 1935  "

## 2024-01-13 NOTE — Discharge Instructions (Addendum)
 Is very important that you follow-up with a counseling center and/or detox center for continued management of your opiate dependence.  Medications have been prescribed to help continue your management of your withdrawals, however is important you do not use any other opiates to prevent further withdrawal symptoms.  As also importantly follow-up with your primary care for redraw of your labs in the next several weeks to reassess your potassium ensure that this is normalized.

## 2024-02-13 ENCOUNTER — Other Ambulatory Visit: Payer: Self-pay

## 2024-02-13 ENCOUNTER — Observation Stay (HOSPITAL_COMMUNITY)
Admission: EM | Admit: 2024-02-13 | Discharge: 2024-02-15 | Disposition: A | Payer: MEDICAID | Attending: Emergency Medicine | Admitting: Emergency Medicine

## 2024-02-13 ENCOUNTER — Encounter (HOSPITAL_COMMUNITY): Payer: Self-pay

## 2024-02-13 DIAGNOSIS — F1123 Opioid dependence with withdrawal: Principal | ICD-10-CM | POA: Insufficient documentation

## 2024-02-13 DIAGNOSIS — J9611 Chronic respiratory failure with hypoxia: Secondary | ICD-10-CM | POA: Insufficient documentation

## 2024-02-13 DIAGNOSIS — K219 Gastro-esophageal reflux disease without esophagitis: Secondary | ICD-10-CM | POA: Diagnosis not present

## 2024-02-13 DIAGNOSIS — R112 Nausea with vomiting, unspecified: Principal | ICD-10-CM

## 2024-02-13 DIAGNOSIS — Z720 Tobacco use: Secondary | ICD-10-CM | POA: Diagnosis not present

## 2024-02-13 DIAGNOSIS — J441 Chronic obstructive pulmonary disease with (acute) exacerbation: Secondary | ICD-10-CM | POA: Insufficient documentation

## 2024-02-13 DIAGNOSIS — F1193 Opioid use, unspecified with withdrawal: Secondary | ICD-10-CM | POA: Diagnosis present

## 2024-02-13 DIAGNOSIS — F319 Bipolar disorder, unspecified: Secondary | ICD-10-CM | POA: Diagnosis not present

## 2024-02-13 DIAGNOSIS — Z79899 Other long term (current) drug therapy: Secondary | ICD-10-CM | POA: Diagnosis not present

## 2024-02-13 DIAGNOSIS — E878 Other disorders of electrolyte and fluid balance, not elsewhere classified: Secondary | ICD-10-CM | POA: Diagnosis not present

## 2024-02-13 DIAGNOSIS — X58XXXA Exposure to other specified factors, initial encounter: Secondary | ICD-10-CM | POA: Diagnosis not present

## 2024-02-13 DIAGNOSIS — T40411A Poisoning by fentanyl or fentanyl analogs, accidental (unintentional), initial encounter: Secondary | ICD-10-CM | POA: Diagnosis not present

## 2024-02-13 DIAGNOSIS — R109 Unspecified abdominal pain: Secondary | ICD-10-CM | POA: Diagnosis present

## 2024-02-13 DIAGNOSIS — F17200 Nicotine dependence, unspecified, uncomplicated: Secondary | ICD-10-CM

## 2024-02-13 DIAGNOSIS — I160 Hypertensive urgency: Secondary | ICD-10-CM | POA: Insufficient documentation

## 2024-02-13 DIAGNOSIS — Z8659 Personal history of other mental and behavioral disorders: Secondary | ICD-10-CM | POA: Diagnosis not present

## 2024-02-13 LAB — ETHANOL: Alcohol, Ethyl (B): <15 mg/dL

## 2024-02-13 LAB — COMPREHENSIVE METABOLIC PANEL WITH GFR
ALT: 24 U/L (ref 0–44)
AST: 47 U/L — ABNORMAL HIGH (ref 15–41)
Albumin: 4.3 g/dL (ref 3.5–5.0)
Alkaline Phosphatase: 123 U/L (ref 38–126)
Anion gap: 18 — ABNORMAL HIGH (ref 5–15)
BUN: 7 mg/dL (ref 6–20)
CO2: 26 mmol/L (ref 22–32)
Calcium: 9.8 mg/dL (ref 8.9–10.3)
Chloride: 93 mmol/L — ABNORMAL LOW (ref 98–111)
Creatinine, Ser: 0.71 mg/dL (ref 0.44–1.00)
GFR, Estimated: >60 mL/min
Glucose, Bld: 101 mg/dL — ABNORMAL HIGH (ref 70–99)
Potassium: 3.8 mmol/L (ref 3.5–5.1)
Sodium: 137 mmol/L (ref 135–145)
Total Bilirubin: 0.7 mg/dL (ref 0.0–1.2)
Total Protein: 7.9 g/dL (ref 6.5–8.1)

## 2024-02-13 LAB — CBC WITH DIFFERENTIAL/PLATELET
Abs Immature Granulocytes: 0.05 10*3/uL (ref 0.00–0.07)
Basophils Absolute: 0.1 10*3/uL (ref 0.0–0.1)
Basophils Relative: 1 %
Eosinophils Absolute: 0 10*3/uL (ref 0.0–0.5)
Eosinophils Relative: 0 %
HCT: 42.9 % (ref 36.0–46.0)
Hemoglobin: 14.6 g/dL (ref 12.0–15.0)
Immature Granulocytes: 0 %
Lymphocytes Relative: 12 %
Lymphs Abs: 1.6 10*3/uL (ref 0.7–4.0)
MCH: 28.4 pg (ref 26.0–34.0)
MCHC: 34 g/dL (ref 30.0–36.0)
MCV: 83.5 fL (ref 80.0–100.0)
Monocytes Absolute: 0.8 10*3/uL (ref 0.1–1.0)
Monocytes Relative: 6 %
Neutro Abs: 11.1 10*3/uL — ABNORMAL HIGH (ref 1.7–7.7)
Neutrophils Relative %: 81 %
Platelets: 417 10*3/uL — ABNORMAL HIGH (ref 150–400)
RBC: 5.14 MIL/uL — ABNORMAL HIGH (ref 3.87–5.11)
RDW: 13.2 % (ref 11.5–15.5)
WBC: 13.6 10*3/uL — ABNORMAL HIGH (ref 4.0–10.5)
nRBC: 0 % (ref 0.0–0.2)

## 2024-02-13 LAB — URINE DRUG SCREEN
Amphetamines: NEGATIVE
Barbiturates: NEGATIVE
Benzodiazepines: NEGATIVE
Cocaine: NEGATIVE
Fentanyl: POSITIVE — AB
Methadone Scn, Ur: NEGATIVE
Opiates: NEGATIVE
Tetrahydrocannabinol: NEGATIVE

## 2024-02-13 LAB — SALICYLATE LEVEL: Salicylate Lvl: <7 mg/dL — ABNORMAL LOW (ref 7.0–30.0)

## 2024-02-13 LAB — ACETAMINOPHEN LEVEL: Acetaminophen (Tylenol), Serum: <10 ug/mL — ABNORMAL LOW (ref 10–30)

## 2024-02-13 LAB — I-STAT CHEM 8, ED
BUN: 7 mg/dL (ref 6–20)
Calcium, Ion: 0.97 mmol/L — ABNORMAL LOW (ref 1.15–1.40)
Chloride: 96 mmol/L — ABNORMAL LOW (ref 98–111)
Creatinine, Ser: 0.6 mg/dL (ref 0.44–1.00)
Glucose, Bld: 105 mg/dL — ABNORMAL HIGH (ref 70–99)
HCT: 45 % (ref 36.0–46.0)
Hemoglobin: 15.3 g/dL — ABNORMAL HIGH (ref 12.0–15.0)
Potassium: 4.3 mmol/L (ref 3.5–5.1)
Sodium: 137 mmol/L (ref 135–145)
TCO2: 29 mmol/L (ref 22–32)

## 2024-02-13 LAB — LIPASE, BLOOD: Lipase: 20 U/L (ref 11–51)

## 2024-02-13 LAB — CBG MONITORING, ED: Glucose-Capillary: 96 mg/dL (ref 70–99)

## 2024-02-13 MED ORDER — CLONIDINE HCL 0.1 MG PO TABS
0.1000 mg | ORAL_TABLET | Freq: Once | ORAL | Status: AC
  Administered 2024-02-13: 0.1 mg via ORAL
  Filled 2024-02-13: qty 1

## 2024-02-13 MED ORDER — CLONIDINE HCL 0.2 MG PO TABS
0.2000 mg | ORAL_TABLET | Freq: Four times a day (QID) | ORAL | Status: AC
  Administered 2024-02-13 – 2024-02-14 (×3): 0.2 mg via ORAL
  Filled 2024-02-13 (×3): qty 1

## 2024-02-13 MED ORDER — LACTATED RINGERS IV BOLUS
1000.0000 mL | Freq: Once | INTRAVENOUS | Status: AC
  Administered 2024-02-13: 1000 mL via INTRAVENOUS

## 2024-02-13 MED ORDER — IPRATROPIUM-ALBUTEROL 0.5-2.5 (3) MG/3ML IN SOLN: 3.0000 mL | Freq: Four times a day (QID) | RESPIRATORY_TRACT | Status: DC | PRN

## 2024-02-13 MED ORDER — LORAZEPAM 2 MG/ML IJ SOLN
1.0000 mg | Freq: Once | INTRAMUSCULAR | Status: AC
  Administered 2024-02-13: 1 mg via INTRAVENOUS
  Filled 2024-02-13: qty 1

## 2024-02-13 MED ORDER — NICOTINE 21 MG/24HR TD PT24
21.0000 mg | MEDICATED_PATCH | Freq: Every day | TRANSDERMAL | Status: DC
  Administered 2024-02-13 – 2024-02-15 (×3): 21 mg via TRANSDERMAL
  Filled 2024-02-13 (×3): qty 1

## 2024-02-13 MED ORDER — ENOXAPARIN SODIUM 40 MG/0.4ML IJ SOSY
40.0000 mg | PREFILLED_SYRINGE | INTRAMUSCULAR | Status: DC
  Administered 2024-02-14 (×2): 40 mg via SUBCUTANEOUS
  Filled 2024-02-13 (×2): qty 0.4

## 2024-02-13 MED ORDER — DROPERIDOL 2.5 MG/ML IJ SOLN
1.2500 mg | Freq: Once | INTRAMUSCULAR | Status: AC
  Administered 2024-02-13: 1.25 mg via INTRAVENOUS
  Filled 2024-02-13: qty 2

## 2024-02-13 MED ORDER — DICYCLOMINE HCL 20 MG PO TABS
20.0000 mg | ORAL_TABLET | Freq: Four times a day (QID) | ORAL | Status: DC | PRN
  Administered 2024-02-14: 20 mg via ORAL
  Filled 2024-02-13 (×2): qty 1

## 2024-02-13 MED ORDER — CLONIDINE HCL 0.2 MG PO TABS: 0.3000 mg | ORAL_TABLET | Freq: Once | ORAL | Status: DC

## 2024-02-13 MED ORDER — DIPHENHYDRAMINE HCL 50 MG/ML IJ SOLN
25.0000 mg | Freq: Once | INTRAMUSCULAR | Status: AC
  Administered 2024-02-13: 25 mg via INTRAVENOUS
  Filled 2024-02-13: qty 1

## 2024-02-13 MED ORDER — PANTOPRAZOLE SODIUM 40 MG PO TBEC
40.0000 mg | DELAYED_RELEASE_TABLET | Freq: Every day | ORAL | Status: DC
  Administered 2024-02-14 – 2024-02-15 (×2): 40 mg via ORAL
  Filled 2024-02-13 (×2): qty 1

## 2024-02-13 MED ORDER — ONDANSETRON HCL 4 MG/2ML IJ SOLN
4.0000 mg | Freq: Four times a day (QID) | INTRAMUSCULAR | Status: DC | PRN
  Administered 2024-02-13 – 2024-02-15 (×4): 4 mg via INTRAVENOUS
  Filled 2024-02-13 (×4): qty 2

## 2024-02-13 MED ORDER — ONDANSETRON HCL 4 MG/2ML IJ SOLN
4.0000 mg | Freq: Once | INTRAMUSCULAR | Status: AC
  Administered 2024-02-13: 4 mg via INTRAVENOUS
  Filled 2024-02-13: qty 2

## 2024-02-13 MED ORDER — LABETALOL HCL 5 MG/ML IV SOLN
10.0000 mg | Freq: Once | INTRAVENOUS | Status: AC
  Administered 2024-02-13: 10 mg via INTRAVENOUS
  Filled 2024-02-13: qty 4

## 2024-02-13 NOTE — H&P (Incomplete)
 " Date: 02/13/2024               Patient Name:  Christy Barnett MRN: [id number]  DOB: October 25, 1970 Age / Sex: 54 y.o., female   PCP: [facility]         Medical Service: Internal Medicine Teaching Service         Attending Physician: Dr. MICAEL Riis Winfrey      First Contact: Alfornia Light, MD    Second Contact: Dr. Missy Sandhoff, MD         Pager Information: First Contact Pager: [phone]   Second Contact Pager: [phone]   SUBJECTIVE   Chief Complaint: Drug Overdose  History of Present Illness: Christy Barnett is a 55 y.o. female with PMH of hypertension, bipolar disorder, opioid use disorder, GERD, history of hepatitis C, and history of syphilis who presented to [facility] ED via EMS for what was believed to be a fentanyl  overdose.  Event timeline was difficult to obtain as the patient stated she had a hazy memory of what occurred during the day.  She states she received fentanyl  from her usual dealer and snorted the fentanyl  around 10 AM this morning with her daughter.  Around 11 AM, she noticed palpitations, abdominal pain, as well as nausea and vomiting, which prompted her to call EMS. On arrival, the patient was diaphoretic, hypertensive with SBP to 180s, and tachycardic.  Per EMS she was not given Narcan  during transit.  It is unclear if the patient received any Narcan , but she denies self administration. In the emergency department, she was given IV fluids, clonidine , and Ativan . The patient states she feels her symptoms are aligned with previous fentanyl  withdrawals, having had a similar episode on 01/13/24.  She endorses abdominal pain, nausea, vomiting, headache, and leg cramping.  She denies chest pain, vision changes, nosebleeds, diarrhea, dysuria, changes in bowel habits, or appetite changes.  ED Course: Labs significant for a positive UDS for fentanyl , WBC 13.6 with left shift of 11.1, hypochloremia, anion gap of 18, elevated AST 47, negative lipase, and negative  salicylate/acetaminophen /ethanol levels.  Received Ativan  1mg , clonidine  0.1mg  x 2 doses, 1L lactated Ringer 's bolus, IV Zofran , droperidol , IV Benadryl  25mg .   Meds:  Patient reported:  Amlodipine  10mg , daily  Clonidine  0.1mg , BID Furosemide  40mg  BID Hydrochlorothiazide  12.5mg  daily Ibuprofen  600mg   QID PRN Labetalol  200mg  BID Methocarbamol  750mg , QID PRN  Nicotine  14mg  daily patch Pantoprazole  40mg  daily MiraLAX  PRN  Active Medications[1]  Past Medical History Hypertension Asthma History of Hepatitis C antibody exposure, self-resolved History of Syphilis, treated  Bipolar Disorder Tobacco Use Disorder   Past Surgical History Past Surgical History:  Procedure Laterality Date   CESAREAN SECTION     CHOLECYSTECTOMY N/A 11/15/2023   Procedure: LAPAROSCOPIC CHOLECYSTECTOMY;  Surgeon: Paola Dreama SAILOR, MD;  Location: [facility];  Service: General;  Laterality: N/A;     Social:  Lives With: her daughter Christy Barnett in [place] Occupation: works at [employer] Support: family, sister Macario  Level of Function: independent PCP:  [facility] with Dr. Claudene  Substances: -Tobacco: 1ppd, currently on nicotine  patch. 39 pack-year history  -Alcohol: denies -Recreational Drug: denies any THC, methampethamine use. Endorses fentanyl  use only, with last use on Christmas Day 2025  Family History:  Family History  Problem Relation Age of Onset   COPD Mother    Heart disease Father    Breast cancer Paternal Aunt    Breast cancer Paternal Aunt    Breast cancer Paternal Aunt  Heart disease Paternal Grandfather      Allergies: Allergies as of 02/13/2024   (No Known Allergies)    Review of Systems: A complete ROS was negative except as per HPI.   OBJECTIVE:   Physical Exam: Blood pressure (!) 170/93, pulse 100, temperature 99 F (37.2 C), temperature source Oral, resp. rate 19, height 5' 1 (1.549 m), weight 77.1 kg, SpO2 97%.  Constitutional: diaphoretic-appearing  female laying in bed, in mild distress HENT: normocephalic atraumatic, mucous membranes dry Eyes: conjunctiva non-erythematous, PERRLA, EOMI Neck: supple, no JVD Cardiovascular: tachycardic, normal rhythm, no m/r/g Pulmonary/Chest: normal work of breathing on room air, lungs clear to auscultation bilaterally Abdominal: soft, non-distended, diffuse tenderness to palpation of abdomen, most tender in the epigastric and periumbilical regions, no guarding, no masses Neurological: alert & oriented x 3, 5/5 strength in bilateral upper and lower extremities MSK: no antecubital injection marks, radial pulses 2+, DP/PT pulses 1+ bilaterally, nonpitting edema of LE bilaterally Skin: warm and dry Psych: normal mood and affect  Labs: CBC    Component Value Date/Time   WBC 13.6 (H) 02/13/2024 1328   RBC 5.14 (H) 02/13/2024 1328   HGB 15.3 (H) 02/13/2024 1333   HGB 13.4 08/02/2017 1045   HCT 45.0 02/13/2024 1333   HCT 38.1 08/02/2017 1045   PLT 417 (H) 02/13/2024 1328   PLT 266 08/02/2017 1045   MCV 83.5 02/13/2024 1328   MCV 85 08/02/2017 1045   MCH 28.4 02/13/2024 1328   MCHC 34.0 02/13/2024 1328   RDW 13.2 02/13/2024 1328   RDW 14.0 08/02/2017 1045   LYMPHSABS 1.6 02/13/2024 1328   MONOABS 0.8 02/13/2024 1328   EOSABS 0.0 02/13/2024 1328   BASOSABS 0.1 02/13/2024 1328     CMP     Component Value Date/Time   NA 137 02/13/2024 1333   NA 144 09/03/2017 1554   K 4.3 02/13/2024 1333   CL 96 (L) 02/13/2024 1333   CO2 26 02/13/2024 1328   GLUCOSE 105 (H) 02/13/2024 1333   BUN 7 02/13/2024 1333   BUN 12 09/03/2017 1554   CREATININE 0.60 02/13/2024 1333   CALCIUM 9.8 02/13/2024 1328   PROT 7.9 02/13/2024 1328   PROT 7.1 08/02/2017 1045   ALBUMIN 4.3 02/13/2024 1328   ALBUMIN 4.4 08/02/2017 1045   AST 47 (H) 02/13/2024 1328   ALT 24 02/13/2024 1328   ALKPHOS 123 02/13/2024 1328   BILITOT 0.7 02/13/2024 1328   BILITOT 0.4 08/02/2017 1045   GFRNONAA >60 02/13/2024 1328   GFRAA >60  10/29/2017 1947    Imaging: No results found.   EKG: personally reviewed my interpretation is sinus tachycardia and LVH with baseline wandering in leads V4. Prior EKGshowing sinus arrhythmia with ST depression in diffuse leads and baseline wander in leads  ASSESSMENT & PLAN:   Assessment & Plan by Problem: Principal Problem:   Opioid withdrawal (HCC)   MERLYN BOLLEN is a 54 y.o. person living with a history of hypertension, bipolar disorder, opioid use disorder, GERD, history of hepatitis C, and history of syphilis who presented with intractable nausea and vomiting and admitted for opioid withdrawal on hospital day 0.  Opioid Withdrawal Intractable Nausea and Vomiting Patient presenting with symptoms of opioid withdrawal secondary to fentanyl  use earlier on 02/13/2024.  COWS 9, indicating mild withdrawal. No reported administrations of Narcan  per patient, EMS, or EDP.  Patient with hypertension, tachycardia, diaphoresis, and intractable nausea and vomiting with abdominal pain POA. Mental status at baseline. Denies SI/HI. There  is a possibility for the fentanyl  to have been laced, either a partial opioid agonist or Narcan  as the UDS was only positive for opioids. This could explain the patient's withdrawal vs overdose. Denied other active substance use or current intravenous drug use. Per chart review, there is remote history of IVDU. WBC 13.6 with a left shift of 11.1, which is likely reactive in the setting of ingestion. Will check blood cultures to rule out sources of bacteremia. - Clonidine  0.2mg  q 6hrs for autonomic hyperactivity secondary to opioid withdrawal  -Telemetry  - Zofran  prn - Blood cultures to rule out bacteremia - Dicyclomine  q 6hrs PRN for abdominal spasms - Monitor CBC/CMP - PT/OT consult - Monitor COWS  -TOC consult for abuse resources  Hypertensive Urgency Initial SBP > 180 on multiple occasions. Above certainly contributing but does have history of HTN with  unclear compliance with home medications. It appears a formal HTN regimen has not been established as some meds (e.g. Labetalol ) have been given by EDP's upon discharge. Will treat and monitor BP with clonidine  for now and resume Amlodipine  if BP remains elevated.  -Trend vitals  Hypochloremia Likely secondary to nausea and vomiting. Will monitor.   Asthma versus COPD  No PFT's on file. Has been treated for asthma exacerbations in the past but has extensive tobacco history with prescriptions for Trelegy and Symbicort  on file. Does not appear to be in exacerbation of either but was mildly hypoxic in low 90's which improved on O2.  -Duonebs PRN -Wean off 2L [place] as tolerated -Continue pulse oximetry to monitor oxygen saturation  -Consider outpatient Pulmonology referral for PFT's  GERD - Resume home pantoprazole   Tobacco Use Disorder - Resume nicotine  patch - Consider tobacco cessation counseling when appropriate and resources at discharge  Best practice: Diet: Normal VTE: Enoxaparin  IVF: 1L LR bolus Code: Full  Disposition planning: Prior to Admission Living Arrangement: Home, living with daughter Christy Barnett Anticipated Discharge Location: Home  Dispo: Admit patient to Observation with expected length of stay less than 2 midnights.  Signed: Cole Eastridge, DO Internal Medicine Resident  02/13/2024, 8:33 PM  On Call pager: [phone]      [1]  No outpatient medications have been marked as taking for the 02/13/24 encounter [facility] Encounter).   "

## 2024-02-13 NOTE — ED Notes (Signed)
 IV team at bedside

## 2024-02-13 NOTE — ED Triage Notes (Signed)
 C/O abd pain,n,v after snorting fentanyl  at 1115. Pt states a new dealer. Axox4. BP 185.

## 2024-02-13 NOTE — Hospital Course (Addendum)
 Stomach hurts. Last emesis 30 min ago.   Legs hurt - crampy.  Social Tobacco - 1 ppd since 14, no alcohol, fentanyl  only drug [place], lives with daughter Works at [employer]  Support: Independent [facility]CP: [facility]  Feels heart pounding, SOB, nauseated.  Has been hospitalized for fentanyl  OD long time ago. Snorts it, does not currently inject it.   Code: Full, OK with blood    1/26 Coughin up mucus x1 hour. Says she Od'd on fentanyl  but doesn't remember how she was feeling. Was not high after fentanyl . She just passed out. No diarrhea; cold sweats. Doesn't drink alcohol. Is asking for some help, asking if she can go to behavioral health. She is interested in residential rehab for fentanyl  but has never done that before. She has tried methadone  for a while and was doing well with it. Suboxone  causes her to withdraw. Prefers to go back to methadone . She didn't have a ride and couldn't afford to keep going back to [facility]adone  clinic [facility] Normally takes clonidine  everyday, unsure of the dose (2 pills bid).   HR 120s Ordered breztri , clonidine  0.2mg  bid to start at 2200 and iv mag replacement

## 2024-02-13 NOTE — ED Notes (Signed)
Pt placed on 2 liters nc.

## 2024-02-13 NOTE — ED Provider Notes (Signed)
 " [place] EMERGENCY DEPARTMENT AT [facility] Provider Note   CSN: [id number] Arrival date & time: 02/13/24  1251     Patient presents with: Abdominal Pain   Christy Barnett is a 54 y.o. female.   54 year old female history of opiate abuse who presents to the emergency department with abdominal pain, nausea, vomiting, sweating after snorting what she thought was fentanyl  at 11 AM this morning.  Daughter is here with similar symptoms to started the same medication.  Says that she does not feel high and immediately afterwards started having severe abdominal pain.  Denies alcohol or other drug use.  No diarrhea.  Was not given Narcan        Prior to Admission medications  Medication Sig Start Date End Date Taking? Authorizing Provider  cloNIDine  (CATAPRES ) 0.1 MG tablet Take 1 tablet by mouth 2 times daily in the am and at bedtime. for 1 day, THEN 1 tablet daily before breakfast for 2 days. 01/13/24 01/16/24  Myriam Dorn BROCKS, PA  labetalol  (NORMODYNE ) 200 MG tablet Take 1 tablet (200 mg total) by mouth 2 (two) times daily. 12/31/23   Amin, Ankit C, MD  methocarbamol  (ROBAXIN ) 750 MG tablet Take 1 tablet (750 mg total) by mouth 4 (four) times daily. Patient not taking: Reported on 12/28/2023 11/15/23   Paola Dreama SAILOR, MD  nicotine  (NICODERM CQ  - DOSED IN MG/24 HOURS) 14 mg/24hr patch Place 1 patch (14 mg total) onto the skin daily as needed (smoking cessation). 12/31/23   Amin, Burgess BROCKS, MD  pantoprazole  (PROTONIX ) 40 MG tablet Take 1 tablet (40 mg total) by mouth daily before breakfast. 12/31/23   Amin, Ankit C, MD  polyethylene glycol powder (MIRALAX ) 17 GM/SCOOP powder Take 17 g by mouth daily. Dissolve 1 capful (17g) in 4-8 ounces of liquid and take by mouth daily. Patient not taking: Reported on 12/28/2023 11/15/23   Paola Dreama SAILOR, MD  potassium chloride  SA (K-DUR,KLOR-CON ) 20 MEQ tablet Take 1 tablet (20 mEq total) by mouth 2 (two) times daily as needed (with  lasix ). Patient not taking: Reported on 12/02/2018 08/02/17 12/02/18  Vicenta Maduro, FNP    Allergies: Patient has no known allergies.    Review of Systems  Updated Vital Signs BP (!) 170/93   Pulse 100   Temp 99 F (37.2 C) (Oral)   Resp 19   Ht 5' 1 (1.549 m)   Wt 77.1 kg   SpO2 97%   BMI 32.12 kg/m   Physical Exam Vitals and nursing note reviewed.  Constitutional:      General: She is not in acute distress.    Appearance: She is well-developed. She is ill-appearing and diaphoretic.  HENT:     Head: Normocephalic and atraumatic.     Right Ear: External ear normal.     Left Ear: External ear normal.     Nose: Nose normal.  Eyes:     Extraocular Movements: Extraocular movements intact.     Conjunctiva/sclera: Conjunctivae normal.     Pupils: Pupils are equal, round, and reactive to light.     Comments: Pupils 5 mm bilateral  Cardiovascular:     Rate and Rhythm: Regular rhythm. Tachycardia present.     Heart sounds: No murmur heard. Pulmonary:     Effort: Pulmonary effort is normal. No respiratory distress.     Breath sounds: Normal breath sounds.  Abdominal:     General: Abdomen is flat. There is no distension.     Palpations: Abdomen is  soft. There is no mass.     Tenderness: There is no abdominal tenderness. There is no guarding.  Musculoskeletal:     Cervical back: Normal range of motion and neck supple.     Right lower leg: No edema.     Left lower leg: No edema.  Skin:    General: Skin is warm.  Neurological:     Mental Status: She is alert and oriented to person, place, and time. Mental status is at baseline.  Psychiatric:        Mood and Affect: Mood normal.     (all labs ordered are listed, but only abnormal results are displayed) Labs Reviewed  COMPREHENSIVE METABOLIC PANEL WITH GFR - Abnormal; Notable for the following components:      Result Value   Chloride 93 (*)    Glucose, Bld 101 (*)    AST 47 (*)    Anion gap 18 (*)    All other  components within normal limits  SALICYLATE LEVEL - Abnormal; Notable for the following components:   Salicylate Lvl <7.0 (*)    All other components within normal limits  ACETAMINOPHEN  LEVEL - Abnormal; Notable for the following components:   Acetaminophen  (Tylenol ), Serum <10 (*)    All other components within normal limits  URINE DRUG SCREEN - Abnormal; Notable for the following components:   Fentanyl  POSITIVE (*)    All other components within normal limits  CBC WITH DIFFERENTIAL/PLATELET - Abnormal; Notable for the following components:   WBC 13.6 (*)    RBC 5.14 (*)    Platelets 417 (*)    Neutro Abs 11.1 (*)    All other components within normal limits  I-STAT CHEM 8, ED - Abnormal; Notable for the following components:   Chloride 96 (*)    Glucose, Bld 105 (*)    Calcium, Ion 0.97 (*)    Hemoglobin 15.3 (*)    All other components within normal limits  ETHANOL  LIPASE, BLOOD  URINALYSIS, ROUTINE W REFLEX MICROSCOPIC  CBC  COMPREHENSIVE METABOLIC PANEL WITH GFR  CBG MONITORING, ED    EKG: EKG Interpretation Date/Time:  Sunday February 13 2024 12:59:25 EST Ventricular Rate:  110 PR Interval:  130 QRS Duration:  91 QT Interval:  336 QTC Calculation: 455 R Axis:   34  Text Interpretation: Sinus tachycardia Probable left atrial enlargement Left ventricular hypertrophy Baseline wander in lead(s) V4 Confirmed by Yolande Charleston [phone]) on 02/13/2024 1:19:01 PM  Radiology: No results found.   Procedures   Medications Ordered in the ED  enoxaparin  (LOVENOX ) injection 40 mg (has no administration in time range)  nicotine  (NICODERM CQ  - dosed in mg/24 hours) patch 21 mg (has no administration in time range)  cloNIDine  (CATAPRES ) tablet 0.2 mg (has no administration in time range)  ondansetron  (ZOFRAN ) injection 4 mg (has no administration in time range)  lactated ringers  bolus 1,000 mL (0 mLs Intravenous Stopped 02/13/24 1512)  ondansetron  (ZOFRAN ) injection 4 mg (4  mg Intravenous Given 02/13/24 1336)  LORazepam  (ATIVAN ) injection 1 mg (1 mg Intravenous Given 02/13/24 1337)  cloNIDine  (CATAPRES ) tablet 0.1 mg (0.1 mg Oral Given 02/13/24 1441)  droperidol  (INAPSINE ) 2.5 MG/ML injection 1.25 mg (1.25 mg Intravenous Given 02/13/24 1522)  diphenhydrAMINE  (BENADRYL ) injection 25 mg (25 mg Intravenous Given 02/13/24 1521)  labetalol  (NORMODYNE ) injection 10 mg (10 mg Intravenous Given 02/13/24 1536)  cloNIDine  (CATAPRES ) tablet 0.1 mg (0.1 mg Oral Given 02/13/24 1630)  lactated ringers  bolus 1,000 mL (0 mLs Intravenous  Stopped 02/13/24 2039)    Clinical Course as of 02/13/24 2049  Sun Feb 13, 2024  1514 Fentanyl (!): POSITIVE [RP]  1938 Dr Marylu from internal medicine consulted for admission [RP]    Clinical Course User Index [RP] Yolande Lamar BROCKS, MD                                 Medical Decision Making Amount and/or Complexity of Data Reviewed Labs: ordered. Decision-making details documented in ED Course.  Risk Prescription drug management. Decision regarding hospitalization.   Christy Barnett is a 54 year old female history of opiate abuse who presents to the emergency department with abdominal pain, nausea, vomiting, sweating after snorting what she thought was fentanyl  at 11 AM this morning.   Initial Ddx:  Fentanyl  intoxication, Narcan  reaction, cocaine intoxication, intra-abdominal abscess/infection  MDM/Course:  Patient presents emergency department with abdominal pain, nausea, and sweating after snorting what she thought was fentanyl  at 11 AM this morning.  There were no reported administrations of Narcan  but the patient presentation appears to be more consistent with withdrawal from fentanyl  rather than an overdose of it.  Could potentially of had a bystander administered it and not told EMS.  Also could have been laced with a partial opiate agonist or Suboxone  and have this reaction.  Also on the differential would be sympathomimetic  ingestion like cocaine.  Patient denies this being a suicide attempt.  Sounds like she was starting this with her daughter who had the exact same thing happen and has been admitted to the hospital.  Was given IV fluids, Ativan , and clonidine .  Upon re-evaluation was still nauseous and given more rounds of nausea medication but had intractable nausea and vomiting and was admitted to the hospital  This patient presents to the ED for concern of complaints listed in HPI, this involves an extensive number of treatment options, and is a complaint that carries with it a high risk of complications and morbidity. Disposition including potential need for admission considered.   Dispo: Admit to Floor  I have reviewed the patients home medications and made adjustments as needed Additional history obtained from daughter Records reviewed Outpatient Clinic Notes The following labs were independently interpreted: Chemistry and show no acute abnormality I personally reviewed and interpreted cardiac monitoring: normal sinus rhythm  I personally reviewed and interpreted the pt's EKG: see above for interpretation   Portions of this note were generated with Dragon dictation software. Dictation errors may occur despite best attempts at proofreading.     Final diagnoses:  Intractable nausea and vomiting  Accidental fentanyl  overdose, initial encounter [facility])    ED Discharge Orders     None          Yolande Lamar BROCKS, MD 02/13/24 2049  "

## 2024-02-14 ENCOUNTER — Observation Stay (HOSPITAL_COMMUNITY): Payer: MEDICAID

## 2024-02-14 ENCOUNTER — Telehealth (HOSPITAL_COMMUNITY): Payer: Self-pay

## 2024-02-14 ENCOUNTER — Other Ambulatory Visit (HOSPITAL_COMMUNITY): Payer: Self-pay

## 2024-02-14 DIAGNOSIS — T40411A Poisoning by fentanyl or fentanyl analogs, accidental (unintentional), initial encounter: Secondary | ICD-10-CM

## 2024-02-14 DIAGNOSIS — F1123 Opioid dependence with withdrawal: Secondary | ICD-10-CM | POA: Diagnosis not present

## 2024-02-14 DIAGNOSIS — E878 Other disorders of electrolyte and fluid balance, not elsewhere classified: Secondary | ICD-10-CM | POA: Diagnosis not present

## 2024-02-14 DIAGNOSIS — J9611 Chronic respiratory failure with hypoxia: Secondary | ICD-10-CM

## 2024-02-14 DIAGNOSIS — R112 Nausea with vomiting, unspecified: Secondary | ICD-10-CM | POA: Diagnosis not present

## 2024-02-14 DIAGNOSIS — Z79899 Other long term (current) drug therapy: Secondary | ICD-10-CM

## 2024-02-14 DIAGNOSIS — Z8659 Personal history of other mental and behavioral disorders: Secondary | ICD-10-CM

## 2024-02-14 DIAGNOSIS — J441 Chronic obstructive pulmonary disease with (acute) exacerbation: Secondary | ICD-10-CM

## 2024-02-14 DIAGNOSIS — I160 Hypertensive urgency: Secondary | ICD-10-CM | POA: Diagnosis not present

## 2024-02-14 LAB — COMPREHENSIVE METABOLIC PANEL WITH GFR
ALT: 40 U/L (ref 0–44)
AST: 84 U/L — ABNORMAL HIGH (ref 15–41)
Albumin: 4.1 g/dL (ref 3.5–5.0)
Alkaline Phosphatase: 150 U/L — ABNORMAL HIGH (ref 38–126)
Anion gap: 12 (ref 5–15)
BUN: 5 mg/dL — ABNORMAL LOW (ref 6–20)
CO2: 30 mmol/L (ref 22–32)
Calcium: 8.9 mg/dL (ref 8.9–10.3)
Chloride: 98 mmol/L (ref 98–111)
Creatinine, Ser: 0.58 mg/dL (ref 0.44–1.00)
GFR, Estimated: >60 mL/min
Glucose, Bld: 146 mg/dL — ABNORMAL HIGH (ref 70–99)
Potassium: 3.3 mmol/L — ABNORMAL LOW (ref 3.5–5.1)
Sodium: 140 mmol/L (ref 135–145)
Total Bilirubin: 0.7 mg/dL (ref 0.0–1.2)
Total Protein: 6.7 g/dL (ref 6.5–8.1)

## 2024-02-14 LAB — CBC
HCT: 39.8 % (ref 36.0–46.0)
Hemoglobin: 13.3 g/dL (ref 12.0–15.0)
MCH: 28.1 pg (ref 26.0–34.0)
MCHC: 33.4 g/dL (ref 30.0–36.0)
MCV: 84 fL (ref 80.0–100.0)
Platelets: 327 10*3/uL (ref 150–400)
RBC: 4.74 MIL/uL (ref 3.87–5.11)
RDW: 13.2 % (ref 11.5–15.5)
WBC: 10.1 10*3/uL (ref 4.0–10.5)
nRBC: 0 % (ref 0.0–0.2)

## 2024-02-14 LAB — BLOOD CULTURE ID PANEL (REFLEXED) - BCID2

## 2024-02-14 LAB — MAGNESIUM: Magnesium: 1.5 mg/dL — ABNORMAL LOW (ref 1.7–2.4)

## 2024-02-14 MED ORDER — CLONIDINE HCL 0.1 MG PO TABS
0.2000 mg | ORAL_TABLET | Freq: Two times a day (BID) | ORAL | Status: DC
  Administered 2024-02-14 – 2024-02-15 (×2): 0.2 mg via ORAL
  Filled 2024-02-14 (×2): qty 2

## 2024-02-14 MED ORDER — POTASSIUM CHLORIDE 20 MEQ PO PACK
40.0000 meq | PACK | Freq: Once | ORAL | Status: AC
  Administered 2024-02-14: 40 meq via ORAL
  Filled 2024-02-14: qty 2

## 2024-02-14 MED ORDER — METOPROLOL TARTRATE 5 MG/5ML IV SOLN
2.5000 mg | Freq: Once | INTRAVENOUS | Status: AC
  Administered 2024-02-14: 2.5 mg via INTRAVENOUS
  Filled 2024-02-14: qty 5

## 2024-02-14 MED ORDER — IBUPROFEN 200 MG PO TABS
400.0000 mg | ORAL_TABLET | Freq: Four times a day (QID) | ORAL | Status: DC | PRN
  Administered 2024-02-14 – 2024-02-15 (×2): 400 mg via ORAL
  Filled 2024-02-14 (×2): qty 2

## 2024-02-14 MED ORDER — AZITHROMYCIN 500 MG PO TABS
500.0000 mg | ORAL_TABLET | Freq: Every day | ORAL | Status: DC
  Administered 2024-02-14 – 2024-02-15 (×2): 500 mg via ORAL
  Filled 2024-02-14: qty 2
  Filled 2024-02-14: qty 1

## 2024-02-14 MED ORDER — CLONIDINE HCL 0.1 MG PO TABS
0.2000 mg | ORAL_TABLET | Freq: Once | ORAL | Status: AC
  Administered 2024-02-14: 0.2 mg via ORAL
  Filled 2024-02-14: qty 2

## 2024-02-14 MED ORDER — BUDESON-GLYCOPYRROL-FORMOTEROL 160-9-4.8 MCG/ACT IN AERO
2.0000 | INHALATION_SPRAY | Freq: Two times a day (BID) | RESPIRATORY_TRACT | Status: DC
  Administered 2024-02-14: 2 via RESPIRATORY_TRACT
  Filled 2024-02-14 (×2): qty 5.9

## 2024-02-14 MED ORDER — MELATONIN 3 MG PO TABS
3.0000 mg | ORAL_TABLET | Freq: Every day | ORAL | Status: DC
  Administered 2024-02-14: 3 mg via ORAL
  Filled 2024-02-14: qty 1

## 2024-02-14 MED ORDER — ACETAMINOPHEN 500 MG PO TABS
1000.0000 mg | ORAL_TABLET | Freq: Three times a day (TID) | ORAL | Status: DC | PRN
  Administered 2024-02-15: 1000 mg via ORAL
  Filled 2024-02-14: qty 2

## 2024-02-14 MED ORDER — PREDNISONE 20 MG PO TABS
40.0000 mg | ORAL_TABLET | Freq: Every day | ORAL | Status: DC
  Administered 2024-02-15: 40 mg via ORAL
  Filled 2024-02-14: qty 2

## 2024-02-14 MED ORDER — IPRATROPIUM-ALBUTEROL 0.5-2.5 (3) MG/3ML IN SOLN: 3.0000 mL | Freq: Four times a day (QID) | RESPIRATORY_TRACT | Status: DC

## 2024-02-14 MED ORDER — POTASSIUM CHLORIDE CRYS ER 20 MEQ PO TBCR
40.0000 meq | EXTENDED_RELEASE_TABLET | Freq: Once | ORAL | Status: DC
  Filled 2024-02-14: qty 2

## 2024-02-14 MED ORDER — MAGNESIUM SULFATE 4 GM/100ML IV SOLN
4.0000 g | Freq: Once | INTRAVENOUS | Status: AC
  Administered 2024-02-14: 4 g via INTRAVENOUS
  Filled 2024-02-14: qty 100

## 2024-02-14 MED ORDER — AMLODIPINE BESYLATE 5 MG PO TABS
5.0000 mg | ORAL_TABLET | Freq: Every day | ORAL | Status: DC
  Administered 2024-02-14 – 2024-02-15 (×3): 5 mg via ORAL
  Filled 2024-02-14 (×3): qty 1

## 2024-02-14 MED ORDER — IPRATROPIUM-ALBUTEROL 0.5-2.5 (3) MG/3ML IN SOLN
3.0000 mL | Freq: Four times a day (QID) | RESPIRATORY_TRACT | Status: AC
  Administered 2024-02-14 (×2): 3 mL via RESPIRATORY_TRACT
  Filled 2024-02-14 (×2): qty 3

## 2024-02-14 NOTE — Progress Notes (Addendum)
 "  HD#0 SUBJECTIVE:  Patient Summary: Christy Barnett is a 54 y.o. with a pertinent PMH of hypertension, bipolar disorder, opioid use disorder, GERD, history of hepatitis C, and history of syphilis who presented with intractable nausea and vomiting and admitted for opioid withdrawal.  Overnight Events: none  Interim History: Patient was seen in the ED and this morning, not in acute distress though visibly diaphoretic and restless. Patient is asking for some help, asking if she can go to behavioral health.  Patient says that she is interested in residential rehabilitation for her opioid use disorder, and has never done this before.  Discussed some medication assisted treatment options with patient, she says that she used methadone  for a while from the [facility], though she did not have a ride and could not afford to keep going back.  Patient says that she is previously tried Suboxone  before, though does not want this because it caused her to go into withdrawal. No other questions or concerns at this time.  OBJECTIVE:  Vital Signs: Vitals:   02/14/24 0353 02/14/24 0500 02/14/24 0615 02/14/24 0630  BP: (!) 160/98 (!) 151/107 (!) 162/139 (!) 177/129  Pulse: 100 90 (!) 107 84  Resp: 20 18 (!) 25 19  Temp: 99.3 F (37.4 C)     TempSrc: Oral     SpO2: 99% 91% 100% 97%  Weight:      Height:       Supplemental O2: Nasal Cannula SpO2: 97 %  Filed Weights   02/13/24 1255  Weight: 77.1 kg   No intake or output data in the 24 hours ending 02/14/24 0734 Net IO Since Admission: No IO data has been entered for this period [02/14/24 0734]  Physical Exam: Physical Exam Constitutional:      General: She is not in acute distress.    Appearance: She is diaphoretic. She is not toxic-appearing.  Cardiovascular:     Rate and Rhythm: Regular rhythm. Tachycardia present.  Pulmonary:     Effort: Pulmonary effort is normal.     Breath sounds: Normal breath sounds.     Comments: Did not hear  any wheezing, though patient not taking deep breaths to avoid cough Abdominal:     General: Abdomen is flat. Bowel sounds are normal. There is no distension.     Tenderness: There is no abdominal tenderness.  Neurological:     Mental Status: She is alert.    Patient Lines/Drains/Airways Status     Active Line/Drains/Airways     Name Placement date Placement time Site Days   Peripheral IV 02/13/24 22 G 2.5 Anterior;Right Forearm 02/13/24  1837  Forearm  1           Pertinent labs and imaging:     Latest Ref Rng & Units 02/14/2024   12:10 AM 02/13/2024    1:33 PM 02/13/2024    1:28 PM  CBC  WBC 4.0 - 10.5 K/uL 10.1   13.6   Hemoglobin 12.0 - 15.0 g/dL 86.6  84.6  85.3   Hematocrit 36.0 - 46.0 % 39.8  45.0  42.9   Platelets 150 - 400 K/uL 327   417        Latest Ref Rng & Units 02/14/2024   12:10 AM 02/13/2024    1:33 PM 02/13/2024    1:28 PM  CMP  Glucose 70 - 99 mg/dL 853  894  898   BUN 6 - 20 mg/dL 5  7  7    Creatinine  0.44 - 1.00 mg/dL 9.41  9.39  9.28   Sodium 135 - 145 mmol/L 140  137  137   Potassium 3.5 - 5.1 mmol/L 3.3  4.3  3.8   Chloride 98 - 111 mmol/L 98  96  93   CO2 22 - 32 mmol/L 30   26   Calcium 8.9 - 10.3 mg/dL 8.9   9.8   Total Protein 6.5 - 8.1 g/dL 6.7   7.9   Total Bilirubin 0.0 - 1.2 mg/dL 0.7   0.7   Alkaline Phos 38 - 126 U/L 150   123   AST 15 - 41 U/L 84   47   ALT 0 - 44 U/L 40   24     No results found.  ASSESSMENT/PLAN:  Assessment: Principal Problem:   Opioid withdrawal (HCC)  Christy Barnett is a 54 y.o. person living with a history of hypertension, bipolar disorder, opioid use disorder, GERD, history of hepatitis C, and history of syphilis who presented with intractable nausea and vomiting and admitted for opioid withdrawal on hospital day 1.  Plan: Opioid Withdrawal Opioid use disorder Intractable Nausea and Vomiting Patient continues to have hypertension, tachycardia, diaphoresis, and vomiting though denies diarrhea.   She says symptomatically her vomiting and nausea has improved since arrival to the ED, mental status at baseline.  Patient says last bout of vomiting was about an hour before assessment, though denies hematemesis or other concerning findings, and nausea is improving. COWS score continues to trend down, leukocytosis improved to 10.1 from 13.6, will continue to follow blood cultures to rule out bacteremia though low suspicion at this time.  Patient is currently amendable to residential rehabilitation, LCSW provided patient with inpatient rehab options, patient is to call facilities for acceptance and availability. - continue telemetry  - continue zofran  PRN - follow blood cultures to rule out bacteremia - Continue dicyclomine  q 6hrs PRN for abdominal spasms - Monitor CBC/CMP - PT/OT consult - Monitor COWS -7 most recent score - TOC consult for abuse resources  COPD exacerbation  Chronic hypoxic respiratory failure Patient was on 5 L O2 on assessment, (which she says she is on at home, unable to find anything in her history about home O2, other than TOC note for home oxygen needs) and was weaned down to 2 L with no change in O2 saturation, will continue to wean as able. Patient says she has been coughing up yellow mucus for the past hour, which is a new finding for her, though likely related to COPD exacerbation.  Will continue to treat this patient for a COPD exacerbation and start her on azithromycin , prednisone  and scheduled DuoNebs.  Also restarted patient's home Breztri . CXR ordered to rule out any active infection, though low suspicion for a CAP given improvement in leukocytosis. - Schedule duonebs every 4 hours for 1 day - restarted home Breztri  - Start azithromycin  500 mg daily for 3 days - Start prednisone  40 mg daily for 5 days - CXR without acute concerns for lobar infection - Wean off 2L [place] as tolerated - Continue pulse oximetry to monitor oxygen saturation  - Consider outpatient  Pulmonology referral for PFT's  Hypertensive Urgency Patient continues to have elevated blood pressures while in the ED, so home amlodipine  was resumed in addition to clonidine  for opioid withdrawal. Pressures continue to be elevated despite this, so one time dose of IV lopressor  was given this morning, with some improvement in pressures. It appears a formal HTN regimen  has not been established, though patient says that she takes clonidine  twice a day for blood pressures at home, so will resume this here. - Trend vitals - start clonidine  0.2mg  BID - Continue home amlodipine  5 mg daily   GERD - continue home pantoprazole    Tobacco Use Disorder - continue nicotine  patch - Consider tobacco cessation counseling when appropriate and resources at discharge  Historical Dx of Bipolar Disorder Unclear history documented in chart. Not currently on any medication, but has had Seroquel and Cymbalta historically prescribed. Mood and affect normal during evaluation with no indications of mania or depression.   Best Practice: Diet: Regular diet IVF: Fluids: LR, Rate: 1000 cc bolus VTE: enoxaparin  (LOVENOX ) injection 40 mg Start: 02/13/24 2100 Code: Full  Disposition planning: Therapy Recs: Pending, DME: other pending eval Family Contact: sister, to be notified. DISPO: Anticipated discharge in 1-3 days to residential inpatient Rehab pending clinical improvement.  Signature: Alfornia Light, DO [facility]  7:34 AM, 02/14/2024  On Call pager [phone]  "

## 2024-02-14 NOTE — ED Notes (Signed)
 Provider messaged related to patients request for sleep aid.

## 2024-02-14 NOTE — Telephone Encounter (Signed)
 Pharmacy Patient Advocate Encounter  Insurance verification completed.    The patient is insured through [employer] [place].     Ran test claim for Breztri  [id number] and it requires a PA with trial of preferred meds: Advair , Dulera, Symbicort .  Ran test claim for Dulera 100-5mcg and the 30-day copay is $4.

## 2024-02-14 NOTE — Progress Notes (Signed)
 SUD resources added to AVS

## 2024-02-14 NOTE — Evaluation (Signed)
 Physical Therapy Brief Evaluation and Discharge Note Patient Details Name: Christy Barnett MRN: [id number] DOB: 1970/02/19 Today's Date: 02/14/2024   History of Present Illness  Pt is 54 year old presented to [facility] on  02/13/24 for abd pain and n/v. Pt with opiod withdrawal.  PMH - drug use, htn, bipolar, hep C, syphilis.  Clinical Impression  Pt doing well with mobility and no further PT needed.  Ready for dc from PT standpoint.        PT Assessment Patient does not need any further PT services  Assistance Needed at Discharge  None    Equipment Recommendations None recommended by PT  Recommendations for Other Services       Precautions/Restrictions Precautions Precautions: None Restrictions Weight Bearing Restrictions Per Provider Order: No        Mobility  Bed Mobility Rolling: Independent Supine/Sidelying to sit: Independent Sit to supine/sidelying: Independent    Transfers Overall transfer level: Independent Equipment used: None                    Ambulation/Gait Ambulation/Gait assistance: Modified independent (Device/Increase time) Gait Distance (Feet): 200 Feet Assistive device: None Gait Pattern/deviations: Decreased stride length Gait Speed: Pace WFL General Gait Details: Pt able to manage rolling portable O2  Home Activity Instructions    Stairs            Modified Rankin (Stroke Patients Only)        Balance Overall balance assessment: No apparent balance deficits (not formally assessed)                        Pertinent Vitals/Pain PT - Brief Vital Signs All Vital Signs Stable: Yes Pain Assessment Pain Assessment: Faces Faces Pain Scale: Hurts a little bit Pain Location: abd Pain Descriptors / Indicators: Aching Pain Intervention(s): Monitored during session     Home Living Family/patient expects to be discharged to:: Private residence Living Arrangements: Children Available Help at Discharge: Family      Home Equipment: None   Additional Comments: Home O2    Prior Function Level of Independence: Independent      UE/LE Assessment   UE ROM/Strength/Tone/Coordination: WFL    LE ROM/Strength/Tone/Coordination: [facility]      Communication   Communication Communication: No apparent difficulties     Cognition Overall Cognitive Status: Appears within functional limits for tasks assessed/performed       General Comments      Exercises     Assessment/Plan    PT Problem List         PT Visit Diagnosis Other abnormalities of gait and mobility (R26.89)    No Skilled PT Patient is modified independent with all activity/mobility;Patient at baseline level of functioning   Co-evaluation                AMPAC 6 Clicks Help needed turning from your back to your side while in a flat bed without using bedrails?: None Help needed moving from lying on your back to sitting on the side of a flat bed without using bedrails?: None Help needed moving to and from a bed to a chair (including a wheelchair)?: None Help needed standing up from a chair using your arms (e.g., wheelchair or bedside chair)?: None Help needed to walk in hospital room?: None Help needed climbing 3-5 steps with a railing? : None 6 Click Score: 24      End of Session Equipment Utilized During Treatment: Oxygen Activity  Tolerance: Patient tolerated treatment well Patient left: in bed;with call bell/phone within reach   PT Visit Diagnosis: Other abnormalities of gait and mobility (R26.89)     Time: 8872-8858 PT Time Calculation (min) (ACUTE ONLY): 14 min  Charges:   PT Evaluation $PT Eval Low Complexity: 1 Low      [facility] PT Acute Rehabilitation Services Office [phone]   [facility]gers ORN [facility]  02/14/2024, 12:28 PM

## 2024-02-14 NOTE — Progress Notes (Signed)
 PHARMACY - PHYSICIAN COMMUNICATION CRITICAL VALUE ALERT - BLOOD CULTURE IDENTIFICATION (BCID)  Christy Barnett is an 54 y.o. female who presented to [facility] on 02/13/2024 with a chief complaint of intractable nausea and vomiting.  Assessment:  1 of 4 bottles MRSE  Name of physician (or Provider) Contacted: Dr. Isobel  Current antibiotics: azithromycin  for AeCOPD  Changes to prescribed antibiotics recommended:  Recommendations accepted by provider - no IV abx needed  Results for orders placed or performed during the hospital encounter of 02/13/24  Blood Culture ID Panel (Reflexed) (Collected: 02/14/2024 12:19 AM)  Result Value Ref Range   Enterococcus faecalis NOT DETECTED NOT DETECTED   Enterococcus Faecium NOT DETECTED NOT DETECTED   Listeria monocytogenes NOT DETECTED NOT DETECTED   Staphylococcus species DETECTED (A) NOT DETECTED   Staphylococcus aureus (BCID) NOT DETECTED NOT DETECTED   Staphylococcus epidermidis DETECTED (A) NOT DETECTED   Staphylococcus lugdunensis NOT DETECTED NOT DETECTED   Streptococcus species NOT DETECTED NOT DETECTED   Streptococcus agalactiae NOT DETECTED NOT DETECTED   Streptococcus pneumoniae NOT DETECTED NOT DETECTED   Streptococcus pyogenes NOT DETECTED NOT DETECTED   A.calcoaceticus-baumannii NOT DETECTED NOT DETECTED   Bacteroides fragilis NOT DETECTED NOT DETECTED   Enterobacterales NOT DETECTED NOT DETECTED   Enterobacter cloacae complex NOT DETECTED NOT DETECTED   Escherichia coli NOT DETECTED NOT DETECTED   Klebsiella aerogenes NOT DETECTED NOT DETECTED   Klebsiella oxytoca NOT DETECTED NOT DETECTED   Klebsiella pneumoniae NOT DETECTED NOT DETECTED   Proteus species NOT DETECTED NOT DETECTED   Salmonella species NOT DETECTED NOT DETECTED   Serratia marcescens NOT DETECTED NOT DETECTED   Haemophilus influenzae NOT DETECTED NOT DETECTED   Neisseria meningitidis NOT DETECTED NOT DETECTED   Pseudomonas aeruginosa NOT DETECTED NOT  DETECTED   Stenotrophomonas maltophilia NOT DETECTED NOT DETECTED   Candida albicans NOT DETECTED NOT DETECTED   Candida auris NOT DETECTED NOT DETECTED   Candida glabrata NOT DETECTED NOT DETECTED   Candida krusei NOT DETECTED NOT DETECTED   Candida parapsilosis NOT DETECTED NOT DETECTED   Candida tropicalis NOT DETECTED NOT DETECTED   Cryptococcus neoformans/gattii NOT DETECTED NOT DETECTED   Methicillin resistance mecA/C DETECTED (A) NOT DETECTED    Rocky Slade, PharmD, BCPS 02/14/2024  8:13 PM

## 2024-02-14 NOTE — Progress Notes (Signed)
 OT Cancellation Note  Patient Details Name: Christy Barnett MRN: [id number] DOB: 01-10-1971   Cancelled Treatment:    Reason Eval/Treat Not Completed: OT screened, no needs identified, will sign off Per PT, patient with no acute OT needs. OT will sign off at this time.   Ronal Gift E. Alecia Doi, OTR/L Acute Rehabilitation Services [phone]   Ronal Gift Salt 02/14/2024, 12:12 PM

## 2024-02-14 NOTE — Plan of Care (Signed)
 COWS = 13, MD notified. Clonidine  PO given as ordered. Yellow MEWS for tachycardia. MD and charge nurse aware.  Problem: Clinical Measurements: Goal: Ability to maintain clinical measurements within normal limits will improve Outcome: Progressing   Problem: Activity: Goal: Risk for activity intolerance will decrease Outcome: Progressing   Problem: Safety: Goal: Ability to remain free from injury will improve Outcome: Progressing   Problem: Coping: Goal: Level of anxiety will decrease Outcome: Not Progressing

## 2024-02-14 NOTE — ED Notes (Signed)
 CCMD called and patient placed on monitor

## 2024-02-14 NOTE — ED Notes (Signed)
 Patient ambulatory to restroom with steady gait and without complaint.

## 2024-02-15 ENCOUNTER — Other Ambulatory Visit (HOSPITAL_COMMUNITY): Payer: Self-pay

## 2024-02-15 DIAGNOSIS — F1193 Opioid use, unspecified with withdrawal: Secondary | ICD-10-CM | POA: Diagnosis not present

## 2024-02-15 DIAGNOSIS — J441 Chronic obstructive pulmonary disease with (acute) exacerbation: Secondary | ICD-10-CM | POA: Diagnosis not present

## 2024-02-15 DIAGNOSIS — K219 Gastro-esophageal reflux disease without esophagitis: Secondary | ICD-10-CM | POA: Diagnosis not present

## 2024-02-15 DIAGNOSIS — Z720 Tobacco use: Secondary | ICD-10-CM | POA: Diagnosis not present

## 2024-02-15 DIAGNOSIS — R112 Nausea with vomiting, unspecified: Secondary | ICD-10-CM | POA: Diagnosis not present

## 2024-02-15 DIAGNOSIS — J9611 Chronic respiratory failure with hypoxia: Secondary | ICD-10-CM | POA: Diagnosis not present

## 2024-02-15 DIAGNOSIS — I10 Essential (primary) hypertension: Secondary | ICD-10-CM

## 2024-02-15 LAB — COMPREHENSIVE METABOLIC PANEL WITH GFR
ALT: 29 U/L (ref 0–44)
AST: 32 U/L (ref 15–41)
Albumin: 3.9 g/dL (ref 3.5–5.0)
Alkaline Phosphatase: 114 U/L (ref 38–126)
Anion gap: 11 (ref 5–15)
BUN: 8 mg/dL (ref 6–20)
CO2: 30 mmol/L (ref 22–32)
Calcium: 9 mg/dL (ref 8.9–10.3)
Chloride: 100 mmol/L (ref 98–111)
Creatinine, Ser: 0.6 mg/dL (ref 0.44–1.00)
GFR, Estimated: >60 mL/min
Glucose, Bld: 99 mg/dL (ref 70–99)
Potassium: 3.5 mmol/L (ref 3.5–5.1)
Sodium: 141 mmol/L (ref 135–145)
Total Bilirubin: 0.5 mg/dL (ref 0.0–1.2)
Total Protein: 6.7 g/dL (ref 6.5–8.1)

## 2024-02-15 LAB — CBC
HCT: 40.6 % (ref 36.0–46.0)
Hemoglobin: 13.6 g/dL (ref 12.0–15.0)
MCH: 28.4 pg (ref 26.0–34.0)
MCHC: 33.5 g/dL (ref 30.0–36.0)
MCV: 84.8 fL (ref 80.0–100.0)
Platelets: 328 10*3/uL (ref 150–400)
RBC: 4.79 MIL/uL (ref 3.87–5.11)
RDW: 13.2 % (ref 11.5–15.5)
WBC: 7.3 10*3/uL (ref 4.0–10.5)
nRBC: 0 % (ref 0.0–0.2)

## 2024-02-15 MED ORDER — FLUTICASONE-SALMETEROL 115-21 MCG/ACT IN AERO
2.0000 | INHALATION_SPRAY | Freq: Two times a day (BID) | RESPIRATORY_TRACT | 0 refills | Status: AC
  Filled 2024-02-15: qty 12, 30d supply, fill #0

## 2024-02-15 MED ORDER — AZITHROMYCIN 500 MG PO TABS
500.0000 mg | ORAL_TABLET | Freq: Every day | ORAL | 0 refills | Status: AC
  Filled 2024-02-15: qty 1, 1d supply, fill #0

## 2024-02-15 MED ORDER — PREDNISONE 20 MG PO TABS
40.0000 mg | ORAL_TABLET | Freq: Every day | ORAL | 0 refills | Status: AC
  Filled 2024-02-15: qty 8, 4d supply, fill #0

## 2024-02-15 MED ORDER — CLONIDINE HCL 0.1 MG PO TABS: 0.1000 mg | ORAL_TABLET | Freq: Two times a day (BID) | ORAL | Status: DC

## 2024-02-15 MED ORDER — AMLODIPINE BESYLATE 10 MG PO TABS
10.0000 mg | ORAL_TABLET | Freq: Every day | ORAL | 0 refills | Status: AC
  Filled 2024-02-15: qty 30, 30d supply, fill #0

## 2024-02-15 MED ORDER — ONDANSETRON 4 MG PO TBDP
4.0000 mg | ORAL_TABLET | Freq: Three times a day (TID) | ORAL | 0 refills | Status: AC | PRN
  Filled 2024-02-15: qty 20, 7d supply, fill #0

## 2024-02-15 MED ORDER — CLONIDINE HCL 0.2 MG PO TABS
0.2000 mg | ORAL_TABLET | Freq: Two times a day (BID) | ORAL | 0 refills | Status: AC
  Filled 2024-02-15: qty 6, 3d supply, fill #0

## 2024-02-15 MED ORDER — IPRATROPIUM-ALBUTEROL 0.5-2.5 (3) MG/3ML IN SOLN: 3.0000 mL | RESPIRATORY_TRACT | Status: DC | PRN

## 2024-02-15 MED ORDER — CLONIDINE HCL 0.1 MG PO TABS: 0.1000 mg | ORAL_TABLET | Freq: Two times a day (BID) | ORAL | Status: AC

## 2024-02-15 NOTE — Discharge Summary (Incomplete)
 "  Name: Christy Barnett MRN: [id number] DOB: 10-16-1970 54 y.o. PCP: [facility]  Date of Admission: 02/13/2024 12:51 PM Date of Discharge: 02/15/2024 Attending Physician: Dr. MICAEL Riis Winfrey  Discharge Diagnosis: 1. Principal Problem:   Opioid withdrawal (HCC) Active Problems:   Accidental fentanyl  overdose (HCC)   Intractable nausea and vomiting  Discharge Medications: Allergies as of 02/15/2024   No Known Allergies      Medication List     PAUSE taking these medications    furosemide  40 MG tablet Wait to take this until your doctor or other care provider tells you to start again. Commonly known as: LASIX  Take 40 mg by mouth 2 (two) times daily.   hydrochlorothiazide  12.5 MG tablet Wait to take this until your doctor or other care provider tells you to start again. Commonly known as: HYDRODIURIL  Take 12.5 mg by mouth daily.   labetalol  200 MG tablet Wait to take this until your doctor or other care provider tells you to start again. Commonly known as: NORMODYNE  Take 1 tablet (200 mg total) by mouth 2 (two) times daily.       STOP taking these medications    methocarbamol  750 MG tablet Commonly known as: ROBAXIN    Oxycodone  HCl 10 MG Tabs   polyethylene glycol powder 17 GM/SCOOP powder Commonly known as: MiraLax    Potassium Chloride  ER 20 MEQ Tbcr       TAKE these medications    amLODipine  10 MG tablet Commonly known as: NORVASC  Take 1 tablet (10 mg total) by mouth daily. Start taking on: February 16, 2024 What changed: when to take this   azithromycin  500 MG tablet Commonly known as: ZITHROMAX  Take 1 tablet (500 mg total) by mouth daily.   cloNIDine  0.2 MG tablet Commonly known as: CATAPRES  Take 1 tablet (0.2 mg total) by mouth 2 (two) times daily. What changed:  medication strength See the new instructions.   fluticasone -salmeterol 115-21 MCG/ACT inhaler Commonly known as: Advair  HFA Inhale 2 puffs into the lungs 2  (two) times daily.   nicotine  14 mg/24hr patch Commonly known as: NICODERM CQ  - dosed in mg/24 hours Place 1 patch (14 mg total) onto the skin daily as needed (smoking cessation).   ondansetron  4 MG disintegrating tablet Commonly known as: ZOFRAN -ODT Take 1 tablet (4 mg total) by mouth every 8 (eight) hours as needed for nausea or vomiting.   pantoprazole  40 MG tablet Commonly known as: PROTONIX  Take 1 tablet (40 mg total) by mouth daily before breakfast.   predniSONE  20 MG tablet Commonly known as: DELTASONE  Take 2 tablets (40 mg total) by mouth daily with breakfast.        Disposition and follow-up:   Christy Barnett was discharged from [facility] in Good condition.  At the hospital follow up visit please address:  1.  Patient would benefit from MAT for her OUD in addition to residential rehab, she is interested in resuming methadone    2.  Labs / imaging needed at time of follow-up: n/a  3.  Pending labs/ test needing follow-up: n/a  Follow-up Appointments:  Follow-up Information     [facility]. Schedule an appointment as soon as possible for a visit.   Contact information: [address] [place] [address] [phone]               Patient plans to pursue residential rehabilitation upon discharge for [facility] Course by problem list: Christy Barnett is  a 54 y.o. person living with a history of hypertension, bipolar disorder, opioid use disorder, GERD, history of hepatitis C, and history of syphilis who presented with intractable nausea and vomiting and admitted for opioid withdrawal and COPD exacerbation,  now being discharged on hospital day 0 with the following pertinent hospital course:  Opioid Withdrawal Opioid use disorder Intractable Nausea and Vomiting Patient presented to the ED via EMS for persistent abdominal pain with nausea and emesis.  Patient was diaphoretic, hypertensive with SBP's to 180s, and  tachycardic.  Patient received IVF, clonidine  and Ativan  in the ED. UDS was positive for fentanyl , WBC of 13.6, hypochloremia elevated AST and an anion gap of 18.  Patient was monitored on COWS protocol for opioid withdrawal, given dicyclomine  as needed for abdominal spasms, Zofran  for nausea, placed on continuous telemetry and blood cultures obtained to rule out bacteremia.  PT and OT was also consulted to evaluate patient need, and determined that patient did not have any acute PT/OT needs.  On reassessment, leukocytosis significantly improved, anion gap closed and AST WNL.  Patient was reevaluated, reported much improved nausea and vomiting, with only one episode of emesis on day of discharge. Patient denied any diarrhea throughout this admission. Patient was asking for placement in behavioral health or residential rehab, [facility] was consulted for substance abuse resources, and LCSW provided patient with list of residential rehabilitations. Patient says she called various facilities but no one answered. Discussed with patient our recommendations to continue pursuing residential rehab options upon discharge.   COPD exacerbation  Chronic hypoxic respiratory failure Patient was on 5 L O2 on assessment, (which she says she is on at home, unable to find anything in her history about home O2, other than TOC note for home oxygen needs) and was weaned down to 2 L with no change in O2 saturation, will continue to wean as able. Patient says she has been coughing up yellow mucus for the past hour, which is a new finding for her, though likely related to COPD exacerbation.  Will continue to treat this patient for a COPD exacerbation and start her on azithromycin , prednisone  and scheduled DuoNebs.  Also restarted patient's home Breztri . CXR ordered to rule out any active infection, though low suspicion for a CAP given improvement in leukocytosis.   Hypertensive Urgency Patient continues to have elevated blood pressures  while in the ED, so home amlodipine  was resumed in addition to clonidine  for opioid withdrawal. Pressures continue to be elevated despite this, so one time dose of IV lopressor  was given this morning, with some improvement in pressures. It appears a formal HTN regimen has not been established, though patient says that she takes clonidine  twice a day for blood pressures at home, so will resume this here.   Stable chronic medical conditions: GERD - continued home pantoprazole  Tobacco use disorder - continued nicotine  patch and provided tobacco cessation counseling resources  Subjective Patient was seen this morning on rounds, speaking on the phone walking around her room.  Patient says that her nausea and abdominal pain has improved since her admission, and only notes 1 bout of emesis earlier this morning.  Patient says that she contacted several residential rehabilitation facilities yesterday, though says that she was unable to get an answer from them.  Patient is concerned that she is coughing up sputum, this has never happened when she was hospitalized for opioid withdrawal.  Discussed with patient that this is likely from her COPD exacerbation, and the medications that we are  giving her will help with this. Patient is asking how her daughter is, says her son came by yesterday to let her know she was intubated. Discussed we have no additional information, as she is not currently our patient. Patient says she is not ready for discharge since she has not found placement in a residential facility. Discussed with patient she can continue to call residential facilities and we encouraged her to continue to seek placement.   Discharge Exam:   BP (!) 174/95 (BP Location: Left Arm)   Pulse (!) 106   Temp 98.8 F (37.1 C) (Oral)   Resp 18   Ht 5' 1 (1.549 m)   Wt 77.1 kg   SpO2 100%   BMI 32.12 kg/m  Discharge exam:  Constitutional:      General: She is not in acute distress, standing up speaking on  the phone    Appearance: She is not toxic-appearing.  Cardiovascular:     Rate and Rhythm: Regular rate and rhythm.  Pulmonary:     Effort: Pulmonary effort is normal.     Breath sounds: CTAB Abdominal:     General: Abdomen is flat. There is no distension.  Neurological:     Mental Status: She is alert  Pertinent Labs, Studies, and Procedures:     Latest Ref Rng & Units 02/15/2024    4:37 AM 02/14/2024   12:10 AM 02/13/2024    1:33 PM  CBC  WBC 4.0 - 10.5 K/uL 7.3  10.1    Hemoglobin 12.0 - 15.0 g/dL 86.3  86.6  84.6   Hematocrit 36.0 - 46.0 % 40.6  39.8  45.0   Platelets 150 - 400 K/uL 328  327         Latest Ref Rng & Units 02/15/2024    4:37 AM 02/14/2024   12:10 AM 02/13/2024    1:33 PM  CMP  Glucose 70 - 99 mg/dL 99  853  894   BUN 6 - 20 mg/dL 8  5  7    Creatinine 0.44 - 1.00 mg/dL 9.39  9.41  9.39   Sodium 135 - 145 mmol/L 141  140  137   Potassium 3.5 - 5.1 mmol/L 3.5  3.3  4.3   Chloride 98 - 111 mmol/L 100  98  96   CO2 22 - 32 mmol/L 30  30    Calcium 8.9 - 10.3 mg/dL 9.0  8.9    Total Protein 6.5 - 8.1 g/dL 6.7  6.7    Total Bilirubin 0.0 - 1.2 mg/dL 0.5  0.7    Alkaline Phos 38 - 126 U/L 114  150    AST 15 - 41 U/L 32  84    ALT 0 - 44 U/L 29  40      DG CHEST PORT 1 VIEW Result Date: 02/14/2024 CLINICAL DATA:  Abdominal pain, cough. EXAM: PORTABLE CHEST 1 VIEW COMPARISON:  01/13/2024, CT chest 12/28/2023. FINDINGS: Trachea is midline. Heart size stable. Lungs are clear. No pleural fluid. IMPRESSION: No acute findings. Electronically Signed   By: Newell Eke M.D.   On: 02/14/2024 10:32   Signed: Idelle Nakai, DO 02/15/2024, 11:00 AM   "

## 2024-02-15 NOTE — Discharge Summary (Addendum)
 "  Name: Christy Barnett MRN: [id number] DOB: 03-13-1970 54 y.o. PCP: [facility]  Date of Admission: 02/13/2024 12:51 PM Date of Discharge:  Attending Physician: Francesco Elsie NOVAK, MD     Discharge Diagnosis: Opioid withdrawal Opioid use disorder Active Problems:   Accidental fentanyl  overdose (HCC)   Intractable nausea and vomiting   COPD exacerbation  Discharge Medications: Allergies as of 02/15/2024   No Known Allergies      Medication List     PAUSE taking these medications    furosemide  40 MG tablet Wait to take this until your doctor or other care provider tells you to start again. Commonly known as: LASIX  Take 40 mg by mouth 2 (two) times daily.   hydrochlorothiazide  12.5 MG tablet Wait to take this until your doctor or other care provider tells you to start again. Commonly known as: HYDRODIURIL  Take 12.5 mg by mouth daily.   labetalol  200 MG tablet Wait to take this until your doctor or other care provider tells you to start again. Commonly known as: NORMODYNE  Take 1 tablet (200 mg total) by mouth 2 (two) times daily.       STOP taking these medications    methocarbamol  750 MG tablet Commonly known as: ROBAXIN    Oxycodone  HCl 10 MG Tabs   polyethylene glycol powder 17 GM/SCOOP powder Commonly known as: MiraLax    Potassium Chloride  ER 20 MEQ Tbcr       TAKE these medications    amLODipine  10 MG tablet Commonly known as: NORVASC  Take 1 tablet (10 mg total) by mouth daily. Start taking on: February 16, 2024 What changed: when to take this   azithromycin  500 MG tablet Commonly known as: ZITHROMAX  Take 1 tablet (500 mg total) by mouth daily.   cloNIDine  0.2 MG tablet Commonly known as: CATAPRES  Take 1 tablet (0.2 mg total) by mouth 2 (two) times daily. What changed:  medication strength See the new instructions.   fluticasone -salmeterol 115-21 MCG/ACT inhaler Commonly known as: Advair  HFA Inhale 2 puffs into the  lungs 2 (two) times daily.   nicotine  14 mg/24hr patch Commonly known as: NICODERM CQ  - dosed in mg/24 hours Place 1 patch (14 mg total) onto the skin daily as needed (smoking cessation).   ondansetron  4 MG disintegrating tablet Commonly known as: ZOFRAN -ODT Take 1 tablet (4 mg total) by mouth every 8 (eight) hours as needed for nausea or vomiting.   pantoprazole  40 MG tablet Commonly known as: PROTONIX  Take 1 tablet (40 mg total) by mouth daily before breakfast.   predniSONE  20 MG tablet Commonly known as: DELTASONE  Take 2 tablets (40 mg total) by mouth daily with breakfast.       Disposition and follow-up:   Christy Barnett was discharged from [facility] in Good condition.  At the hospital follow up visit please address:  1.  Patient would benefit from MAT for her OUD in addition to residential rehab, she is interested in resuming methadone    2. Patient would benefit from establishment of a formal HTN regimen  Follow-up Appointments:  Follow-up Information     [facility], [facility]. Schedule an appointment as soon as possible for a visit.   Contact information: [address] [place] [address] [phone]                 Hospital Course by problem list: Christy Barnett is a 54 y.o. person living with a history of hypertension, bipolar disorder, opioid use disorder,  GERD, history of hepatitis C, and history of syphilis who presented with intractable nausea and vomiting and admitted for opioid withdrawal and COPD exacerbation,  now being discharged on hospital day 1 with the following pertinent hospital course:   Opioid Withdrawal Opioid use disorder Intractable Nausea and Vomiting Patient presented to the ED via EMS for persistent abdominal pain with nausea and emesis.  Patient was diaphoretic, hypertensive with SBP's to 180s, and tachycardic.  Patient received IVF, clonidine  and Ativan  in the ED. UDS was positive for fentanyl , WBC  of 13.6, hypochloremia elevated AST and an anion gap of 18.  Patient was monitored on COWS protocol for opioid withdrawal, given dicyclomine  as needed for abdominal spasms, Zofran  for nausea, placed on continuous telemetry and blood cultures obtained to rule out bacteremia.  PT and OT was also consulted to evaluate patient needs, and determined that patient did not have any acute PT/OT needs.  On reassessment, leukocytosis significantly improved, anion gap closed and AST WNL. 1/4 blood cultures drawn with staph epidermitis growth, likely a contaminant, so no additional antibiotics were needed, white count also reassuring. Patient reported much improved nausea and vomiting, with only one episode of emesis on day of discharge. Patient denied any diarrhea throughout this admission. Patient was asking for placement in behavioral health or residential rehab, [facility] was consulted for substance abuse resources, and LCSW provided patient with list of residential rehabilitations. Patient says she called various facilities but no one answered. Discussed with patient our recommendations to continue pursuing residential rehab options upon discharge, she voiced understanding.   COPD exacerbation  Chronic hypoxic respiratory failure Patient presented mildly hypoxic and low 90s which improved with O2, and with new mucopurulent sputum production, concerning for COPD exacerbation.  Physical exam did not reveal wheezing, though patient said she was not taking deep breaths to avoid coughing.  Chest x-ray was ordered to rule out any concern for CAP or infection like to be producing sputum.  Patient was placed on 2 L nasal cannula for comfort and given scheduled DuoNebs, started azithromycin  and prednisone . Patient stated she uses Breztri  at home, so this was restarted for her in the hospital.  Fill history cannot be done for Breztri , and insurance does not cover this medication without trial of either Advair , Dulera or Symbicort .   Patient was able to be weaned to room air, satting 100s with normal effort. Prescriptions for the remainder of her steroid and azithromycin  course were sent to [facility] pharmacy.   Severe Asymptomatic Hypertension Patient presented with elevated blood pressures in the ED, so her home amlodipine  and clonidine  was restarted. Her blood pressures continued to be elevated despite this, so one time dose of IV lopressor  was given with subsequent improvement in pressures. We increased patient's dose of clonidine  while she was here to optimize her pressures, and sent her with a three day taper of that dose, with instruction to resume her home dose after three days and follow up with her PCP.    Stable chronic medical conditions: GERD - continued home pantoprazole  Tobacco use disorder - continued nicotine  patch and provided tobacco cessation counseling resources  Discharge Exam:   BP (!) 174/95 (BP Location: Left Arm)   Pulse (!) 106   Temp 98.8 F (37.1 C) (Oral)   Resp 18   Ht 5' 1 (1.549 m)   Wt 77.1 kg   SpO2 100%   BMI 32.12 kg/m  Discharge exam:  Constitutional:      General: She is not in  acute distress, standing up speaking on the phone    Appearance: She is not toxic-appearing.  Cardiovascular:     Rate and Rhythm: Regular rate and rhythm.  Pulmonary:     Effort: Pulmonary effort is normal.     Breath sounds: CTAB Abdominal:     General: Abdomen is flat. There is no distension.  Neurological:     Mental Status: She is alert  Pertinent Labs, Studies, and Procedures:      Latest Ref Rng & Units 02/15/2024    4:37 AM 02/14/2024   12:10 AM 02/13/2024    1:33 PM  CBC  WBC 4.0 - 10.5 K/uL 7.3  10.1     Hemoglobin 12.0 - 15.0 g/dL 86.3  86.6  84.6   Hematocrit 36.0 - 46.0 % 40.6  39.8  45.0   Platelets 150 - 400 K/uL 328  327             Latest Ref Rng & Units 02/15/2024    4:37 AM 02/14/2024   12:10 AM 02/13/2024    1:33 PM  CMP  Glucose 70 - 99 mg/dL 99  853  894   BUN 6 - 20  mg/dL 8  5  7    Creatinine 0.44 - 1.00 mg/dL 9.39  9.41  9.39   Sodium 135 - 145 mmol/L 141  140  137   Potassium 3.5 - 5.1 mmol/L 3.5  3.3  4.3   Chloride 98 - 111 mmol/L 100  98  96   CO2 22 - 32 mmol/L 30  30     Calcium 8.9 - 10.3 mg/dL 9.0  8.9     Total Protein 6.5 - 8.1 g/dL 6.7  6.7     Total Bilirubin 0.0 - 1.2 mg/dL 0.5  0.7     Alkaline Phos 38 - 126 U/L 114  150     AST 15 - 41 U/L 32  84     ALT 0 - 44 U/L 29  40         DG CHEST PORT 1 VIEW Result Date: 02/14/2024 CLINICAL DATA:  Abdominal pain, cough. EXAM: PORTABLE CHEST 1 VIEW COMPARISON:  01/13/2024, CT chest 12/28/2023. FINDINGS: Trachea is midline. Heart size stable. Lungs are clear. No pleural fluid. IMPRESSION: No acute findings. Electronically Signed   By: Newell Eke M.D.   On: 02/14/2024 10:32   Discharge Instructions: Thank you for allowing us  to be part of your care. You were hospitalized for opioid withdrawals and COPD exacerbation. We treated you with breathing treatments, IV fluid repleation and antiemetics.  See the changes in your medications and management of your chronic conditions below:  *For your COPD -We have STARTED you on these following medications:  - Azithromycin  daily, 1 day remaining  - Prednisone  40 mg daily, 4 days remaining  - Advair  2 puffs daily  *For your HTN -We have STARTED you on these following medications:  - Amlodipine  5 mg daily  - Clonidine  0.2 mg twice a day for 3 days  - clonidine  0.1 mg twice a day after completing 3 day taper   *For your nausea -We have STARTED you on these following medications:  - Zofran  ODT 4 mg every 8 hours as needed  FOLLOW UP APPOINTMENTS: Please visit your PCP in 7-14 days, or after completing residential rehabilitation   Please call your PCP or our clinic if you have any questions or concerns, we may be able to help and keep you from a  long and expensive emergency room wait. Our clinic and after hours phone number is [phone]. The  best time to call is Monday through Friday 9 am to 4 pm but there is always someone available 24/7 if you have an emergency. If you need medication refills please notify your pharmacy one week in advance and they will send us  a request.   We are glad you are feeling better,  Signed: Idelle Nakai, DO 02/15/2024, 11:45 AM   "

## 2024-02-15 NOTE — Discharge Instructions (Addendum)
 Thank you for allowing us  to be part of your care. You were hospitalized for opioid withdrawals and COPD exacerbation. We treated you with breathing treatments, IV fluid repleation and antiemetics.  See the changes in your medications and management of your chronic conditions below:  *For your COPD -We have STARTED you on these following medications:  - Azithromycin  daily, 1 day remaining  - Prednisone  40 mg daily, 4 days remaining  - Advair  2 puffs daily  *For your HTN -We have STARTED you on these following medications:  - Amlodipine  5 mg daily  - Clonidine  0.2 mg twice a day for 3 days  - clonidine  0.1 mg twice a day after completing 3 day taper   *For your nausea -We have STARTED you on these following medications:  - Zofran  ODT 4 mg every 8 hours as needed  FOLLOW UP APPOINTMENTS: Please visit your PCP in 7-14 days, or after completing residential rehabilitation   Please call your PCP or our clinic if you have any questions or concerns, we may be able to help and keep you from a long and expensive emergency room wait. Our clinic and after hours phone number is [phone]. The best time to call is Monday through Friday 9 am to 4 pm but there is always someone available 24/7 if you have an emergency. If you need medication refills please notify your pharmacy one week in advance and they will send us  a request.   We are glad you are feeling better,  Alfornia Light, DO Internal Medicine Inpatient Teaching Service at [facility]

## 2024-02-15 NOTE — Plan of Care (Signed)

## 2024-02-15 NOTE — Plan of Care (Signed)

## 2024-02-16 LAB — CULTURE, BLOOD (ROUTINE X 2): Special Requests: ADEQUATE

## 2024-02-19 ENCOUNTER — Other Ambulatory Visit: Payer: Self-pay | Admitting: Nurse Practitioner

## 2024-02-20 LAB — CULTURE, BLOOD (ROUTINE X 2): Special Requests: ADEQUATE
